# Patient Record
Sex: Female | Born: 1960
Health system: Southern US, Community
[De-identification: ages and names within clinical notes are randomized; demographics above are authoritative.]

## PROBLEM LIST (undated history)

## (undated) DIAGNOSIS — C539 Malignant neoplasm of cervix uteri, unspecified: Secondary | ICD-10-CM

## (undated) DIAGNOSIS — E039 Hypothyroidism, unspecified: Secondary | ICD-10-CM

## (undated) DIAGNOSIS — N200 Calculus of kidney: Secondary | ICD-10-CM

## (undated) DIAGNOSIS — K635 Polyp of colon: Secondary | ICD-10-CM

## (undated) DIAGNOSIS — F411 Generalized anxiety disorder: Secondary | ICD-10-CM

## (undated) DIAGNOSIS — K219 Gastro-esophageal reflux disease without esophagitis: Secondary | ICD-10-CM

## (undated) DIAGNOSIS — J301 Allergic rhinitis due to pollen: Secondary | ICD-10-CM

## (undated) DIAGNOSIS — J4540 Moderate persistent asthma, uncomplicated: Secondary | ICD-10-CM

## (undated) DIAGNOSIS — G4700 Insomnia, unspecified: Secondary | ICD-10-CM

## (undated) DIAGNOSIS — F1290 Cannabis use, unspecified, uncomplicated: Secondary | ICD-10-CM

## (undated) DIAGNOSIS — F17200 Nicotine dependence, unspecified, uncomplicated: Secondary | ICD-10-CM

## (undated) DIAGNOSIS — K589 Irritable bowel syndrome without diarrhea: Secondary | ICD-10-CM

## (undated) DIAGNOSIS — L409 Psoriasis, unspecified: Secondary | ICD-10-CM

## (undated) DIAGNOSIS — K2900 Acute gastritis without bleeding: Secondary | ICD-10-CM

## (undated) DIAGNOSIS — I10 Essential (primary) hypertension: Secondary | ICD-10-CM

## (undated) HISTORY — DX: Moderate persistent asthma, uncomplicated: J45.40

## (undated) HISTORY — DX: Allergic rhinitis due to pollen: J30.1

## (undated) HISTORY — DX: Psoriasis, unspecified: L40.9

## (undated) HISTORY — DX: Essential (primary) hypertension: I10

## (undated) HISTORY — PX: APPENDECTOMY: SHX54

## (undated) HISTORY — DX: Insomnia, unspecified: G47.00

## (undated) HISTORY — DX: Polyp of colon: K63.5

## (undated) HISTORY — DX: Gastro-esophageal reflux disease without esophagitis: K21.9

## (undated) HISTORY — DX: Nicotine dependence, unspecified, uncomplicated: F17.200

## (undated) HISTORY — PX: KNEE SURGERY: SHX244

## (undated) HISTORY — PX: TUBAL LIGATION: SHX77

## (undated) HISTORY — DX: Irritable bowel syndrome, unspecified: K58.9

## (undated) HISTORY — DX: Malignant neoplasm of cervix uteri, unspecified: C53.9

## (undated) HISTORY — PX: OTHER SURGICAL HISTORY: SHX169

## (undated) HISTORY — DX: Generalized anxiety disorder: F41.1

## (undated) HISTORY — DX: Cannabis use, unspecified, uncomplicated: F12.90

## (undated) HISTORY — DX: Acute gastritis without bleeding: K29.00

## (undated) HISTORY — PX: TONSILLECTOMY: SUR1361

## (undated) HISTORY — DX: Calculus of kidney: N20.0

---

## 1998-03-17 ENCOUNTER — Ambulatory Visit (HOSPITAL_COMMUNITY): Admission: RE | Admit: 1998-03-17 | Discharge: 1998-03-17 | Payer: Self-pay | Admitting: Obstetrics and Gynecology

## 2000-11-27 ENCOUNTER — Other Ambulatory Visit: Admission: RE | Admit: 2000-11-27 | Discharge: 2000-11-27 | Payer: Self-pay | Admitting: Obstetrics and Gynecology

## 2000-12-25 ENCOUNTER — Encounter (INDEPENDENT_AMBULATORY_CARE_PROVIDER_SITE_OTHER): Payer: Self-pay | Admitting: *Deleted

## 2000-12-25 ENCOUNTER — Inpatient Hospital Stay (HOSPITAL_COMMUNITY): Admission: RE | Admit: 2000-12-25 | Discharge: 2000-12-28 | Payer: Self-pay | Admitting: Obstetrics and Gynecology

## 2001-11-05 ENCOUNTER — Emergency Department (HOSPITAL_COMMUNITY): Admission: EM | Admit: 2001-11-05 | Discharge: 2001-11-05 | Payer: Self-pay | Admitting: Emergency Medicine

## 2002-04-08 ENCOUNTER — Encounter: Payer: Self-pay | Admitting: Emergency Medicine

## 2002-04-08 ENCOUNTER — Emergency Department (HOSPITAL_COMMUNITY): Admission: EM | Admit: 2002-04-08 | Discharge: 2002-04-08 | Payer: Self-pay

## 2002-08-12 ENCOUNTER — Emergency Department (HOSPITAL_COMMUNITY): Admission: EM | Admit: 2002-08-12 | Discharge: 2002-08-12 | Payer: Self-pay | Admitting: Emergency Medicine

## 2002-08-16 ENCOUNTER — Emergency Department (HOSPITAL_COMMUNITY): Admission: AD | Admit: 2002-08-16 | Discharge: 2002-08-16 | Payer: Self-pay | Admitting: Emergency Medicine

## 2002-08-17 ENCOUNTER — Emergency Department (HOSPITAL_COMMUNITY): Admission: EM | Admit: 2002-08-17 | Discharge: 2002-08-17 | Payer: Self-pay | Admitting: Emergency Medicine

## 2002-08-25 ENCOUNTER — Emergency Department (HOSPITAL_COMMUNITY): Admission: EM | Admit: 2002-08-25 | Discharge: 2002-08-25 | Payer: Self-pay | Admitting: Emergency Medicine

## 2002-10-06 ENCOUNTER — Emergency Department (HOSPITAL_COMMUNITY): Admission: EM | Admit: 2002-10-06 | Discharge: 2002-10-06 | Payer: Self-pay | Admitting: Emergency Medicine

## 2002-10-10 ENCOUNTER — Emergency Department (HOSPITAL_COMMUNITY): Admission: AD | Admit: 2002-10-10 | Discharge: 2002-10-10 | Payer: Self-pay | Admitting: Emergency Medicine

## 2002-10-12 ENCOUNTER — Emergency Department (HOSPITAL_COMMUNITY): Admission: EM | Admit: 2002-10-12 | Discharge: 2002-10-12 | Payer: Self-pay | Admitting: Emergency Medicine

## 2003-02-18 ENCOUNTER — Emergency Department (HOSPITAL_COMMUNITY): Admission: EM | Admit: 2003-02-18 | Discharge: 2003-02-18 | Payer: Self-pay | Admitting: Emergency Medicine

## 2003-09-19 ENCOUNTER — Emergency Department (HOSPITAL_COMMUNITY): Admission: EM | Admit: 2003-09-19 | Discharge: 2003-09-19 | Payer: Self-pay | Admitting: Emergency Medicine

## 2005-11-15 IMAGING — CR DG CHEST 2V
2 series · 2 of 2 positions shown · non-contrast
Comparison: none

HISTORY: Left chest and epigastric pain, dyspnea, smoker

CHEST 2 VIEWS:
No prior exam available for comparison.
Normal heart size, mediastinal contours, and pulmonary vascularity.
Lungs clear.
No pleural effusion or pneumothorax.
No acute bone abnormalities.
Question mild bony demineralization.

[w chest pa]
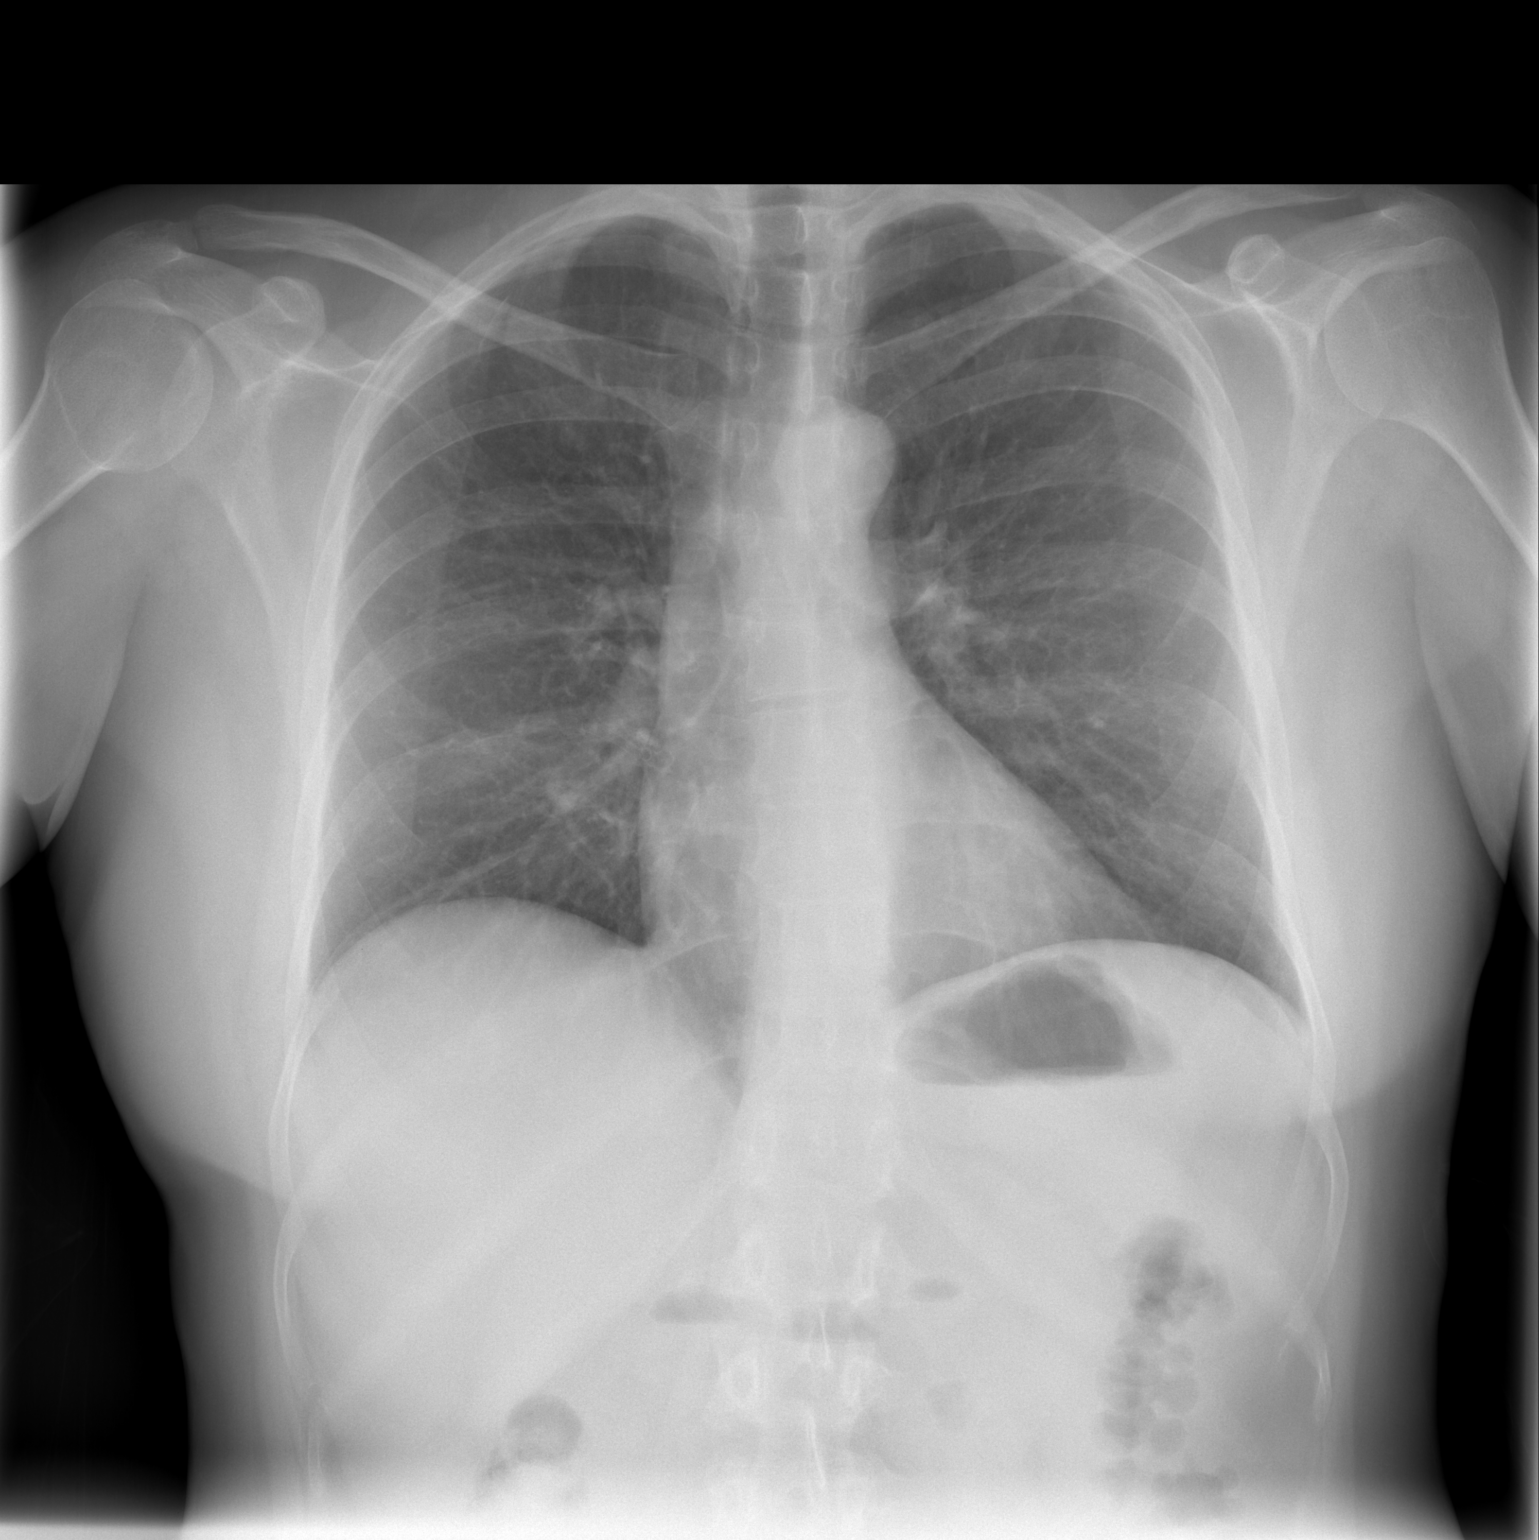

[w chest lat]
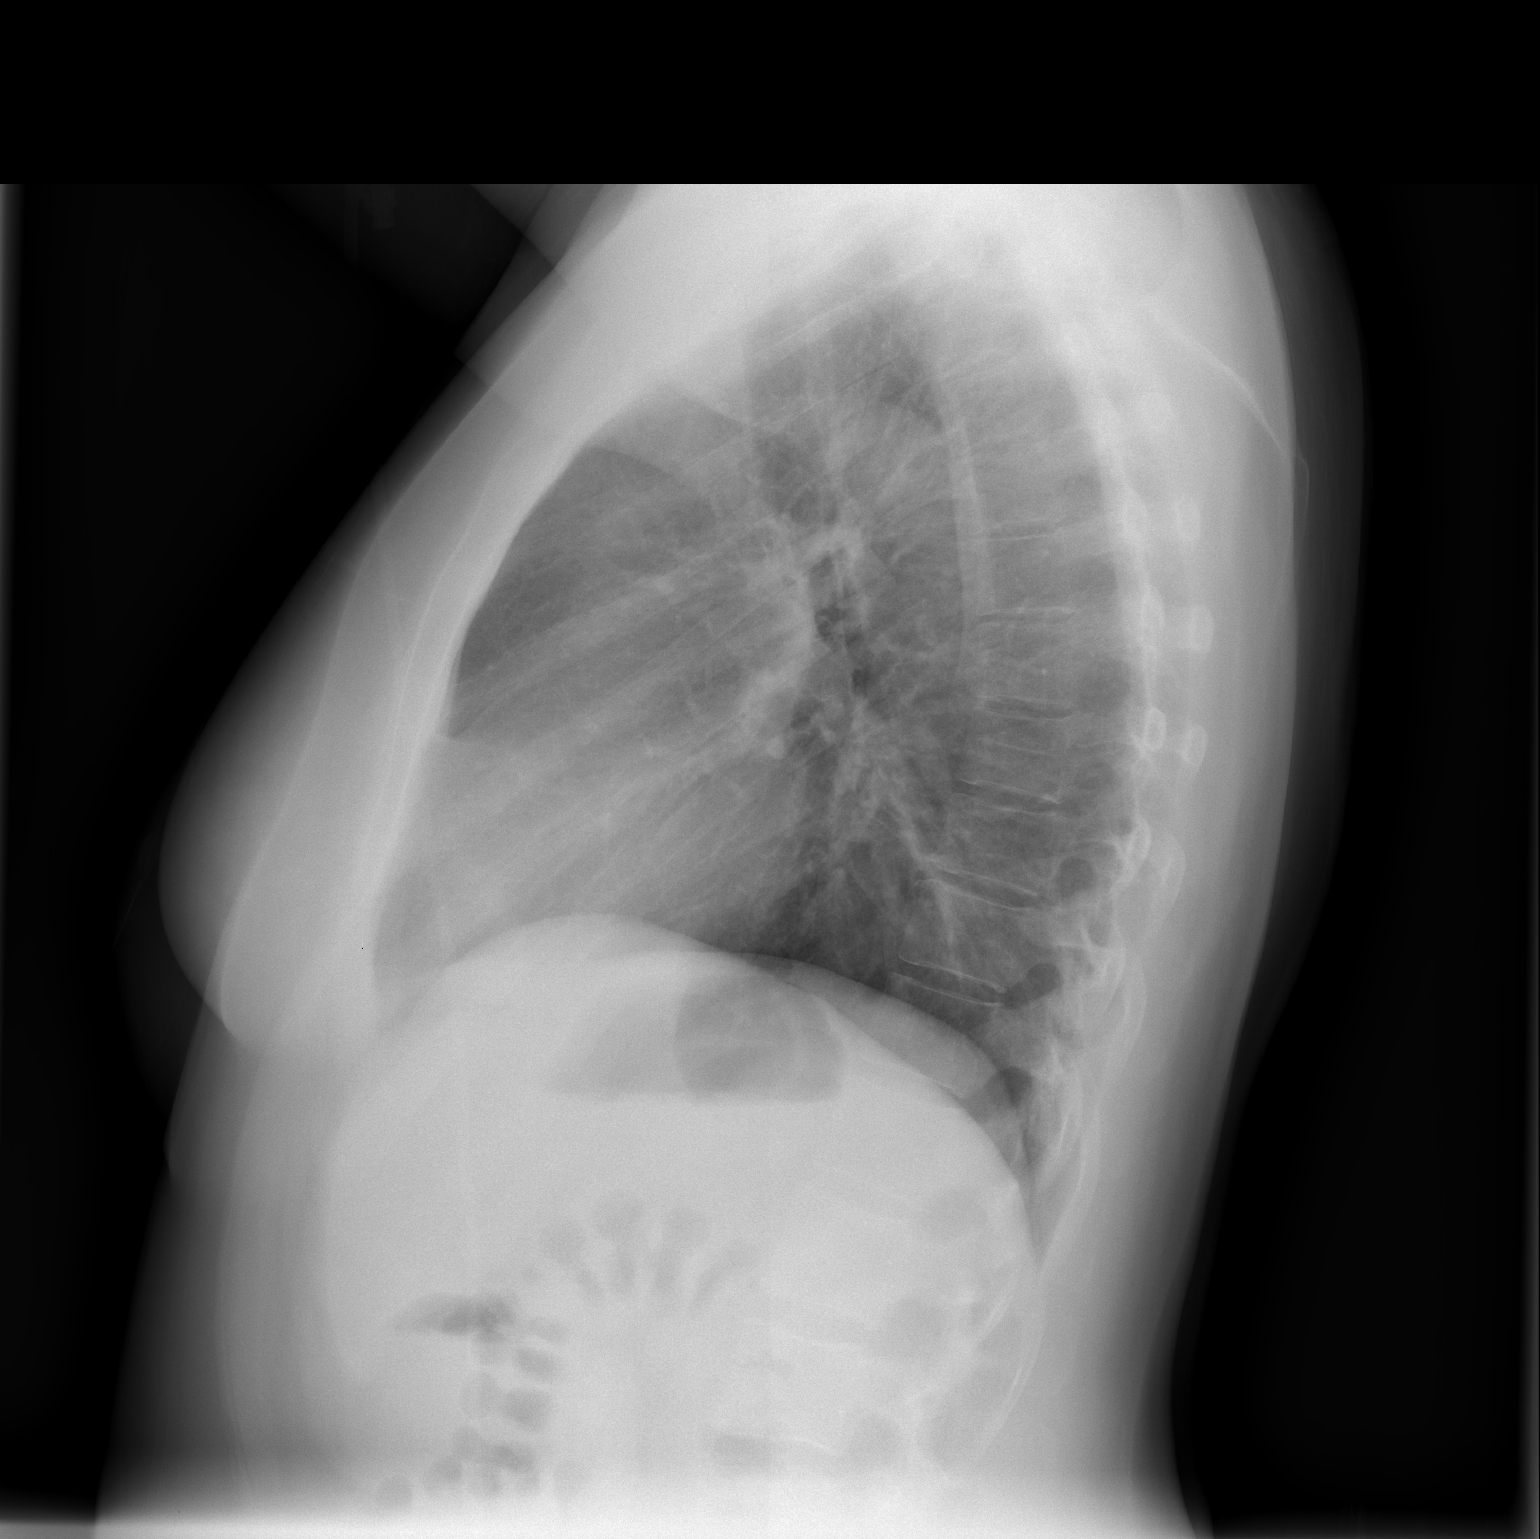

[2 of 2 positions shown; findings below may reference images not displayed]

IMPRESSION: No acute abnormalities.

## 2006-09-23 ENCOUNTER — Observation Stay (HOSPITAL_COMMUNITY): Admission: EM | Admit: 2006-09-23 | Discharge: 2006-09-23 | Payer: Self-pay | Admitting: Emergency Medicine

## 2006-11-24 ENCOUNTER — Emergency Department (HOSPITAL_COMMUNITY): Admission: EM | Admit: 2006-11-24 | Discharge: 2006-11-24 | Payer: Self-pay | Admitting: Emergency Medicine
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
## 2007-03-17 ENCOUNTER — Emergency Department (HOSPITAL_COMMUNITY): Admission: EM | Admit: 2007-03-17 | Discharge: 2007-03-17 | Payer: Self-pay | Admitting: Family Medicine

## 2008-05-09 ENCOUNTER — Emergency Department (HOSPITAL_COMMUNITY): Admission: EM | Admit: 2008-05-09 | Discharge: 2008-05-09 | Payer: Self-pay | Admitting: Emergency Medicine

## 2008-05-09 IMAGING — CT CT ABDOMEN W/ CM
2 of 5 series · 17 of 46 positions shown, 19 images · IV contrast (water/omni  & 100 ML OMNI 300)
Comparison: [DATE] by report only

CT ABDOMEN

CLINICAL DATA: Abdominal pain, diarrhea

CT ABDOMEN AND PELVIS WITH CONTRAST
TECHNIQUE: Multidetector CT imaging of the abdomen and pelvis was
performed using the standard protocol following bolus
administration of intravenous contrast.
Contrast: 100 ml [9H] IV

[Series 2: routine abdomen · axial · 0.84mm/px · z∈[-470,-96]mm · 14 of 85 slices shown, 16 images]
[im 5/85  soft-tissue]
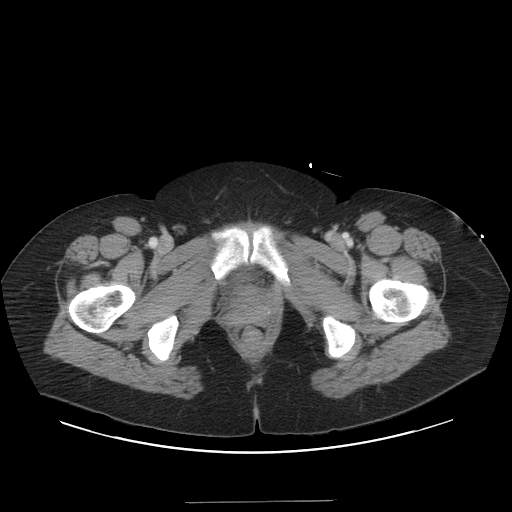
[im 5/85  bone]
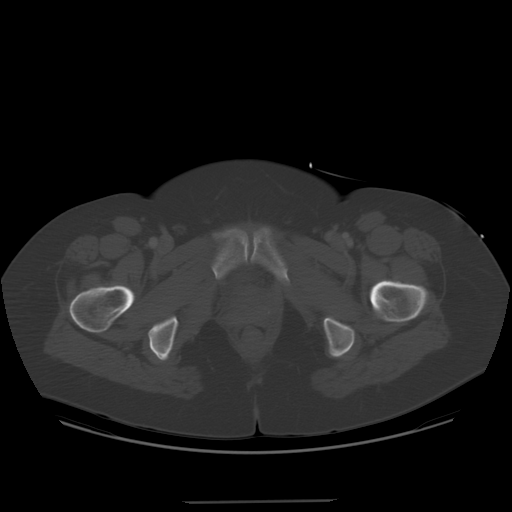
[im 10/85  soft-tissue]
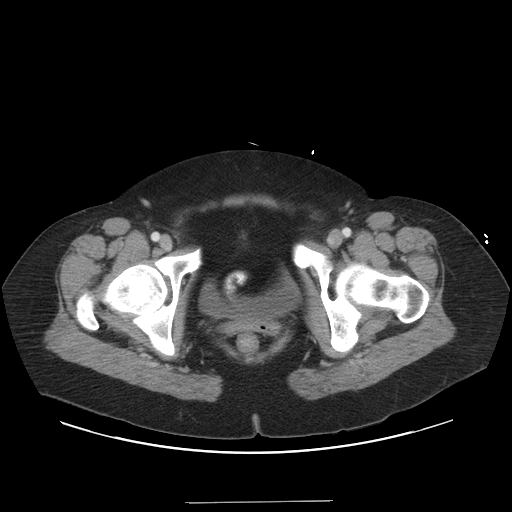
[im 19/85  soft-tissue]
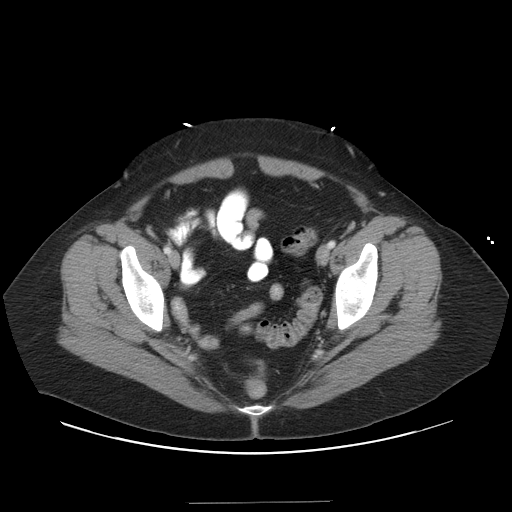
[im 24/85  soft-tissue]
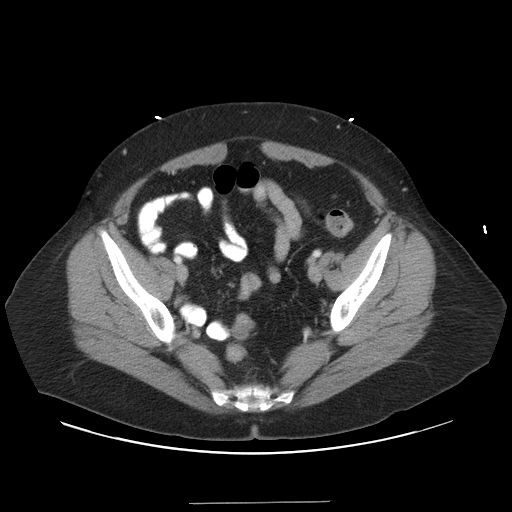
[im 29/85  soft-tissue]
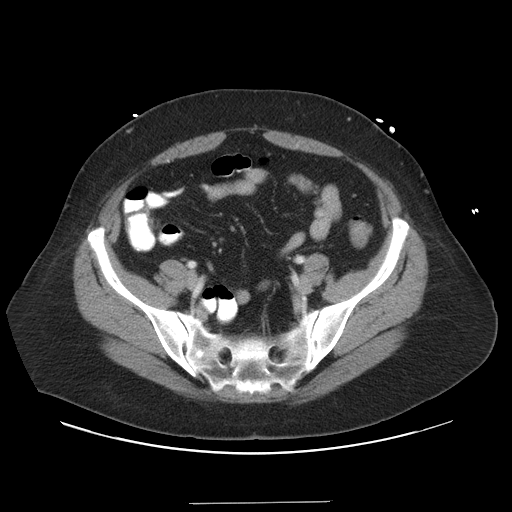
[im 33/85  soft-tissue]
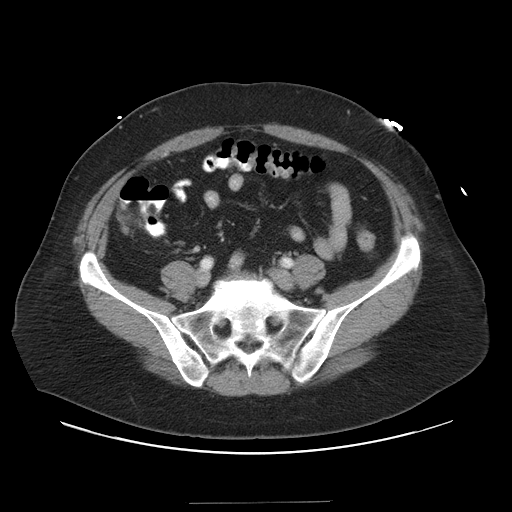
[im 38/85  soft-tissue]
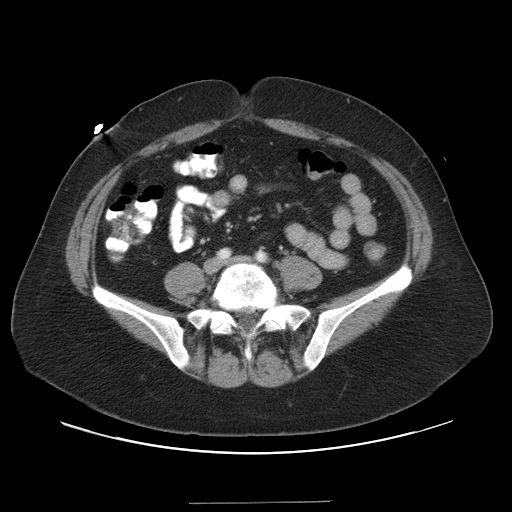
[im 47/85  soft-tissue]
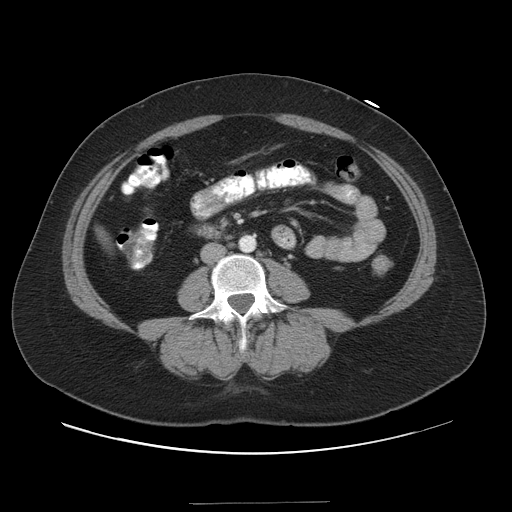
[im 52/85  soft-tissue]
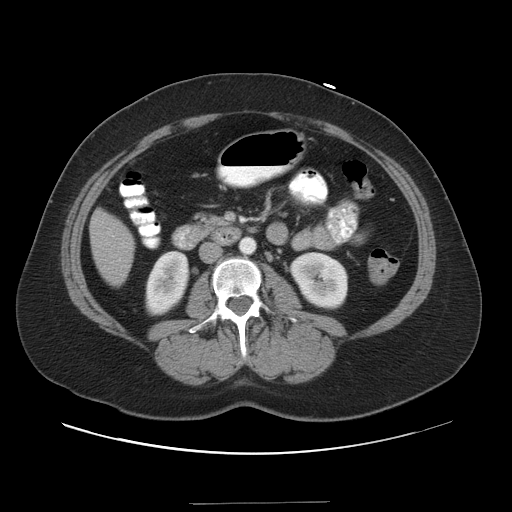
[im 52/85  bone]
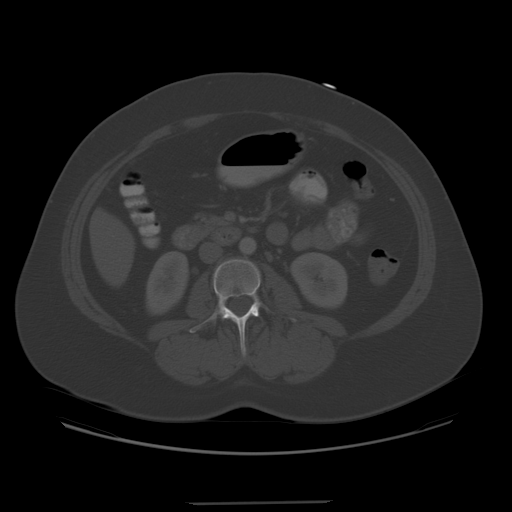
[im 57/85  soft-tissue]
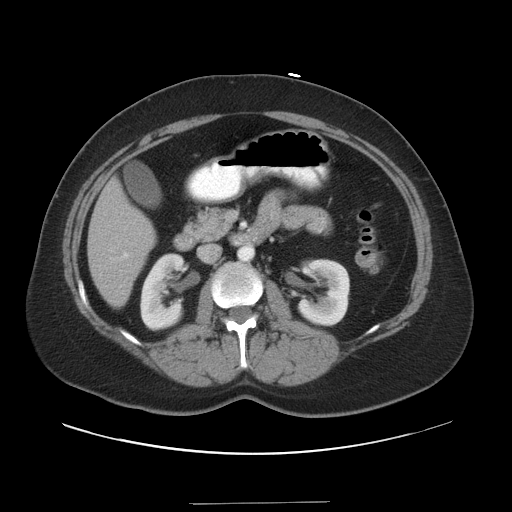
[im 61/85  soft-tissue]
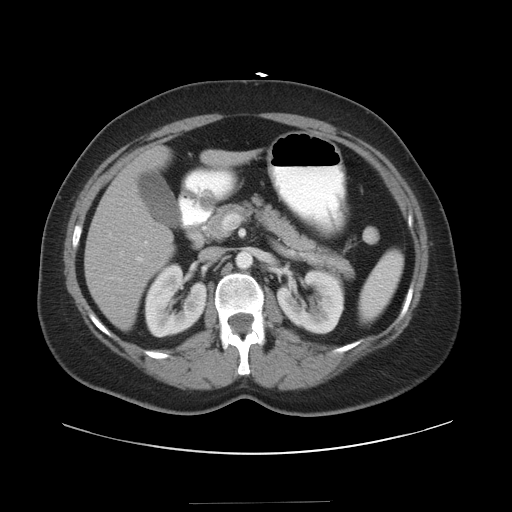
[im 66/85  soft-tissue]
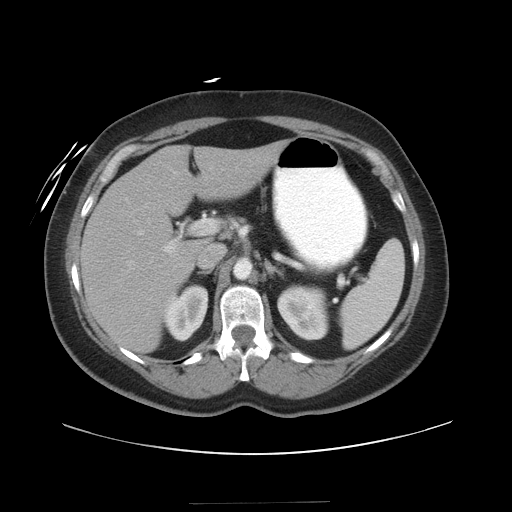
[im 75/85  soft-tissue]
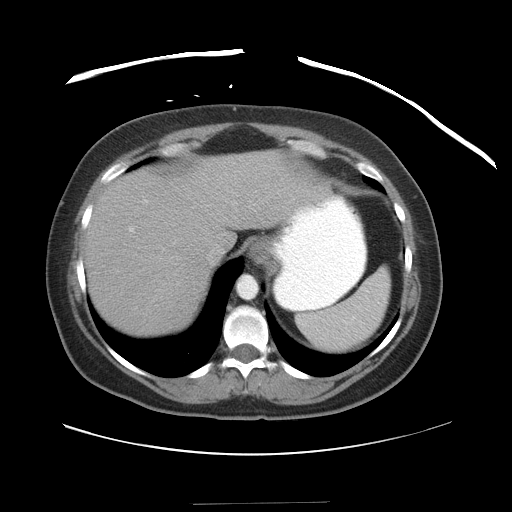
[im 80/85  soft-tissue]
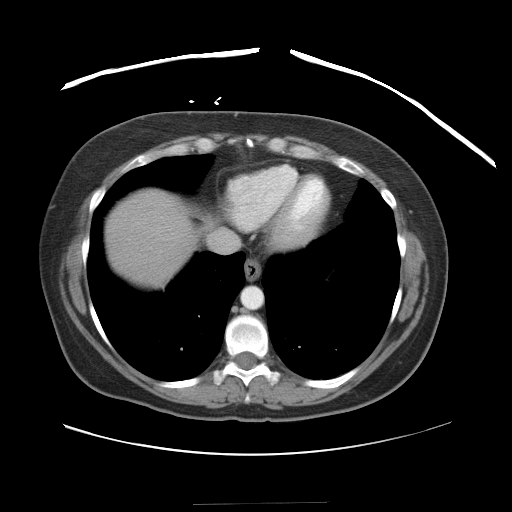

[Series 401: coronal · coronal · 0.84mm/px · 3 of 88 slices shown]
[im 30/88  soft-tissue]
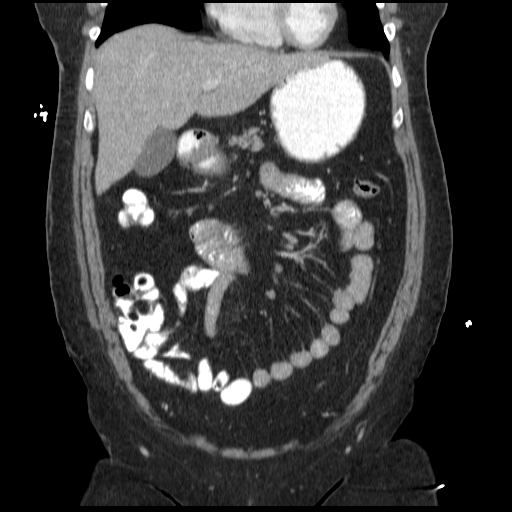
[im 39/88  soft-tissue]
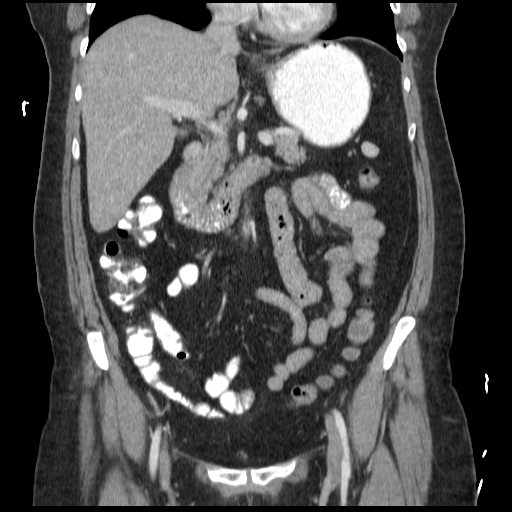
[im 49/88  soft-tissue]
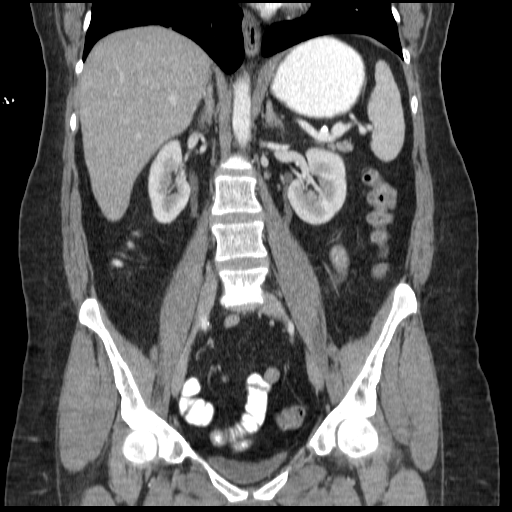

[17 of 46 positions shown; findings below may reference images not displayed]

FINDINGS: Visualized lung bases clear.  Unremarkable liver,
gallbladder, spleen and accessory splenule, adrenal glands,
kidneys, pancreas, abdominal aorta, small bowel.  A few sub
centimeter central mesenteric, left para-aortic, and aortocaval
lymph nodes.  Portal vein patent.  No free air.  No ascites.  No
hydronephrosis.  Degenerative disc disease L5-S1.
IMPRESSION: 1.  No acute abdominal process.
2.  Degenerative disc disease L5-S1.

CT PELVIS
FINDINGS: Appendix surgically absent.  The colon is nondilated,
unremarkable.  Urinary bladder incompletely distended.  Previous
hysterectomy.  No free fluid.  No adenopathy.
IMPRESSION: 1.  No acute pelvic process.

## 2008-05-12 ENCOUNTER — Emergency Department (HOSPITAL_COMMUNITY): Admission: EM | Admit: 2008-05-12 | Discharge: 2008-05-13 | Payer: Self-pay | Admitting: Emergency Medicine

## 2008-05-12 IMAGING — CR DG ABDOMEN ACUTE W/ 1V CHEST
3 series · 3 of 3 positions shown · non-contrast
Comparison: Chest radiograph [DATE]

CLINICAL DATA: Abdominal pain, nausea, vomiting

ACUTE ABDOMEN SERIES (ABDOMEN 2 VIEW & CHEST 1 VIEW)

[w chest pa]
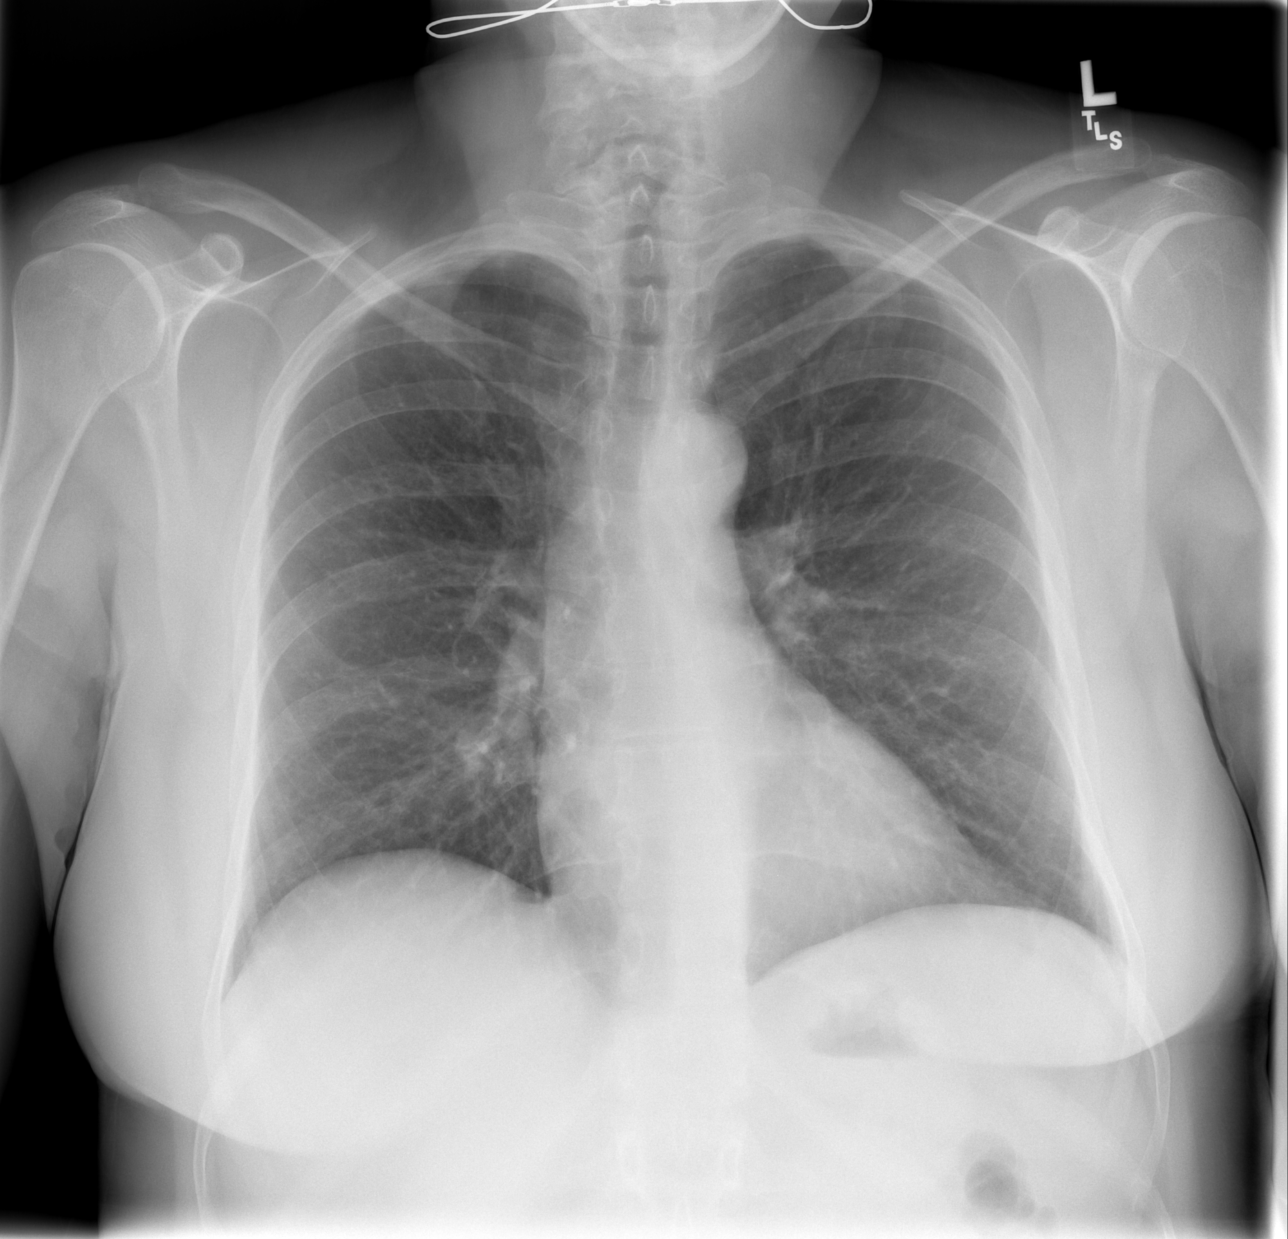

[w abdomen upright]
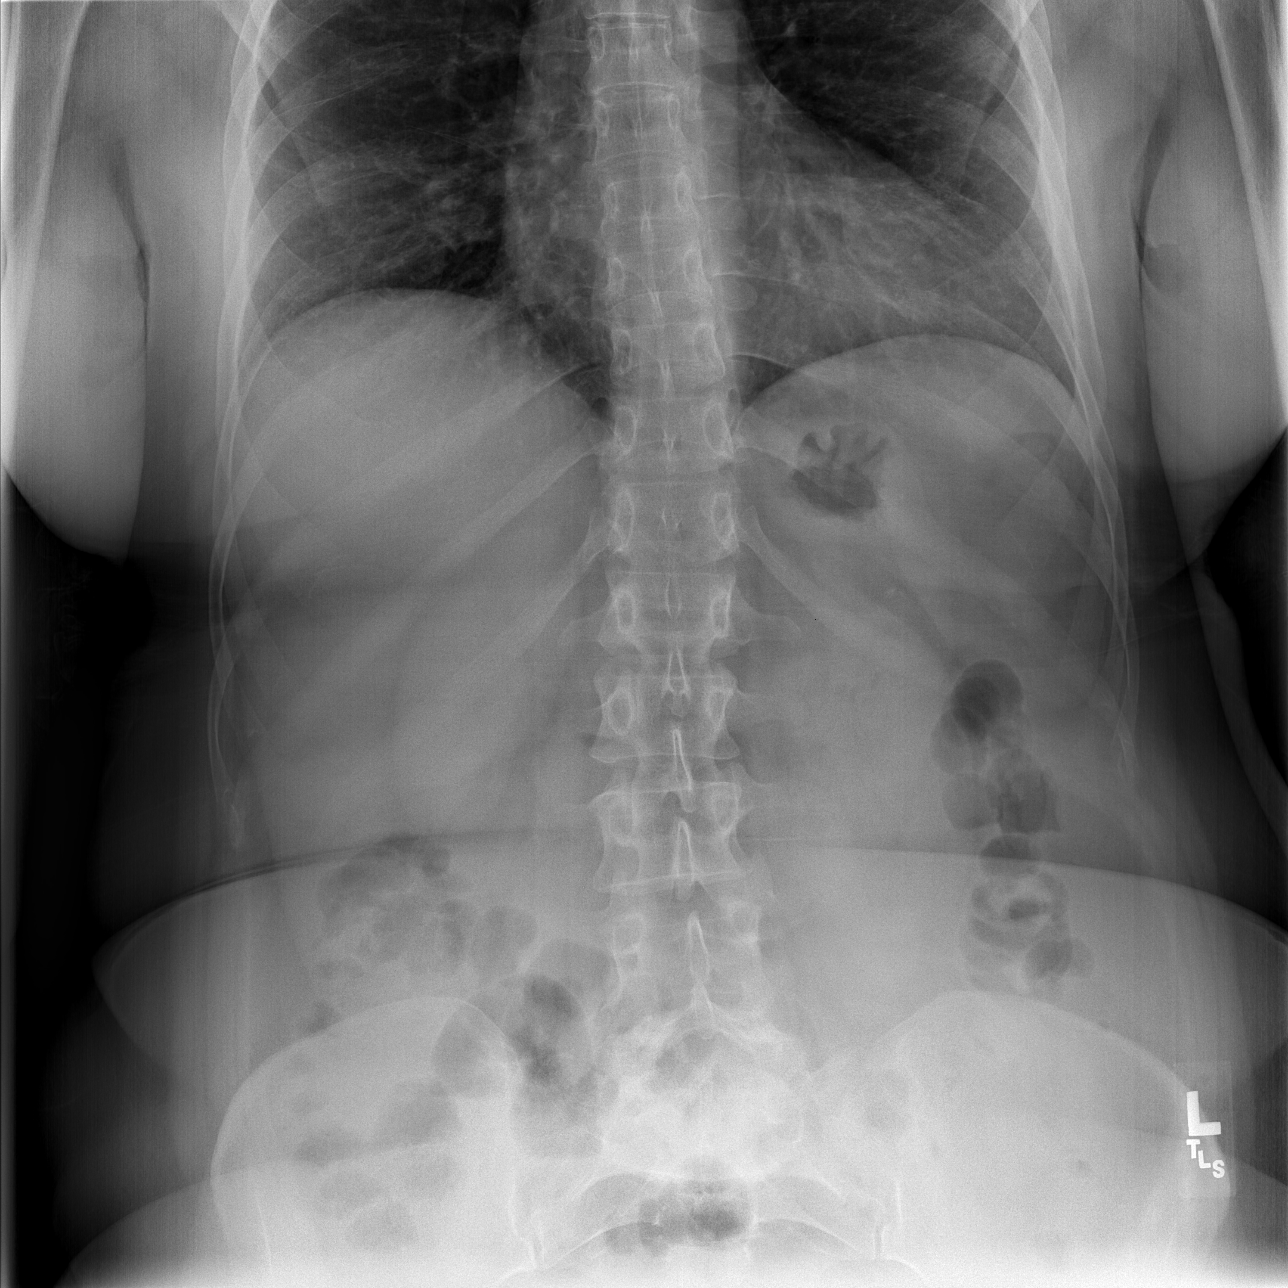

[t abdomen supine]
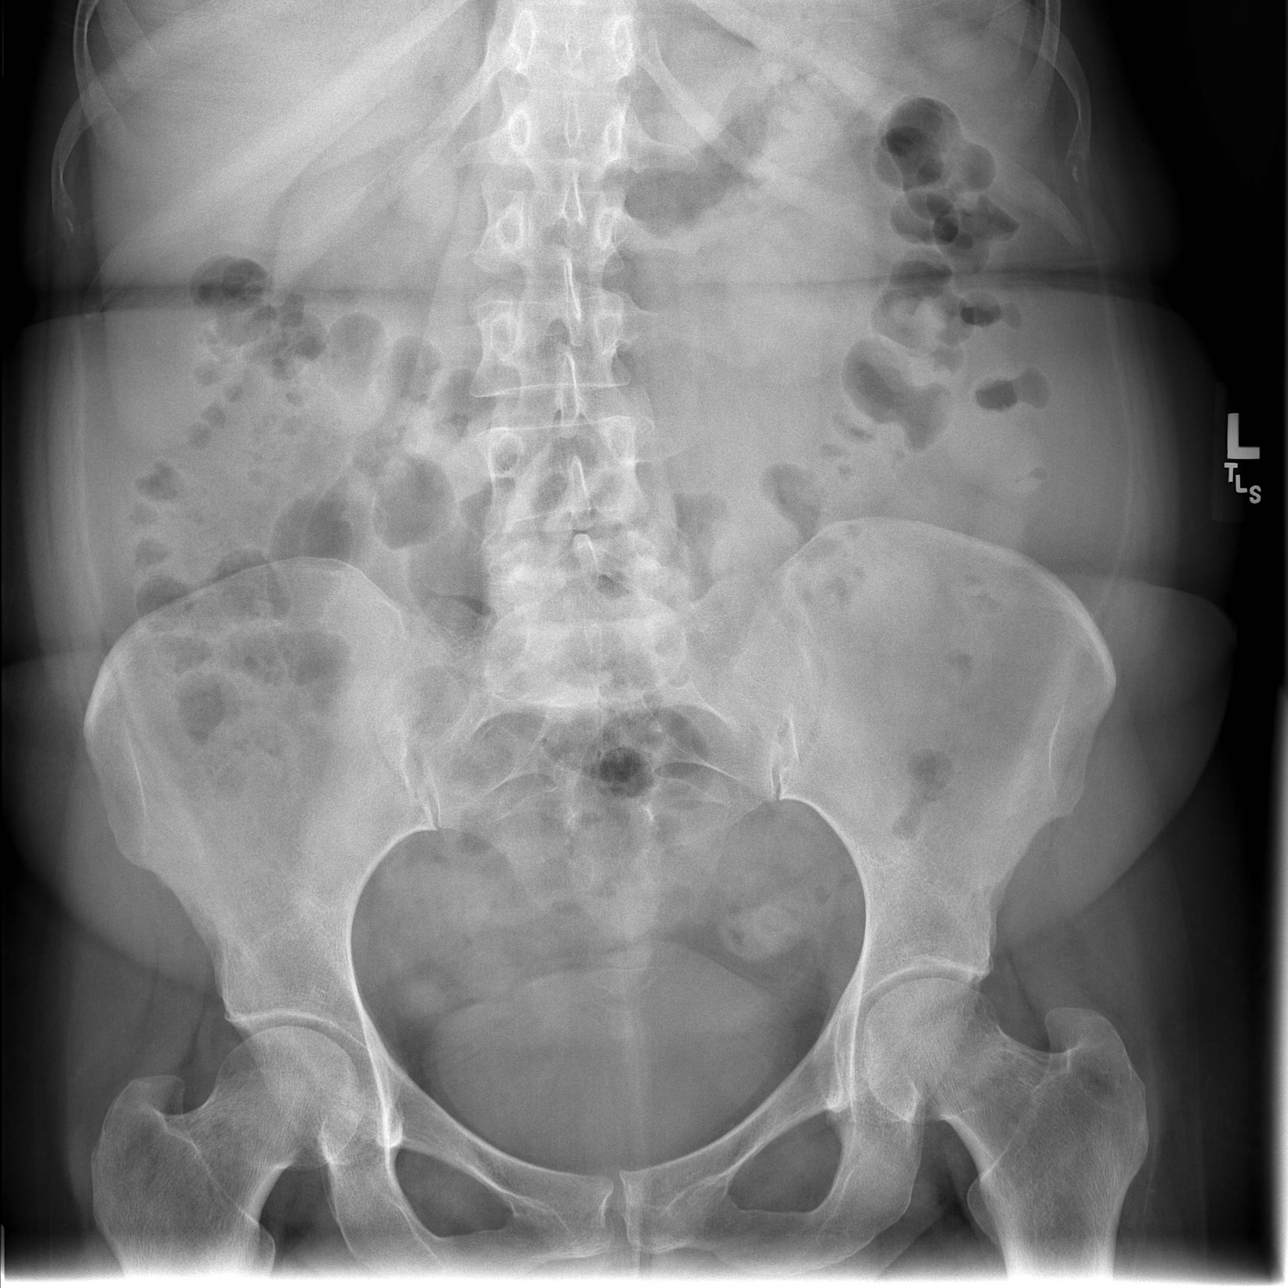

[3 of 3 positions shown; findings below may reference images not displayed]

FINDINGS: Normal heart size, mediastinal contours, and pulmonary vascularity.
Minimal chronic peribronchial thickening.
No pulmonary infiltrate or pleural effusion.
Normal bowel gas pattern.
No bowel dilatation, bowel wall thickening, or free air.
Mild bony demineralization with minimal scoliosis spine.
IMPRESSION: Minimal chronic bronchitic changes.
No acute abdominal findings.

## 2008-07-14 ENCOUNTER — Emergency Department (HOSPITAL_COMMUNITY): Admission: EM | Admit: 2008-07-14 | Discharge: 2008-07-14 | Payer: Self-pay | Admitting: Family Medicine

## 2009-02-18 HISTORY — PX: TOTAL ABDOMINAL HYSTERECTOMY W/ BILATERAL SALPINGOOPHORECTOMY: SHX83

## 2009-02-27 ENCOUNTER — Emergency Department (HOSPITAL_COMMUNITY): Admission: EM | Admit: 2009-02-27 | Discharge: 2009-02-27 | Payer: Self-pay | Admitting: Emergency Medicine

## 2009-02-27 IMAGING — CR DG KNEE COMPLETE 4+V*L*
4 series · 4 of 4 positions shown · non-contrast
Comparison: None

CLINICAL DATA: Pain without trauma

LEFT KNEE - COMPLETE 4+ VIEW

[t knee ap left]
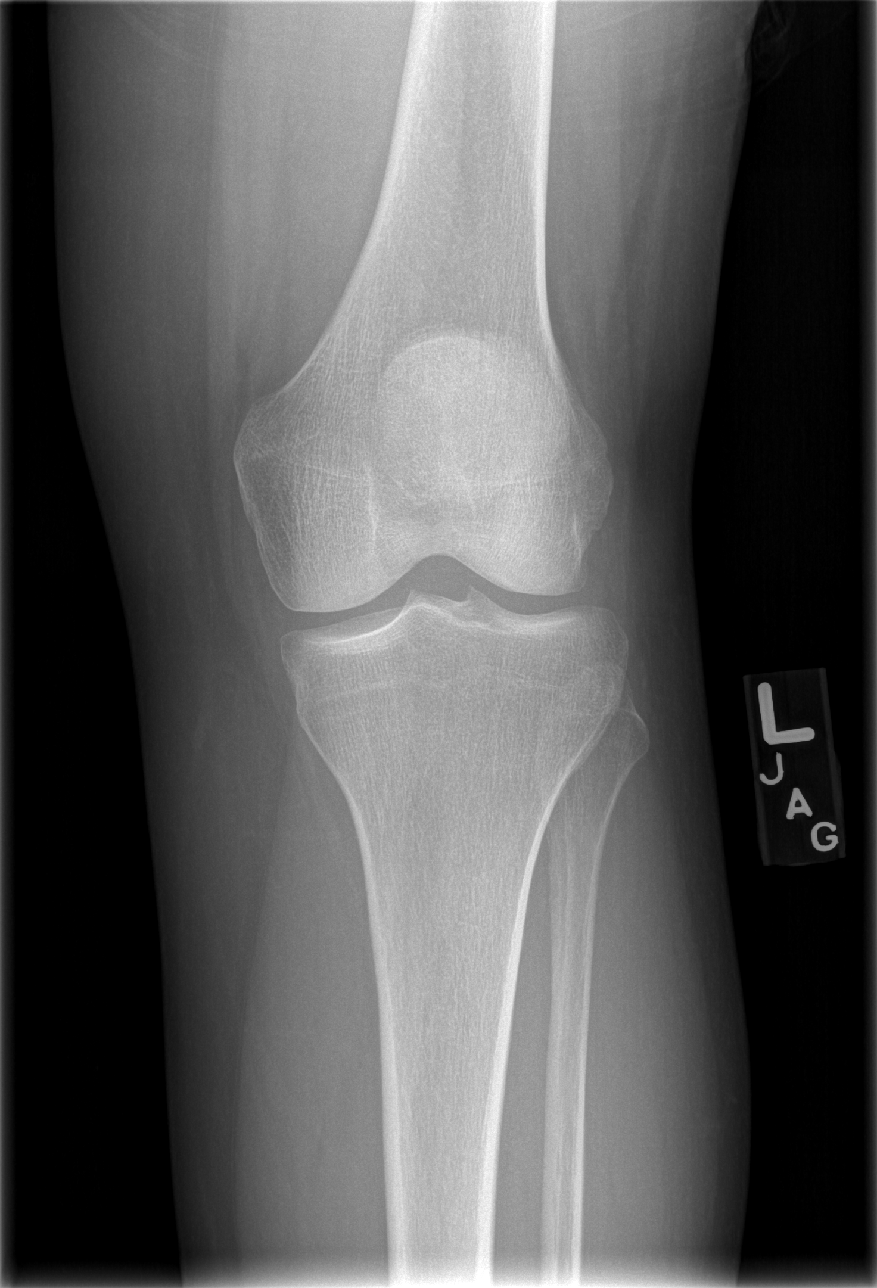

[t knee oblique left (1 of 2)]
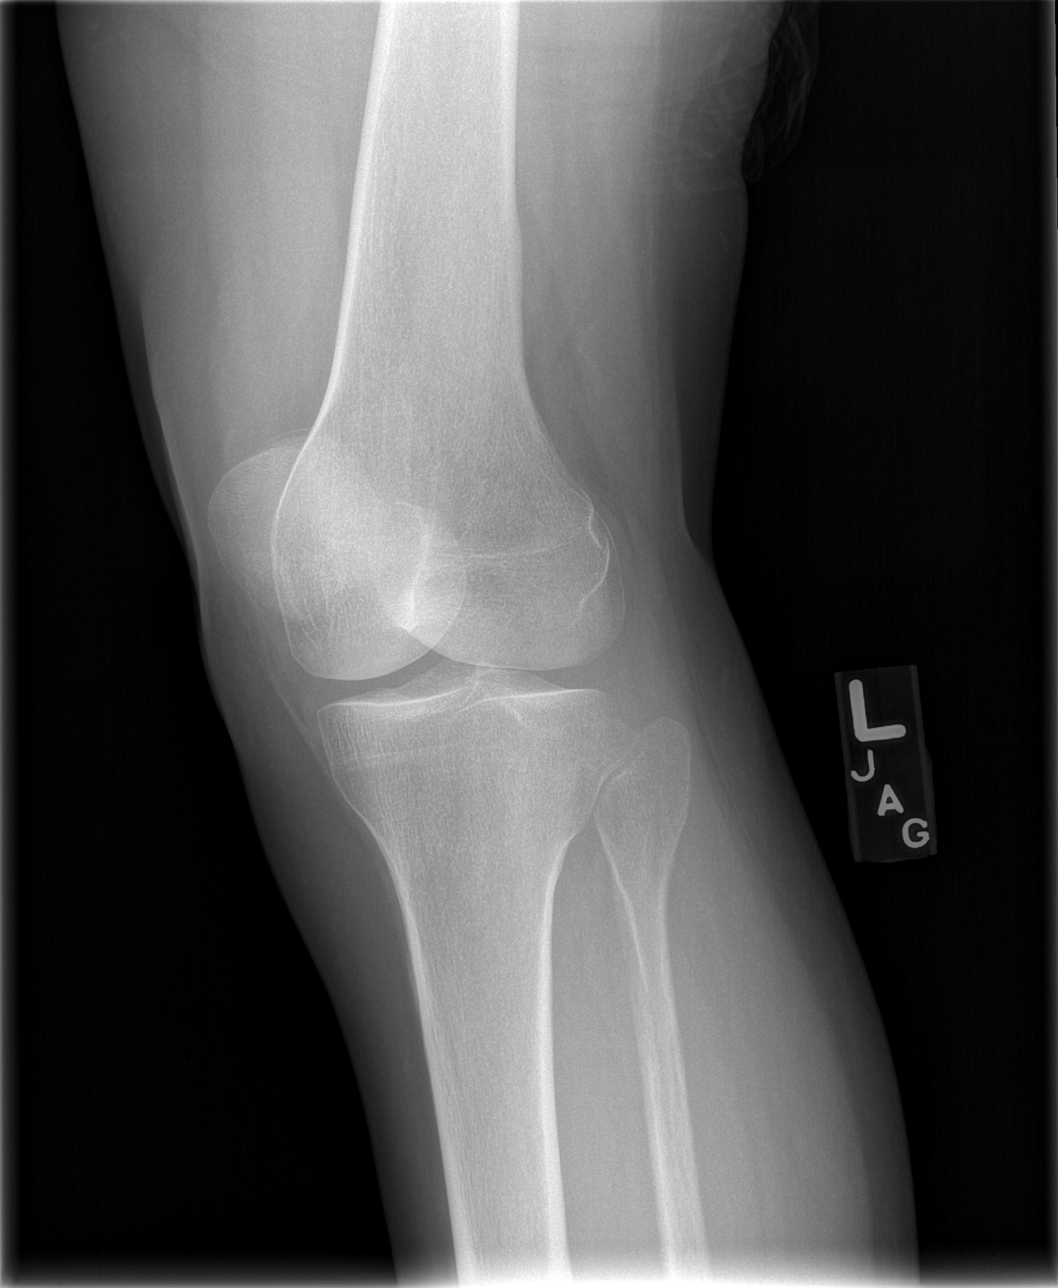

[t knee oblique left (2 of 2)]
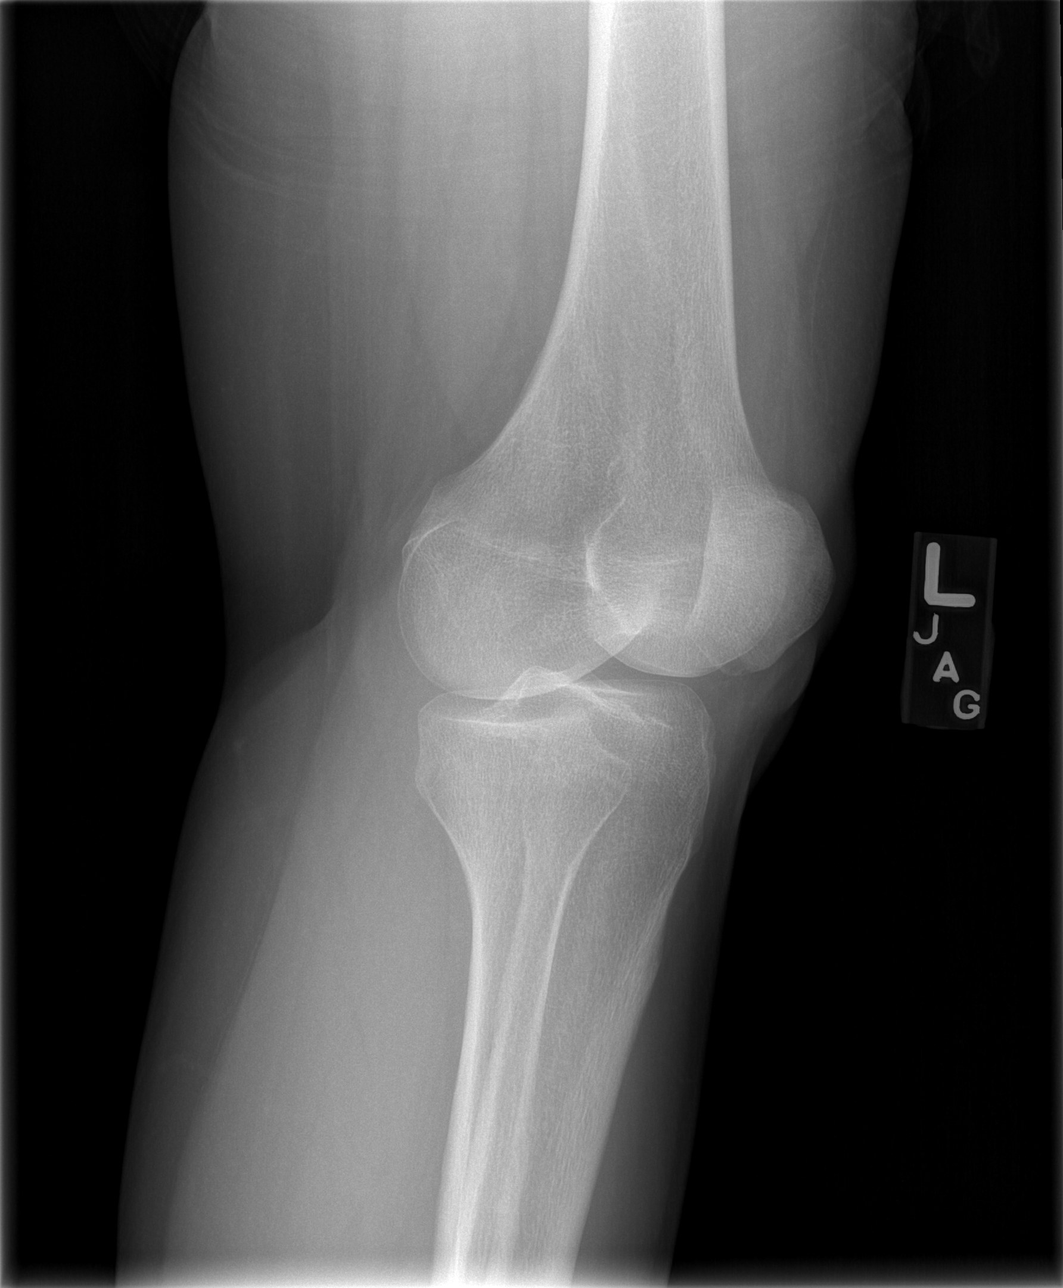

[t knee lat left]
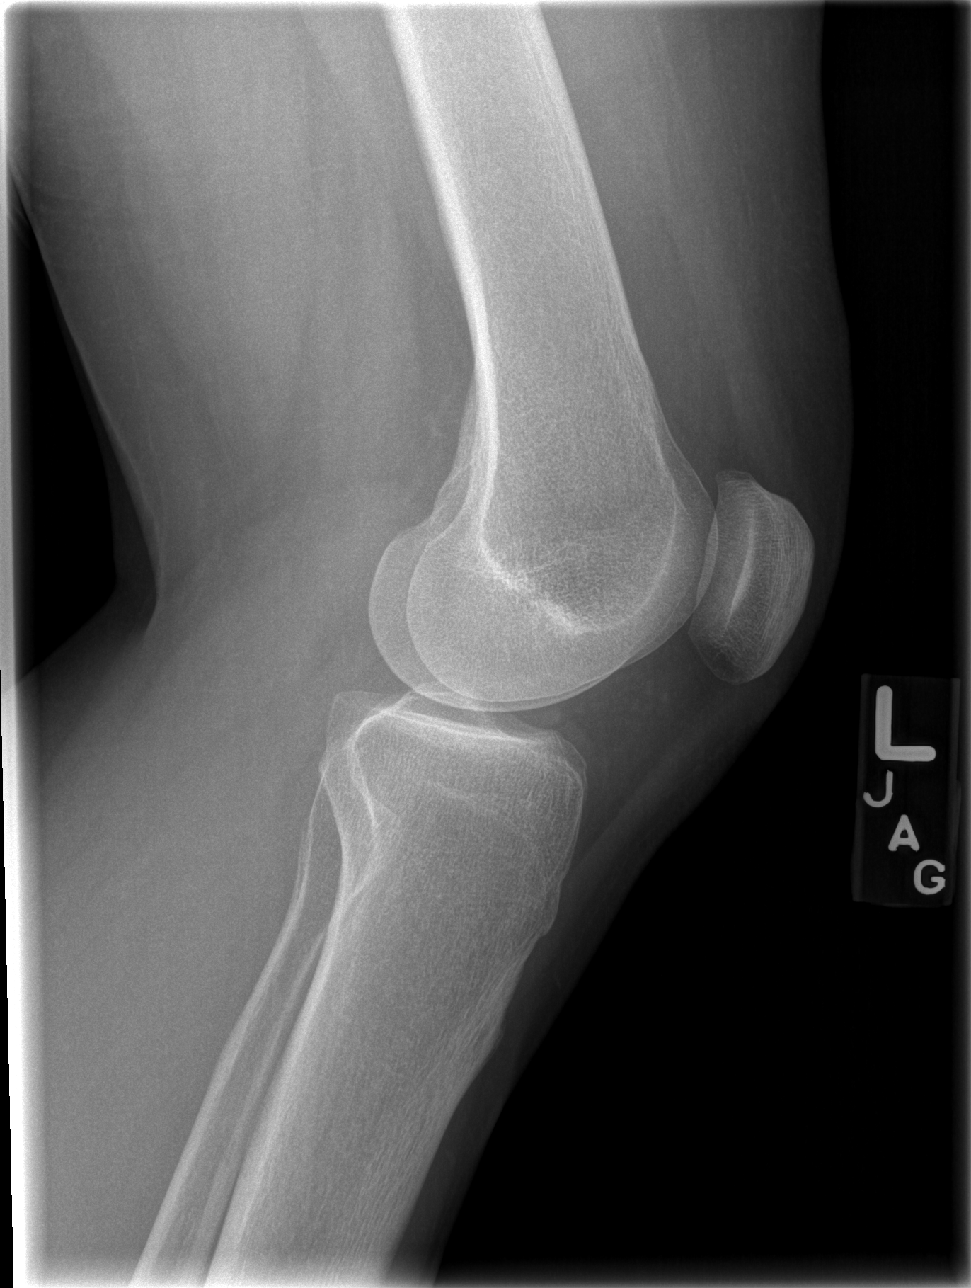

[4 of 4 positions shown; findings below may reference images not displayed]

FINDINGS: No effusion. Negative for fracture, dislocation, or other
acute abnormality.  Normal alignment and mineralization. No
significant degenerative change.  Regional soft tissues
unremarkable.
IMPRESSION: Negative

## 2010-03-26 ENCOUNTER — Emergency Department (HOSPITAL_COMMUNITY)
Admission: EM | Admit: 2010-03-26 | Discharge: 2010-03-26 | Disposition: A | Discharge status: Home / Self Care | Payer: Self-pay | Attending: Emergency Medicine | Admitting: Emergency Medicine

## 2010-03-26 ENCOUNTER — Emergency Department (HOSPITAL_COMMUNITY): Payer: Self-pay

## 2010-03-26 DIAGNOSIS — M51379 Other intervertebral disc degeneration, lumbosacral region without mention of lumbar back pain or lower extremity pain: Secondary | ICD-10-CM | POA: Insufficient documentation

## 2010-03-26 DIAGNOSIS — F329 Major depressive disorder, single episode, unspecified: Secondary | ICD-10-CM | POA: Insufficient documentation

## 2010-03-26 DIAGNOSIS — R109 Unspecified abdominal pain: Secondary | ICD-10-CM | POA: Insufficient documentation

## 2010-03-26 DIAGNOSIS — M545 Low back pain, unspecified: Secondary | ICD-10-CM | POA: Insufficient documentation

## 2010-03-26 DIAGNOSIS — M79609 Pain in unspecified limb: Secondary | ICD-10-CM | POA: Insufficient documentation

## 2010-03-26 DIAGNOSIS — M5137 Other intervertebral disc degeneration, lumbosacral region: Secondary | ICD-10-CM | POA: Insufficient documentation

## 2010-03-26 DIAGNOSIS — Z8541 Personal history of malignant neoplasm of cervix uteri: Secondary | ICD-10-CM | POA: Insufficient documentation

## 2010-03-26 DIAGNOSIS — K5900 Constipation, unspecified: Secondary | ICD-10-CM | POA: Insufficient documentation

## 2010-03-26 DIAGNOSIS — F3289 Other specified depressive episodes: Secondary | ICD-10-CM | POA: Insufficient documentation

## 2010-03-26 DIAGNOSIS — M25569 Pain in unspecified knee: Secondary | ICD-10-CM | POA: Insufficient documentation

## 2010-03-26 DIAGNOSIS — M5430 Sciatica, unspecified side: Secondary | ICD-10-CM | POA: Insufficient documentation

## 2010-03-26 DIAGNOSIS — R0602 Shortness of breath: Secondary | ICD-10-CM | POA: Insufficient documentation

## 2010-03-26 LAB — URINALYSIS, ROUTINE W REFLEX MICROSCOPIC
Bilirubin Urine: NEGATIVE
Hgb urine dipstick: NEGATIVE
Ketones, ur: NEGATIVE mg/dL
Nitrite: NEGATIVE
Protein, ur: NEGATIVE mg/dL
Specific Gravity, Urine: 1.01 (ref 1.005–1.030)
Urine Glucose, Fasting: NEGATIVE mg/dL
Urobilinogen, UA: 0.2 mg/dL (ref 0.0–1.0)
pH: 5.5 (ref 5.0–8.0)

## 2010-03-26 LAB — COMPREHENSIVE METABOLIC PANEL
ALT: 17 U/L (ref 0–35)
AST: 18 U/L (ref 0–37)
Albumin: 4.1 g/dL (ref 3.5–5.2)
Alkaline Phosphatase: 62 U/L (ref 39–117)
BUN: 11 mg/dL (ref 6–23)
CO2: 27 mEq/L (ref 19–32)
Calcium: 9.8 mg/dL (ref 8.4–10.5)
Chloride: 104 mEq/L (ref 96–112)
Creatinine, Ser: 0.71 mg/dL (ref 0.4–1.2)
GFR calc Af Amer: >60 mL/min (ref >60)
GFR calc non Af Amer: >60 mL/min (ref >60)
Glucose, Bld: 94 mg/dL (ref 70–99)
Potassium: 4 mEq/L (ref 3.5–5.1)
Sodium: 140 mEq/L (ref 135–145)
Total Bilirubin: 0.7 mg/dL (ref 0.3–1.2)
Total Protein: 7.1 g/dL (ref 6.0–8.3)

## 2010-03-26 LAB — CBC
HCT: 43.5 % (ref 36.0–46.0)
Hemoglobin: 14.9 g/dL (ref 12.0–15.0)
MCH: 28.7 pg (ref 26.0–34.0)
MCHC: 34.3 g/dL (ref 30.0–36.0)
MCV: 83.7 fL (ref 78.0–100.0)
Platelets: 255 10*3/uL (ref 150–400)
RBC: 5.2 MIL/uL — ABNORMAL HIGH (ref 3.87–5.11)
RDW: 13.5 % (ref 11.5–15.5)
WBC: 7.4 10*3/uL (ref 4.0–10.5)

## 2010-03-26 LAB — DIFFERENTIAL
Basophils Absolute: 0 10*3/uL (ref 0.0–0.1)
Basophils Relative: 1 % (ref 0–1)
Eosinophils Absolute: 0.3 10*3/uL (ref 0.0–0.7)
Eosinophils Relative: 4 % (ref 0–5)
Lymphocytes Relative: 32 % (ref 12–46)
Lymphs Abs: 2.4 10*3/uL (ref 0.7–4.0)
Monocytes Absolute: 0.4 10*3/uL (ref 0.1–1.0)
Monocytes Relative: 5 % (ref 3–12)
Neutro Abs: 4.4 10*3/uL (ref 1.7–7.7)
Neutrophils Relative %: 59 % (ref 43–77)

## 2010-03-26 IMAGING — CR DG ABDOMEN ACUTE W/ 1V CHEST
3 series · 3 of 3 positions shown · non-contrast
Comparison: [DATE]

CLINICAL DATA: Mid abdominal pain/short of breath

ACUTE ABDOMEN SERIES (ABDOMEN 2 VIEW & CHEST 1 VIEW)

[w chest pa]
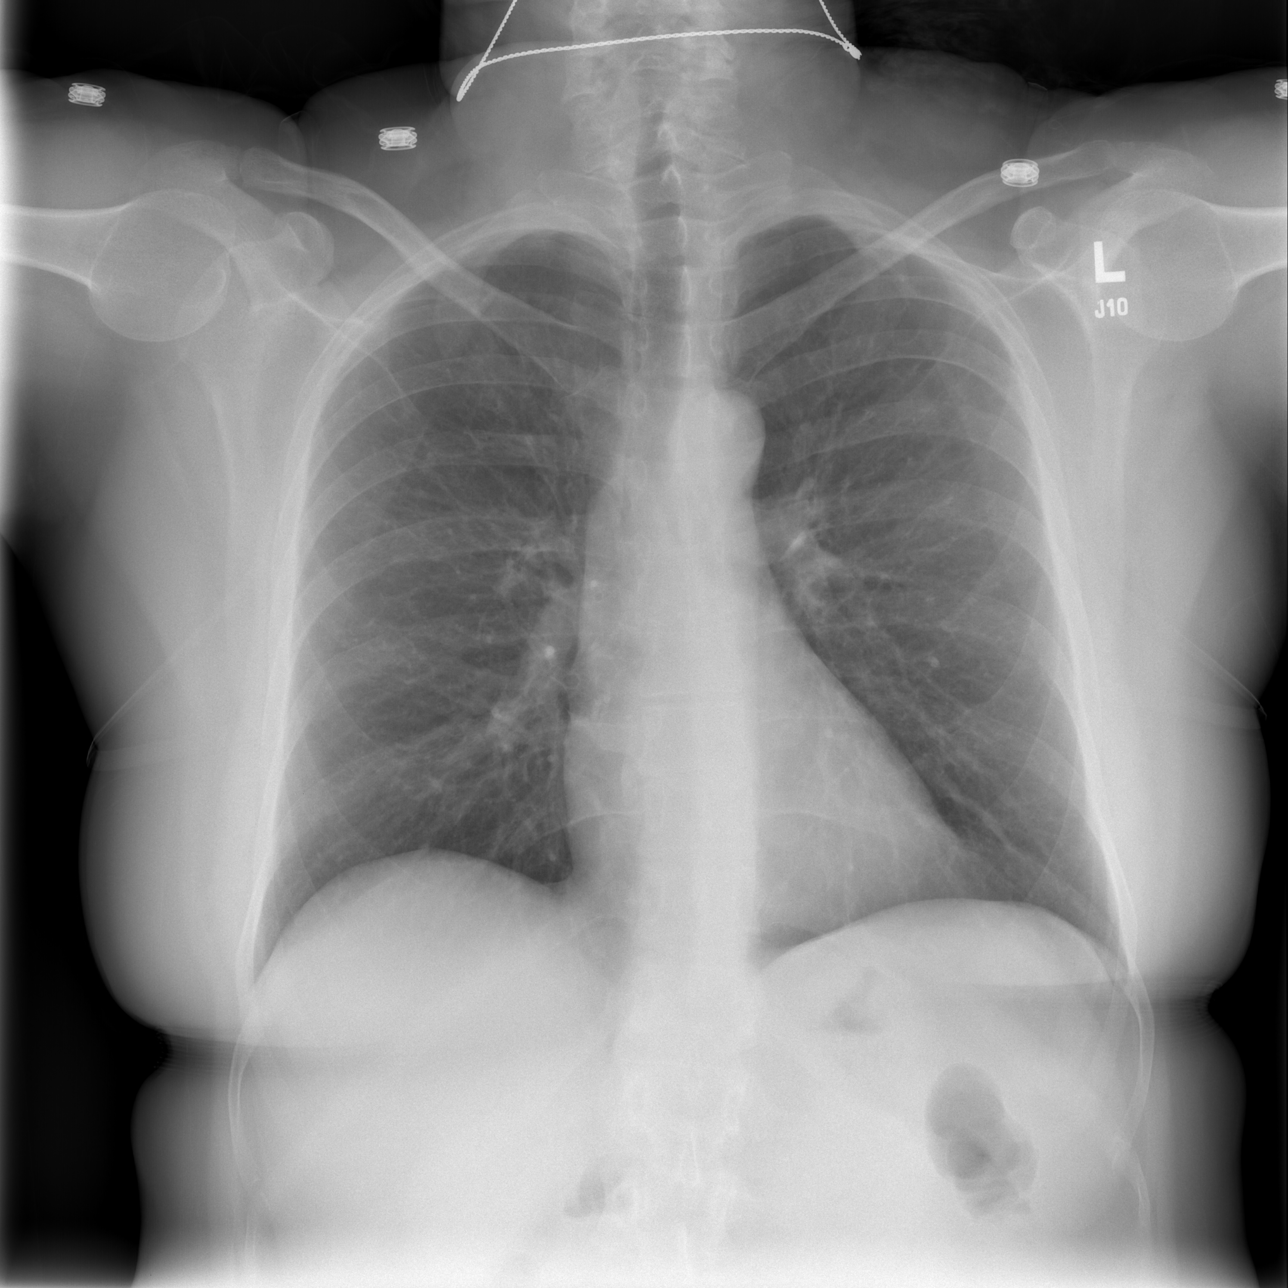

[w abdomen upright]
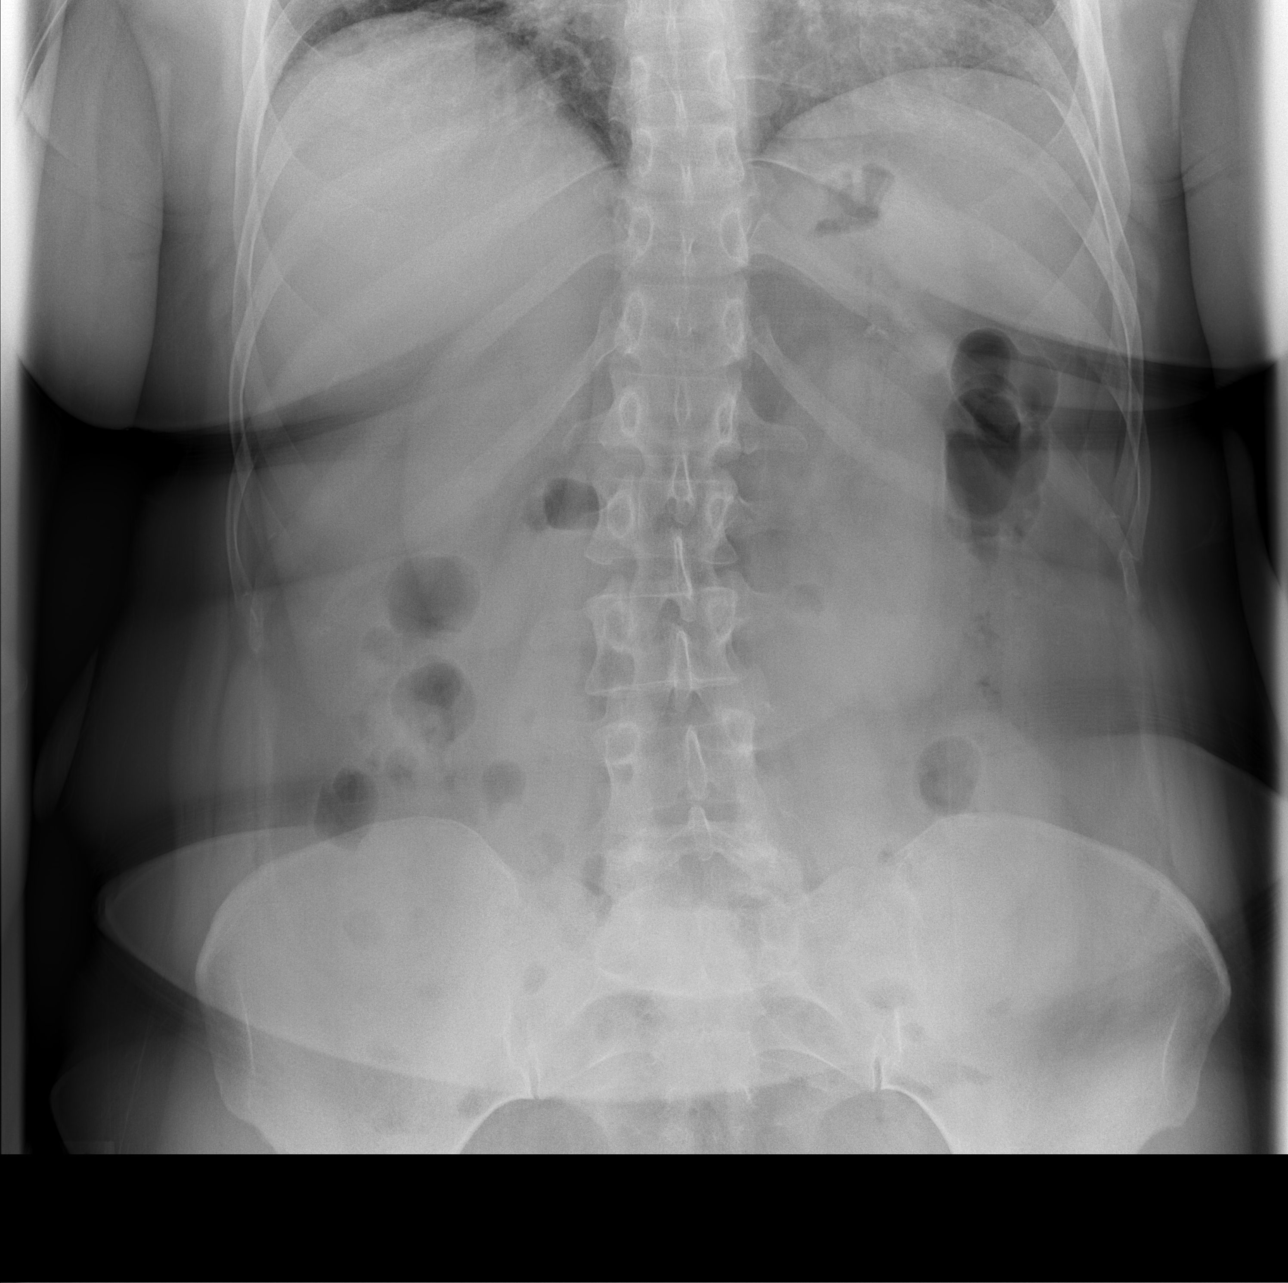

[t abdomen supine]
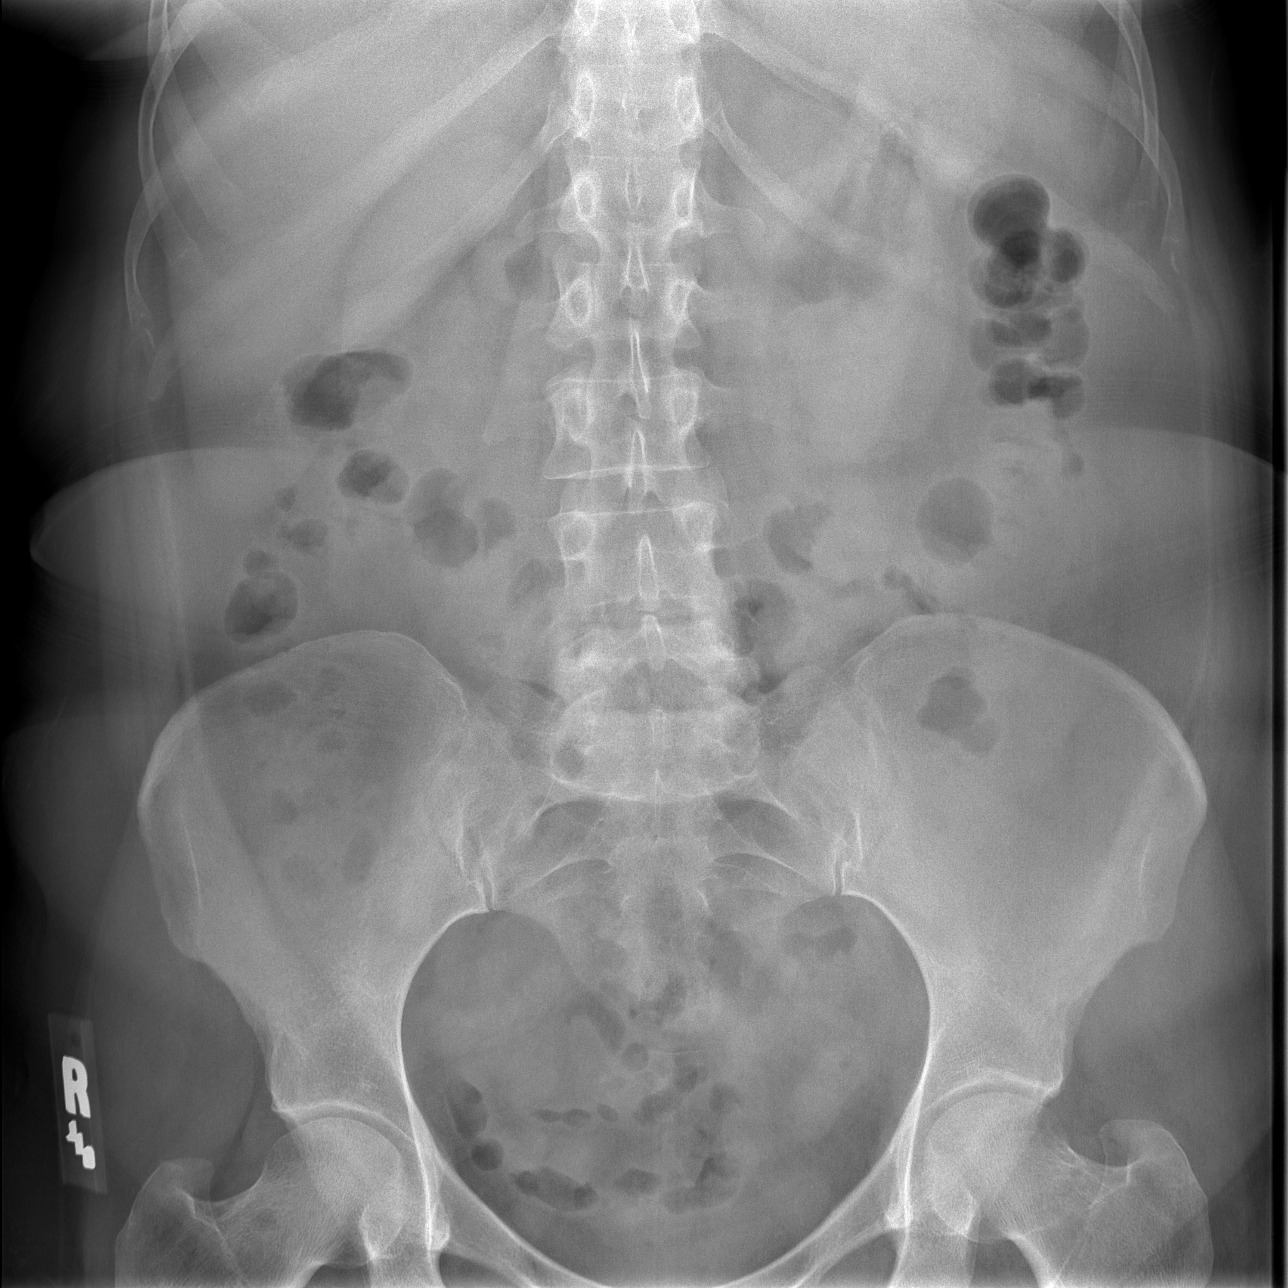

[3 of 3 positions shown; findings below may reference images not displayed]

FINDINGS: No active cardiopulmonary disease in one-view.

No free air or acute/specific abnormality of the bowel gas pattern.
Psoas margins intact.  No pathological calcifications or osseous
lesions.
IMPRESSION: No acute or significant findings.

## 2010-03-26 IMAGING — CR DG LUMBAR SPINE COMPLETE 4+V
5 series · 5 of 5 positions shown · non-contrast
Comparison: None.

CLINICAL DATA: Back pain radiating down left leg

LUMBAR SPINE - COMPLETE 4+ VIEW

[t l-spine a.p. *]
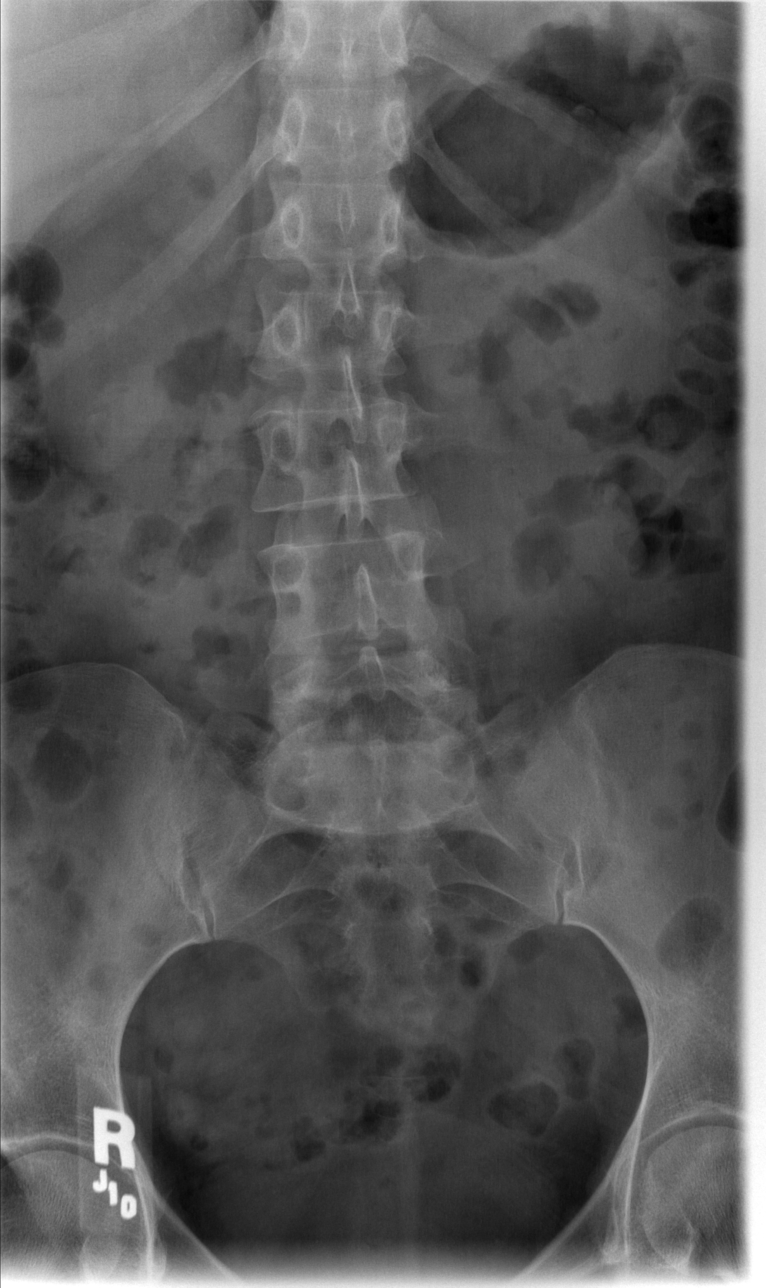

[t l-spine oblique exposure (1 of 2)]
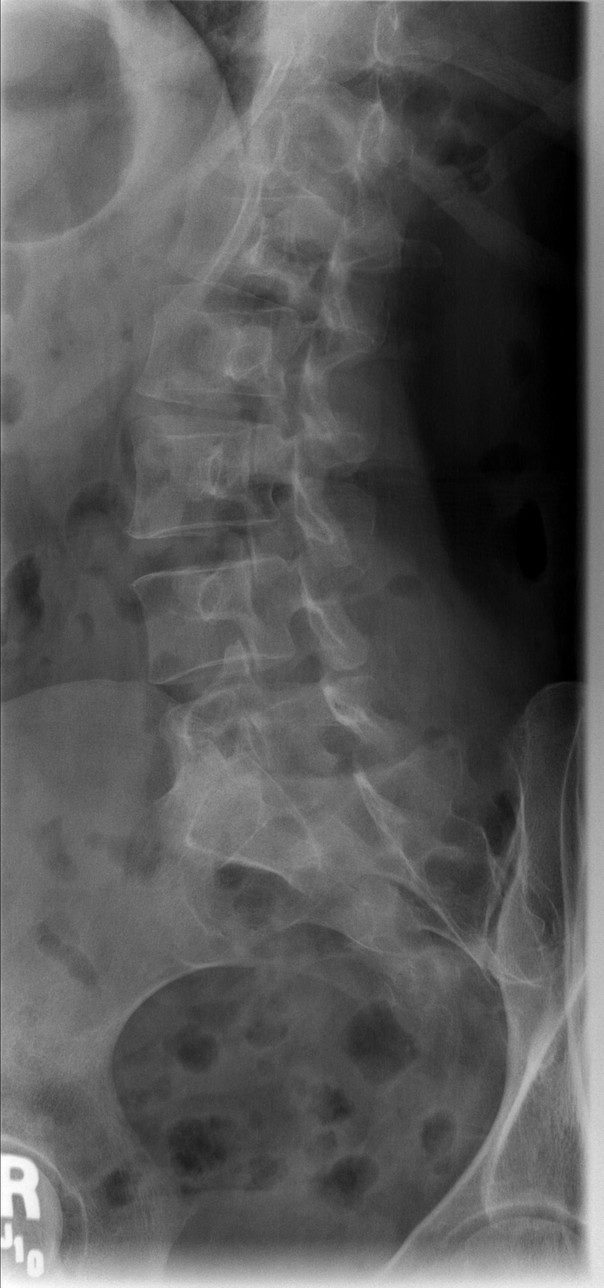

[t l-spine oblique exposure (2 of 2)]
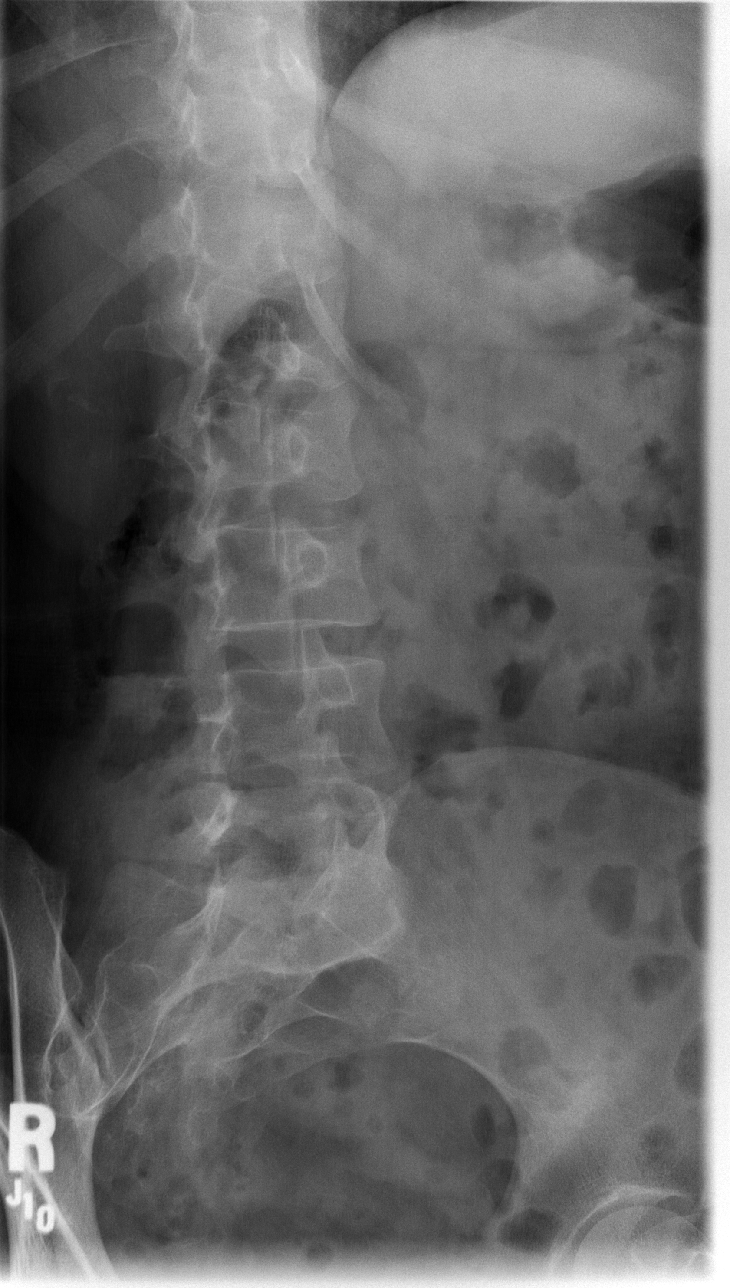

[t l-spine lat]
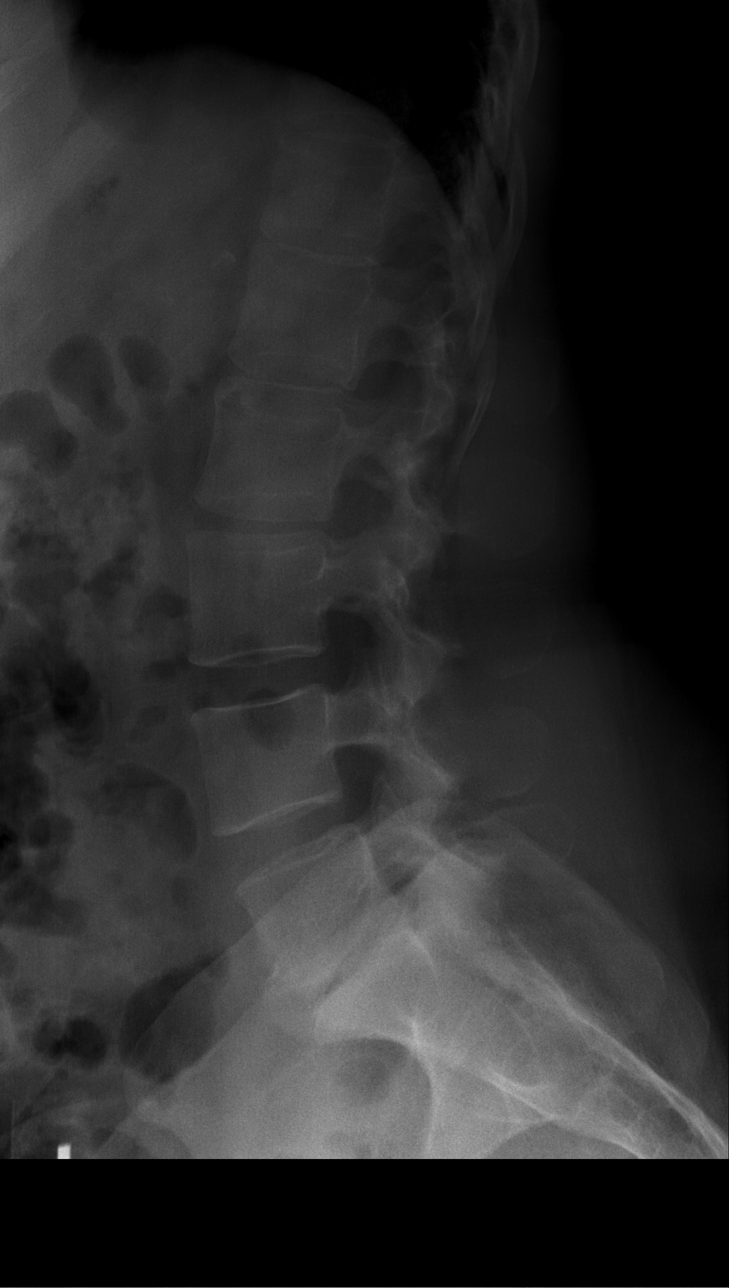

[t l-spine l5-s1 spot]
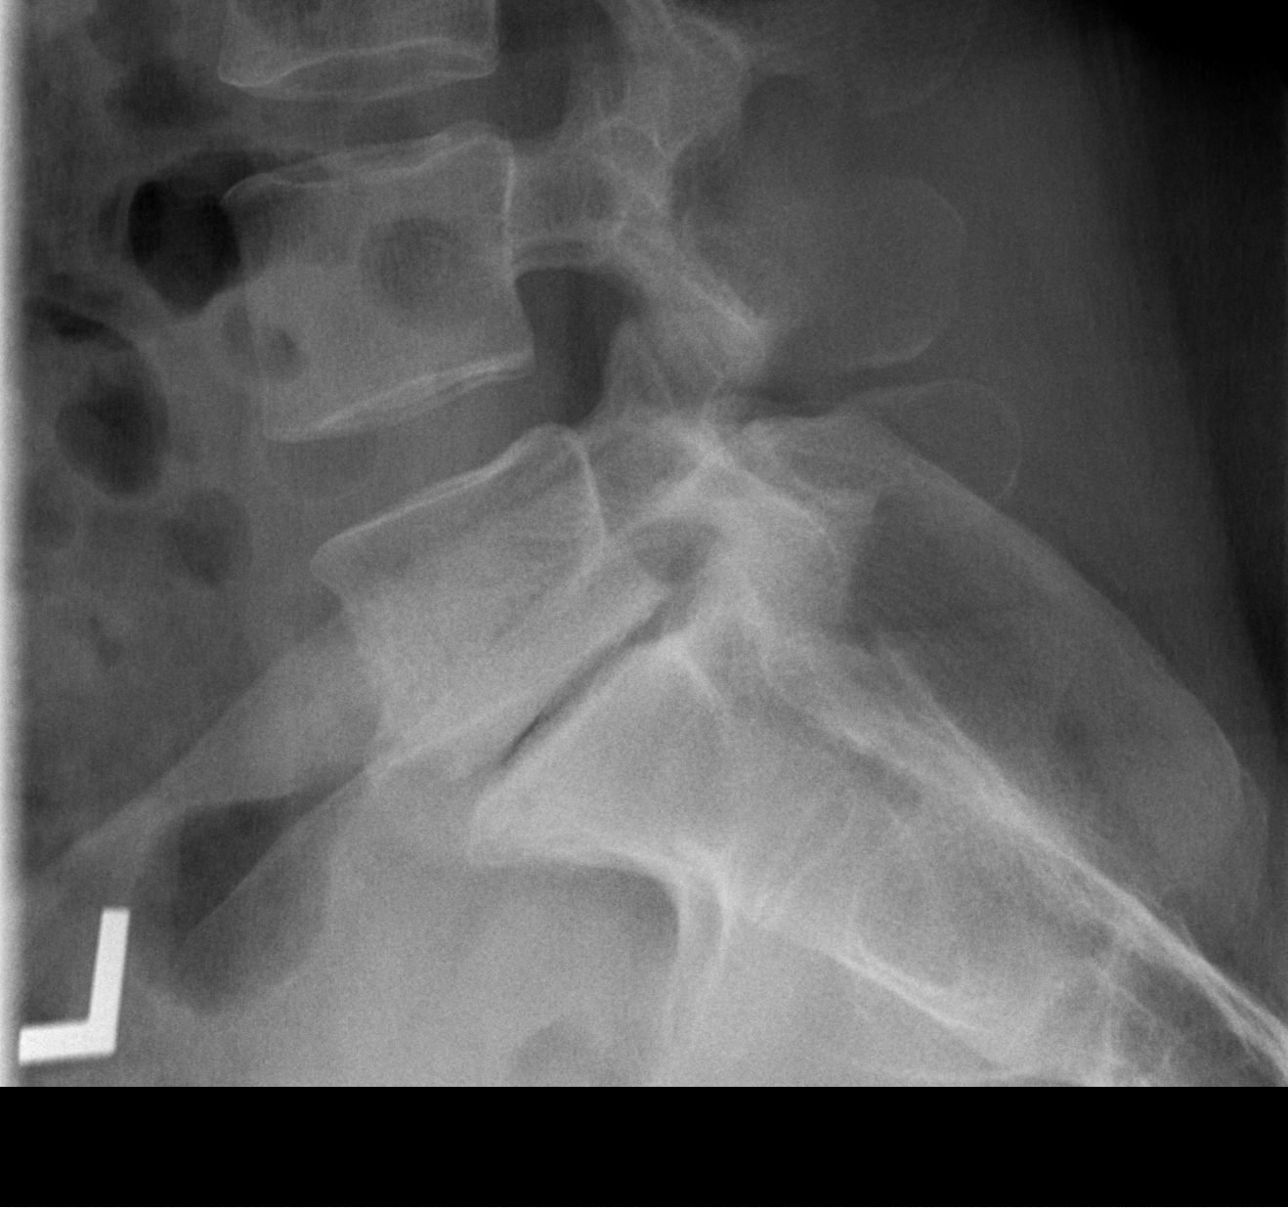

[5 of 5 positions shown; findings below may reference images not displayed]

FINDINGS: There are five non-rib bearing lumbar type vertebrae.
There is disc narrowing at L5-S1 with vacuum phenomenon.  Other
disc height preserved.  No  fracture or subluxation.  There is very
slight dextroscoliosis.  Soft tissues normal.
IMPRESSION: Degenerative disc disease at L5-S1.  No acute findings.

## 2010-05-07 ENCOUNTER — Inpatient Hospital Stay (INDEPENDENT_AMBULATORY_CARE_PROVIDER_SITE_OTHER)
Admission: RE | Admit: 2010-05-07 | Discharge: 2010-05-07 | Disposition: A | Discharge status: Home / Self Care | Payer: Self-pay | Source: Ambulatory Visit | Attending: Family Medicine | Admitting: Family Medicine

## 2010-05-07 DIAGNOSIS — J019 Acute sinusitis, unspecified: Secondary | ICD-10-CM

## 2010-05-31 LAB — POCT URINALYSIS DIP (DEVICE)
Bilirubin Urine: NEGATIVE
Glucose, UA: NEGATIVE mg/dL
Hgb urine dipstick: NEGATIVE
Ketones, ur: NEGATIVE mg/dL
Nitrite: NEGATIVE
Protein, ur: NEGATIVE mg/dL
Specific Gravity, Urine: <=1.005 (ref 1.005–1.030)
Urobilinogen, UA: 0.2 mg/dL (ref 0.0–1.0)
pH: 5.5 (ref 5.0–8.0)

## 2010-05-31 LAB — URINALYSIS, ROUTINE W REFLEX MICROSCOPIC
Bilirubin Urine: NEGATIVE
Bilirubin Urine: NEGATIVE
Glucose, UA: NEGATIVE mg/dL
Glucose, UA: NEGATIVE mg/dL
Hgb urine dipstick: NEGATIVE
Hgb urine dipstick: NEGATIVE
Ketones, ur: NEGATIVE mg/dL
Ketones, ur: NEGATIVE mg/dL
Nitrite: NEGATIVE
Protein, ur: NEGATIVE mg/dL
Protein, ur: NEGATIVE mg/dL
Specific Gravity, Urine: 1.012 (ref 1.005–1.030)
Urobilinogen, UA: 0.2 mg/dL (ref 0.0–1.0)
pH: 5 (ref 5.0–8.0)

## 2010-05-31 LAB — DIFFERENTIAL
Basophils Absolute: 0 10*3/uL (ref 0.0–0.1)
Basophils Relative: 1 % (ref 0–1)
Eosinophils Absolute: 1 10*3/uL — ABNORMAL HIGH (ref 0.0–0.7)
Eosinophils Relative: 12 % — ABNORMAL HIGH (ref 0–5)
Eosinophils Relative: 13 % — ABNORMAL HIGH (ref 0–5)
Lymphocytes Relative: 19 % (ref 12–46)
Lymphocytes Relative: 28 % (ref 12–46)
Lymphs Abs: 2.1 10*3/uL (ref 0.7–4.0)
Lymphs Abs: 2.3 10*3/uL (ref 0.7–4.0)
Monocytes Absolute: 0.3 10*3/uL (ref 0.1–1.0)
Monocytes Absolute: 0.4 10*3/uL (ref 0.1–1.0)
Monocytes Relative: 3 % (ref 3–12)
Monocytes Relative: 4 % (ref 3–12)
Neutro Abs: 4.6 10*3/uL (ref 1.7–7.7)
Neutrophils Relative %: 56 % (ref 43–77)

## 2010-05-31 LAB — CBC
HCT: 43.2 % (ref 36.0–46.0)
HCT: 43.6 % (ref 36.0–46.0)
Hemoglobin: 14.9 g/dL (ref 12.0–15.0)
Hemoglobin: 15 g/dL (ref 12.0–15.0)
MCHC: 34.6 g/dL (ref 30.0–36.0)
MCV: 86.7 fL (ref 78.0–100.0)
MCV: 86.8 fL (ref 78.0–100.0)
Platelets: 208 10*3/uL (ref 150–400)
RBC: 4.98 MIL/uL (ref 3.87–5.11)
RDW: 13.2 % (ref 11.5–15.5)
WBC: 11.2 10*3/uL — ABNORMAL HIGH (ref 4.0–10.5)
WBC: 8.2 10*3/uL (ref 4.0–10.5)

## 2010-05-31 LAB — COMPREHENSIVE METABOLIC PANEL
ALT: 18 U/L (ref 0–35)
ALT: 30 U/L (ref 0–35)
AST: 24 U/L (ref 0–37)
Albumin: 4 g/dL (ref 3.5–5.2)
Alkaline Phosphatase: 79 U/L (ref 39–117)
BUN: 9 mg/dL (ref 6–23)
CO2: 28 mEq/L (ref 19–32)
Calcium: 9.7 mg/dL (ref 8.4–10.5)
Calcium: 9.8 mg/dL (ref 8.4–10.5)
Chloride: 105 mEq/L (ref 96–112)
Creatinine, Ser: 0.78 mg/dL (ref 0.4–1.2)
GFR calc Af Amer: >60 mL/min (ref >60)
GFR calc non Af Amer: >60 mL/min (ref >60)
Glucose, Bld: 104 mg/dL — ABNORMAL HIGH (ref 70–99)
Glucose, Bld: 77 mg/dL (ref 70–99)
Potassium: 4.3 mEq/L (ref 3.5–5.1)
Sodium: 141 mEq/L (ref 135–145)
Sodium: 142 mEq/L (ref 135–145)
Total Bilirubin: 0.6 mg/dL (ref 0.3–1.2)
Total Protein: 6.6 g/dL (ref 6.0–8.3)
Total Protein: 6.6 g/dL (ref 6.0–8.3)

## 2010-05-31 LAB — URINE CULTURE: Culture: NO GROWTH

## 2010-05-31 LAB — LIPASE, BLOOD
Lipase: 20 U/L (ref 11–59)
Lipase: 46 U/L (ref 11–59)

## 2010-05-31 LAB — POCT I-STAT, CHEM 8
BUN: 9 mg/dL (ref 6–23)
Calcium, Ion: 1.23 mmol/L (ref 1.12–1.32)
Creatinine, Ser: 1 mg/dL (ref 0.4–1.2)
Glucose, Bld: 79 mg/dL (ref 70–99)
TCO2: 28 mmol/L (ref 0–100)

## 2010-05-31 LAB — POCT PREGNANCY, URINE: Preg Test, Ur: NEGATIVE

## 2010-05-31 LAB — AMYLASE: Amylase: 81 U/L (ref 27–131)

## 2010-05-31 LAB — POCT H PYLORI SCREEN: H. PYLORI SCREEN, POC: NEGATIVE

## 2010-07-03 NOTE — H&P (Signed)
NAMEITATI, BROCKSMITH NO.:  [id number]   MEDICAL RECORD NO.:  [id number]          PATIENT TYPE:  EMS   LOCATION:  ED                           FACILITY:  [facility]   PHYSICIAN:  Isidor Holts, M.D.  DATE OF BIRTH:  13-Oct-1960   DATE OF ADMISSION:  09/22/2006  DATE OF DISCHARGE:                              HISTORY & PHYSICAL   PRIMARY MEDICAL DOCTOR:  Unassigned.   CHIEF COMPLAINTS:  Recurrent chest pain for 2 months, worse over the  past 2-3 days.  Also epigastric pain for 3 weeks and diarrhea for the  last 2 days.   HISTORY OF PRESENT ILLNESS:  This is a 50 year old female.  For past  medical history, see below.  According to the patient, she has been  troubled by left-sided chest pain described as a dull ache, for the past  three months, usually exertional in character, sometimes associated with  left arm numbness.  Usual duration is about five minutes, sometimes  associated with shortness of breath and sweatiness but no nausea.  Over  the last two-three days, this has changed in character, has become a  tightness as well as pressure and has been constant.  As a matter of  fact, it was still present when she woke up in the a.m. of September 21, 2006.  Later on that afternoon, she came to the emergency department.  In addition, the patient complains of epigastric pain sharply localized  at the epigastrium, approximately three weeks in duration usually  precipitated by eating and subsides spontaneously, no radiation  whatsoever.  No associated belching or heartburn.  Over the past two  days she has had diarrhea with watery stool about three to four times a  day.  Denies vomiting.  Denies fever.  Has not eaten any unusual foods  and there is no clustering of cases.  Denies recent travel.   PAST MEDICAL HISTORY:  1. Depression.  2. Status post appendectomy at age 19 years.  3. Status post tonsillectomy at age 48 years.  4. Status post dilatation and  curettage/cone biopsy 1989 for severe      dysplasia.  5. Endometriosis status post laparoscopy/hysteroscopy January 2000.  6. Cystocele/rectocele.  7. Status post laparoscopic assisted vaginal hysterectomy with      bilateral salpingo-oophorectomy November 2002, and      anterior/posterior colporrhaphy.  8. Smoker.   MEDICATIONS:  Not on any regular medication.   ALLERGIES:  CODEINE.   REVIEW OF SYSTEMS:  As per HPI and chief complaint, otherwise negative.   SOCIAL HISTORY:  The patient has a fiance.  She also has two grown  offspring.  Smoker, has been smoking for approximately 10-12 years.  Drinks only socially.  Has no history of drug abuse.  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   FAMILY HISTORY:  The patient has one sister who is alive and well.  The  patient's mother is age 22 years.  She is hypertensive, but is otherwise  well.  Her father died at age 65 years.  He had CHF, diabetes mellitus,  had coronary artery disease status post CABG approximately 12 years ago.   PHYSICAL EXAMINATION:  VITALS:  Temperature 98.1, pulse 87 per minute,  respiratory 16, BP 131/88 mmHg, pulse oximeter 98% on room air.  GENERAL APPEARANCE:  The patient does not appear to be in obvious acute  distress, alert, communicative, not short of breath at rest.  HEENT:  No clinical pallor or jaundice.  No conjunctival injection.  Throat is clear.  NECK:  Supple.  JVD not seen.  No palpable lymphadenopathy.  No palpable  goiter.  No carotid bruits.  CHEST:  Clinically clear to auscultation.  No wheezes or crackles.  HEART:  Heart sounds 1 and 2 heard, normal, regular, no murmurs.  Patient has localized chest wall tenderness to palpation in left  anterior chest wall.  ABDOMEN:  Flat, soft and nontender.  No palpable organomegaly.  No  palpable masses.  Mild epigastric discomfort.  No guarding, no rebound.  Normal bowel sounds.   LOWER EXTREMITIES:  No pitting edema.  Palpable peripheral pulses.  MUSCULOSKELETAL:  Examination was quite unremarkable.  CENTRAL NERVOUS SYSTEM:  No focal neurologic deficit on gross  examination.   INVESTIGATIONS:  CBC with wbc 7.9, hemoglobin 15.2, hematocrit 44.3,  platelets 242.  Electrolytes with sodium 136, potassium 3.9, chloride  104, CO2 28, BUN 12, creatinine 0.98, glucose 102.  AST 23, ALT 20,  alkaline phosphatase 70, lipase 18.  Troponin I point of care less than  0.05.  Urinalysis is negative.   Chest x-ray dated September 22, 2006, showed no acute abnormalities.   EKG dated September 22, 2006, shows sinus rhythm, regular, 63 per minute,  normal axis, no acute ischemic changes.   ASSESSMENT AND PLAN:  1. Chest pain.  This sounds atypical in its description and even      appears to have a musculoskeletal component, as evidenced by the      fact that it is reproducible during chest wall pressure anteriorly.      We shall therefore commence the patient on analgesics.  However, as      she does have risk factors for coronary artery disease including      smoking history and family history, we shall cycle cardiac enzymes      and place on telemetric monitoring.  She will merit risk      stratification. Cardiology will be consulted in the a.m. of September 23, 2006.   1. Smoking history.  We shall counsel appropriately.  Utilize Nicoderm      CQ patch.   1. Epigastric pain.  Clinically this appears consistent with peptic      ulcer disease versus gastroesophageal reflux disease.  The patient      has no alarm symptoms, therefore a trial of proton pump inhibitor      treatment should be appropriate.   1. Diarrhea.  This is mild and duration is approximately two days.      The patient has not been on any recent antibiotics, has not      ingested any unusual foods.  There is no clustering of cases or      history of recent travel.  We shall send off stool studies,       otherwise manage conservatively for now.   Further management will depend on clinical course.      Isidor Holts, M.D.  Electronically Signed     CO/MEDQ  D:  09/23/2006  T:  09/23/2006  Job:  [id number]

## 2010-07-03 NOTE — Consult Note (Signed)
NAMETONANTZIN, MIMNAUGH NO.:  [id number]   MEDICAL RECORD NO.:  [id number]          PATIENT TYPE:  INP   LOCATION:  1441                         FACILITY:  [facility]   PHYSICIAN:  Peter M. [place]aziland, M.D.  DATE OF BIRTH:  1960/11/19   DATE OF CONSULTATION:  09/23/2006  DATE OF DISCHARGE:                                 CONSULTATION   CARDIOLOGY CONSULTATION   HISTORY OF PRESENT ILLNESS:  Ms. Jamie Mercado is a 50 year old white female  who is seen for evaluation of chest pain.  She describes a 26-month  history of left chest pain predominately a tightness with some left arm  numbness.  This discomfort is fairly constant with some periods of  accentuation.  This past week she also developed a different epigastric  pain that was worse after eating. She denies any nausea, vomiting,  diaphoresis.  She has had no shortness of breath.  Her chest pain is  definitely worse when she is under stress and is not associated with  activity.  She has had some recent diarrhea as well.   PAST MEDICAL HISTORY:  1. Status post appendectomy.  2. Status post tonsillectomy.  3. Status post D&C for dysplasia  4. Endometriosis  5. Status post vaginal hysterectomy and BSO  6. Status post cystocele and rectocele repair.   ALLERGIES:  She is allergic to CODEINE.   MEDICATIONS:  She has no prior medications.   FAMILY HISTORY:  Father died at age 6 with heart disease.  He had had  coronary bypass surgery in his 64s.  Mother has a history of  hypertension, otherwise healthy.  She has one sister who is alive and  well.   SOCIAL HISTORY:  The patient is a smoker, one pack per day since age 33.  She admits to occasional alcohol use.  She also admits to prior cocaine  and marijuana use.  She has been under a lot of stress recently since  her 85 year old son has moved back home with her.  She also has a 58-  year-old daughter.   PHYSICAL EXAMINATION:  On physical exam the patient is a white female  in  no distress.  Blood pressure is 91/57, pulse is 50,  she is afebrile.  HEENT exam is unremarkable.  She has no JVD, adenopathy, thyromegaly or  bruits.  LUNGS were clear.  CARDIAC:  Exam reveals irregular rate and rhythm without murmur, rub or  gallop.  She does have pain reproduced on palpation of anterior chest.  She also has epigastric pain to palpation.  ABDOMINAL EXAM is otherwise benign.  She has no edema, phlebitis.  Pulses were good.  NEUROLOGIC:  Exam is intact.   LABORATORY DATA:  White count 7900, hemoglobin 15.2, hematocrit 44.3,  platelets 242,000.  Sodium is 142, potassium 3.7, chloride 109, CO2 25,  BUN 11, creatinine 0.87, glucose is 105.  LFTs were normal.  Lipase is  normal.  Cardiac enzymes are negative x3.  TSH is 13.28.  Cholesterol  198, triglycerides 157, HDL 57, LDL of 107.  Urinalysis is normal.  A  urine drug screen is  positive for cocaine and THC.  ECG is normal x2.  Chest x-ray is no active disease.   IMPRESSION:  1. Atypical chest pain probably musculoskeletal versus psychosomatic.      I doubt specific cardiac cause  2. Tobacco abuse.  3. History of THC/cocaine use.  4. Epigastric pain.  5. Elevated TSH.   PLAN:  I agree with the use of proton pump inhibitor, would also give  her trial of analgesics.  Given her cardiac risk factors of tobacco use  and family history it would be reasonable to do a stress test but I  think this can be done as an outpatient and I will be happy to arrange  this.           ______________________________  Peter M. [place], M.D.     PMJ/MEDQ  D:  09/23/2006  T:  09/23/2006  Job:  [id number]

## 2010-07-06 NOTE — Op Note (Signed)
[facility]  Patient:    Jamie Mercado, PIXLER Visit Number: [id number] MRN: [id number]          Service Type: GYN Location: 4W [id number] 01 Attending Physician:  Michaele Offer Dictated by:   Zenaida Niece, M.D. Proc. Date: 12/25/00 Admit Date:  12/25/2000                             Operative Report  PREOPERATIVE DIAGNOSES: 1. Pelvic pain. 2. Irregular menstruation. 3. Cystocele and rectocele.  POSTOPERATIVE DIAGNOSES: 1. Pelvic pain. 2. Irregular menstruation. 3. Cystocele and rectocele.  PROCEDURES: 1. Laparoscopically assisted vaginal hysterectomy. 2. Bilateral salpingo-oophorectomy. 3. Anterior and posterior colporrhaphy. 4. Cystoscopy.  SURGEON:  Zenaida Niece, M.D.  ASSISTANT:  Malachi Pro. Ambrose Mantle, M.D.  ANESTHESIA:  General endotracheal anesthesia.  ESTIMATED BLOOD LOSS:  250 cc.  FINDINGS:  The left fallopian tube was adherent to the left pelvic sidewall, but, she had otherwise, normal pelvic anatomy except for Grade I cystocele and rectocele with more redundant vaginal tissue than actual prolapse.  PROCEDURE IN DETAIL:  The patient was taken to the operating room and placed in the dorsal supine position.  General anesthesia was induced and she was placed in mobile stirrups.  Abdomen, perineum and vagina were prepped and draped in the usual sterile fashion for laparoscopic assisted vaginal procedure.  Her bladder was drained with a Foley catheter.  A Hulka tenaculum was applied to her cervix for uterine manipulation.  Her infraumbilical skin was then infiltrated with 0.25% Marcaine and a 1.5 cm horizontal incision was made in her previous scar.  The 10-11 disposable trocar was introduced and the abdomen was insufflated with CO2 gas.  Placement was confirmed by the laparoscope.  5 mm ports were placed on each side under direct visualization. Inspection revealed the above mentioned findings.  The left fallopian tube  was dissected from the left pelvic sidewall bluntly and with use of the tripolar device.  Both infundibulopelvic ligaments, mesosalpinx, round ligaments and upper broad ligaments were divided with the tripolar device to mobilize the uterus.  The anterior peritoneum was incised and the bladder was pushed inferiorly.  This was done with good visualization of the uterine arteries and with adequate hemostasis.  Ureters were seen well below the incision lines.  Attention was then turned vaginally.  The legs were elevated in stirrups and the weighted speculum was inserted in the vagina.  The cervix was grasped with Christella Hartigan tenaculums and cervical vaginal mucosa was infiltrated with a dilute solution of Pitressin.  The cervical vaginal mucosa was then incised with electrocautery.  The bladder was pushed anteriorly and the posterior cul-de-sac was grasped and entered sharply.  The uterosacral ligaments were clamped, transected and ligated on each side with #1 chromic.  The bladder was pushed further anterior but I was not able to enter the peritoneal cavity at this time.  The uterine arteries were clamped, transected and ligated on each side with #1 chromic.  I was then able to identify the proper plane anteriorly and enter the peritoneum and place the Deaver retractor to retract the bladder anteriorly.  Broad ligament was then clamped, transected and ligated on each side and the uterus was removed sharply.  Bleeding throughout the posterior cuff was controlled by running the posterior cuff to the posterior peritoneum in a locking fashion with #1 chromic.  The anterior peritoneum was reapproximated to the vaginal cuff at 1 oclock and 11  oclock with figure-of-eight sutures of #1 chromic.  The remainder of the pedicles were hemostatic.  Attention was turned to the anterior repair.  The vaginal mucosa was grasped at the vaginal apex and dissected in the midline from the apex to approximately 2 cm  from the urethral meatus.  The cystocele was then mobilized sharply from the vaginal mucosa laterally with sharp and blunt dissection. Bleeding from both sides of the urethra at the most distal portion of this dissection was controlled with 3-0 Vicryl.  Cystocele was reduced with interrupted sutures of 2-0 Vicryl.  The vaginal mucosa was trimmed and closed in a running locking fashion to the vaginal cuff.  Vaginal cuff was then also closed with running locking 2-0 Vicryl after the uterosacral ligaments were plicated in the midline with one interrupted suture of 2-0 silk.  Attention was then turned to the rectocele.  A sliver of tissue at the hymenal ring was removed after grasping the hymenal ring with two Allis and this would close the introitus but allow easy passage of two fingers.  The vaginal mucosa was then dissected from the rectum in the midline from the hymenal ring to the vaginal apex.  Rectocele was reduced with three interrupted sutures of 2-0 Vicryl.  Excess vaginal mucosa was removed and the vagina was closed with running locking 2-0 Vicryl.  One bleeder was controlled with electrocautery as it was resistant to the sutures.  Indigo carmine was given IV and a Foley catheter was inserted at the bladder drain.  Water, 200 cc was instilled into the bladder and the Foley catheter was removed.  The 70 degree cystoscope was inserted and both ureters were identified and found to efflux freely.  There were no apparent sutures in the bladder or other injury to the bladder.  The cystoscope was then removed and the Foley catheter was reinserted.  Attention was turned back to the laparoscopy.  The abdomen was again insufflated with CO2 gas and inspection revealed some blood at the vaginal cuff.  Irrigation revealed no active bleeding.  All sites were hemostatic. The 5 mm ports were removed under direct visualization.  All gas was allowed to deflate from the abdomen and the 10/11  disposable trocar was then removed. Infraumbilical fascia was closed with figure-of-eight sutures of 0 Vicryl.  The skin incisions were closed with interrupted subcuticular sutures of 4-0 Vicryl followed by Steri-Strips and band-aids.  The vagina was then packed with two inch Iodoform gauze.  The patient was awakened in the operating room, tolerated the procedure well and was taken to the recovery room in stable condition. Dictated by:   Zenaida Niece, M.D. Attending Physician:  Michaele Offer DD:  12/25/00 TD:  12/27/00 Job: [id number]

## 2010-07-06 NOTE — Discharge Summary (Signed)
[facility]  Patient:    Jamie Mercado, Jamie Mercado Visit Number: [id number] MRN: [id number]          Service Type: GYN Location: 4W [id number] [id number] Attending Physician:  Michaele Offer Dictated by:   Zenaida Niece, M.D. Admit Date:  12/25/2000 Discharge Date: 12/28/2000                             Discharge Summary  ADMISSION DIAGNOSES: 1. Irregular menses. 2. Pelvic pain. 3. History of endometriosis. 4. Cystocele and rectocele.  DISCHARGE DIAGNOSES: 1. Irregular menses. 2. Pelvic pain. 3. History of endometriosis. 4. Cystocele and rectocele.  PROCEDURES:  Laparoscopic assisted vaginal hysterectomy with bilateral salpingo-oophorectomy, anterior and posterior colporrhaphy and cystoscopy.  COMPLICATIONS:  None.  CONSULTATIONS:  None.  HISTORY OF PRESENT ILLNESS:  Please see the chart for the full history and physical, but briefly, this is a 50 year old white female, para 2-0-1-2, with unpredictable menses every 2 to 5 weeks associated with fatigue, bloating, back pain, and dyspareunia.  She has had these symptoms for approximately six months, and they are getting worse.  The patient had a laparoscopic adhesiolysis and fulguration of endometriosis in January 2000, as well as a hysteroscopy with D&C.  She is admitted for definitive surgical therapy.  PAST MEDICAL HISTORY:  Depression.  PAST SURGICAL HISTORY: 1. Appendectomy. 2. Tonsillectomy. 3. Dilatation and curettage. 4. [facility] biopsy. 5. Tubal ligation. 6. Above mentioned laparoscopy and hysteroscopy.  OBSTETRICAL HISTORY: 1. Two vaginal deliveries at term without complications. 2. One spontaneous abortion.  MEDICATIONS: 1. Bextra p.r.n. 2. Paxil 30 mg q.d.  PHYSICAL EXAMINATION:  ABDOMEN:  Bilateral lower quadrant tenderness without masses, and no rebound or guarding.  She has well healed laparoscopic scars.  PELVIC:  No external lesions.  Cervix is normal.  Grade one  rectocele and grade two cystocele.  She has a small anteverted uterus with bilateral adnexal tenderness without masses.  HOSPITAL COURSE:  The patient was admitted on the day of surgery and taken to the operating room.  She had a LABH, BSO, A&P repair performed under general anesthesia with an estimated blood loss of 250 cc.  The left fallopian tube was found to be adherent to the pelvic side wall, and she had otherwise normal anatomy except for the cystocele, rectocele, and redundant vaginal tissue. Postoperatively, she did well, remained afebrile, and was rapidly able to ambulate and tolerate a regular diet.  Preoperative hemoglobin was 13.8, postoperatively was 11.9.  She had her Foley catheter removed on the morning of postoperative day #1, but was unable to void, so this was replaced.  She also declined discharge on postoperative day #2, due to the fact that she had not had a bowel movement or flatus.  On the morning of postoperative day #3, the patient was much more comfortable and did still have her Foley catheter in.  At this time, she was felt to be stable enough for discharge home.  CONDITION ON DISCHARGE:  Stable.  DISPOSITION:  Discharged to home.  DIET:  Regular.  ACTIVITY:  No strenuous activity, no driving, and pelvic rest.  DISCHARGE MEDICATIONS: 1. Percocet p.r.n. pain. 2. Macrobid one p.o. q.d. for prophylaxis with the Foley catheter in.  FOLLOWUP:  She is to follow up in three days for Foley catheter removal.  Final pathology reveals a cervix with focal changes suggestive of, but not diagnostic for human papilloma virus without dysplasia.  She had a benign  endometrium, normal myometrium, normal ovaries, with a hemorrhagic corpus luteum. Dictated by:   Zenaida Niece, M.D. Attending Physician:  Michaele Offer DD:  01/20/01 TD:  01/20/01 Job: [id number]

## 2010-07-06 NOTE — H&P (Signed)
[facility]  Patient:    Jamie Mercado, LUCKING Visit Number: [id number] MRN: [id number]          Service Type: GYN Location: 4W 0457 01 Attending Physician:  Michaele Offer Dictated by:   Zenaida Niece, M.D. Admit Date:  12/25/2000                           History and Physical  CHIEF COMPLAINT:  Pelvic pain.  HISTORY OF PRESENT ILLNESS:  This is a 50 year old white female, para 2-0-1-2, who I saw for an annual exam on November 27, 2000.  She is having menses every two to five weeks, which are unpredictable.  She feels very fatigued and bloated and complains of back pain and dyspareunia.  She has had these symptoms for approximately six months and they are getting worse.  The patient was seen in January of 2000, at which point she had a laparoscopic adhesiolysis and fulguration of endometriosis, as well as hysteroscopy with D&C, at which point endometriosis was found in the right ovarian fossa and adhesions in the left pelvis of the omentum and colon to the left pelvic sidewall.  She had a normal hysteroscopy.  Due to her symptoms of pelvic pain and a history of endometriosis and a prior laparoscopy for endometriosis, as well as her irregular bleeding, options were discussed with the patient, including repeat laparoscopy or hormonal cycle control.  Surgical options have also been discussed and the patient desires definitive surgical therapy with hysterectomy.  PAST MEDICAL HISTORY:  Significant for depression.  PAST SURGICAL HISTORY:  Appendectomy at age 24.  Tonsillectomy at age 21.  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  Tubal ligation in 1990.  The above-mentioned laparoscopy and hysteroscopy in January of 2000.  PAST OBSTETRICAL HISTORY:  Two vaginal deliveries at term without complications and one spontaneous abortion.  ALLERGIES:  CODEINE.  CURRENT MEDICATIONS:  Bextra p.r.n. and Paxil 30 mg a day.  PAST  GYNECOLOGICAL HISTORY:  Significant for the history of the dysplasia and cone biopsy.  The most recent Pap smear revealed a low-grade lesion and colposcopy was normal.  REVIEW OF SYSTEMS:  Some constipation and possibly occasional urine leak, but is otherwise negative.  SOCIAL HISTORY:  The patient is married and smokes a half of a pack of cigarettes a day.  FAMILY HISTORY:  No GYN or colon cancer.  PHYSICAL EXAMINATION:  Weight is 148 pounds.  VITAL SIGNS:  The blood pressure is 120/90 and the pulse is 70.  GENERAL APPEARANCE:  She is a well-developed, well-nourished, white female who is in no acute distress.  HEENT:  The pupils are equally round and reactive to light and accommodation. Extraocular muscles are intact.  The oropharynx is clear without erythema or exudates.  NECK:  Supple without lymphadenopathy or thyromegaly.  LUNGS:  Clear to auscultation.  HEART:  Regular rate and rhythm without murmur.  ABDOMEN:  Bilateral lower quadrant tenderness without masses and no rebound or guarding.  She has well-healed laparoscopic scars.  BREASTS:  Exam in the sitting and supine positions reveal no dominant masses, adenopathy, skin change, or nipple discharge.  EXTREMITIES:  No edema.  Nontender.  DTRs are 2/4 and symmetric.  PELVIC:  External genitalia has no lesions.  The cervix is normal.  The vagina has a grade 1 rectocele and a grade 2 cystocele.  Bimanual exam reveals a small anteverted uterus with bilateral adnexal tenderness without  masses.  ASSESSMENT:  Irregular menses with pelvic pain and a history of endometriosis. Again, options have been discussed with the patient and she desires definitive surgery with hysterectomy.  The patient also has mildly symptomatic cystocele and rectocele.  PLAN:  Admit the patient for laparoscopically-assisted vaginal hysterectomy with bilateral salpingo-oophorectomy and anterior and posterior repair. Dictated by:   Zenaida Niece, M.D. Attending Physician:  Michaele Offer DD:  12/25/00 TD:  12/26/00 Job: [id number]

## 2010-11-22 ENCOUNTER — Telehealth: Payer: Self-pay | Admitting: *Deleted

## 2010-11-22 NOTE — Telephone Encounter (Signed)
Pt called nurse voice mail yesterday and left a message that she would like to be seen for a breast lump. She stated that the area in question extends around to her rib cage and she is worried. She is requesting a mammogram. I was not able to reach her or leave a message yesterday but did speak to her this morning. I asked if she has any doctor that she sees in [place]. She said she has no doctor and ahe has no insurance. I discussed the Mammogram Scholarship program and told her that we can see her on 12/14/10 @ 1045. She can apply for the scholarship @ that time. I also told pt about the [facility] charity care program. Pt stated that she would like to have the appt. I told Ms Clydene Pugh that we ask for her to make a "good faith" payment of $20 on the Yamira Papa of her visit if possible. Pt voiced understanding.

## 2010-12-03 LAB — PREGNANCY, URINE: Preg Test, Ur: NEGATIVE

## 2010-12-03 LAB — POCT CARDIAC MARKERS
Myoglobin, poc: 59.5
Operator id: 1192

## 2010-12-03 LAB — URINALYSIS, ROUTINE W REFLEX MICROSCOPIC
Hgb urine dipstick: NEGATIVE
Nitrite: NEGATIVE
Specific Gravity, Urine: 1.022
Urobilinogen, UA: 0.2

## 2010-12-03 LAB — BASIC METABOLIC PANEL
GFR calc Af Amer: >60
GFR calc non Af Amer: >60
Glucose, Bld: 105 — ABNORMAL HIGH
Potassium: 3.7
Sodium: 142

## 2010-12-03 LAB — RAPID URINE DRUG SCREEN, HOSP PERFORMED
Barbiturates: NOT DETECTED
Opiates: NOT DETECTED

## 2010-12-03 LAB — CK TOTAL AND CKMB (NOT AT ARMC)
Relative Index: INVALID
Relative Index: INVALID

## 2010-12-03 LAB — DIFFERENTIAL
Basophils Relative: 1
Lymphs Abs: 2.7
Monocytes Relative: 5
Neutro Abs: 4.4
Neutrophils Relative %: 56

## 2010-12-03 LAB — LIPID PANEL
Cholesterol: 195
HDL: 57
LDL Cholesterol: 107 — ABNORMAL HIGH
Triglycerides: 157 — ABNORMAL HIGH

## 2010-12-03 LAB — COMPREHENSIVE METABOLIC PANEL
Alkaline Phosphatase: 70
BUN: 12
Calcium: 9.3
GFR calc non Af Amer: >60
Glucose, Bld: 102 — ABNORMAL HIGH
Total Protein: 6.9

## 2010-12-03 LAB — LIPASE, BLOOD: Lipase: 18

## 2010-12-03 LAB — CBC
HCT: 44.3
Hemoglobin: 15.2 — ABNORMAL HIGH
MCHC: 34.4
RDW: 13.7

## 2010-12-03 LAB — TROPONIN I
Troponin I: 0.04
Troponin I: 0.04

## 2010-12-08 ENCOUNTER — Ambulatory Visit (INDEPENDENT_AMBULATORY_CARE_PROVIDER_SITE_OTHER): Payer: Self-pay

## 2010-12-08 ENCOUNTER — Inpatient Hospital Stay (INDEPENDENT_AMBULATORY_CARE_PROVIDER_SITE_OTHER)
Admission: RE | Admit: 2010-12-08 | Discharge: 2010-12-08 | Disposition: A | Discharge status: Home / Self Care | Payer: Self-pay | Source: Ambulatory Visit | Attending: Emergency Medicine | Admitting: Emergency Medicine

## 2010-12-08 DIAGNOSIS — J189 Pneumonia, unspecified organism: Secondary | ICD-10-CM

## 2010-12-08 IMAGING — CR DG CHEST 2V
2 series · 2 of 2 positions shown · non-contrast
Comparison: PA and lateral chest [DATE].

CLINICAL DATA: Cough.  Wheezing.

CHEST - 2 VIEW

[view not recorded (1 of 2)]
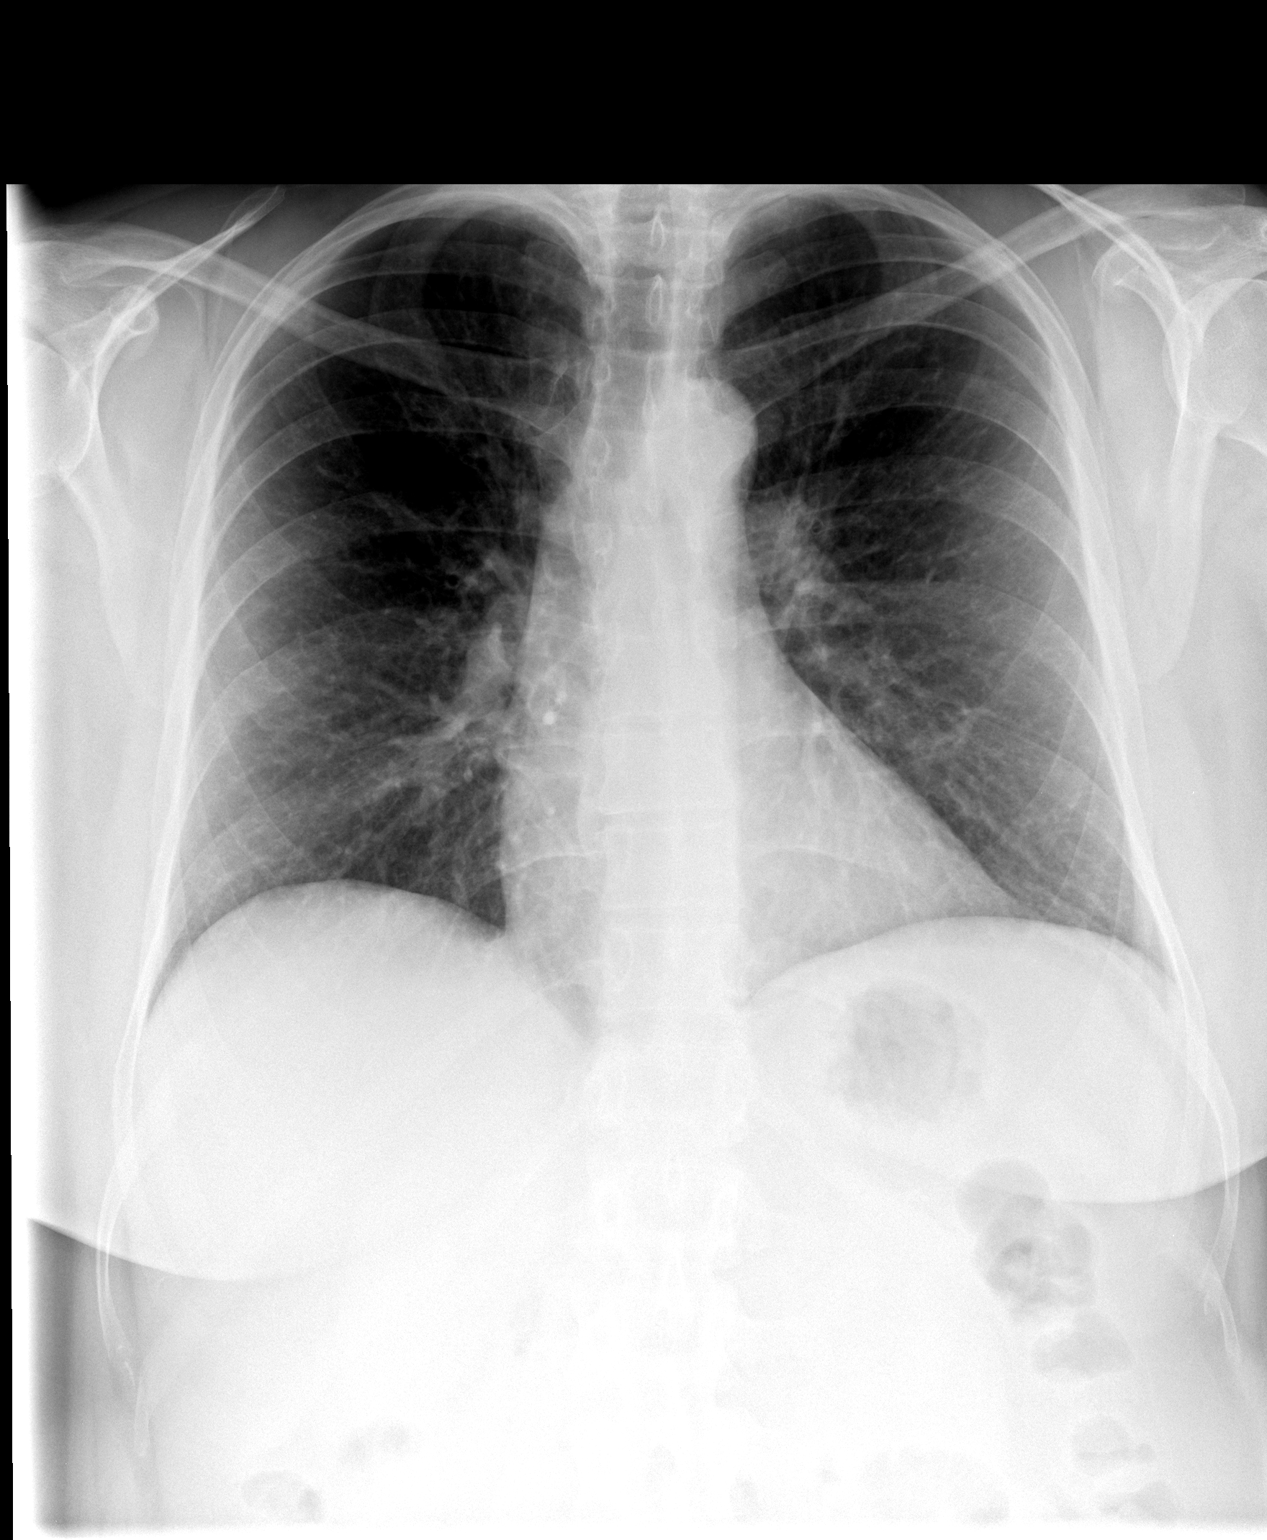

[view not recorded (2 of 2)]
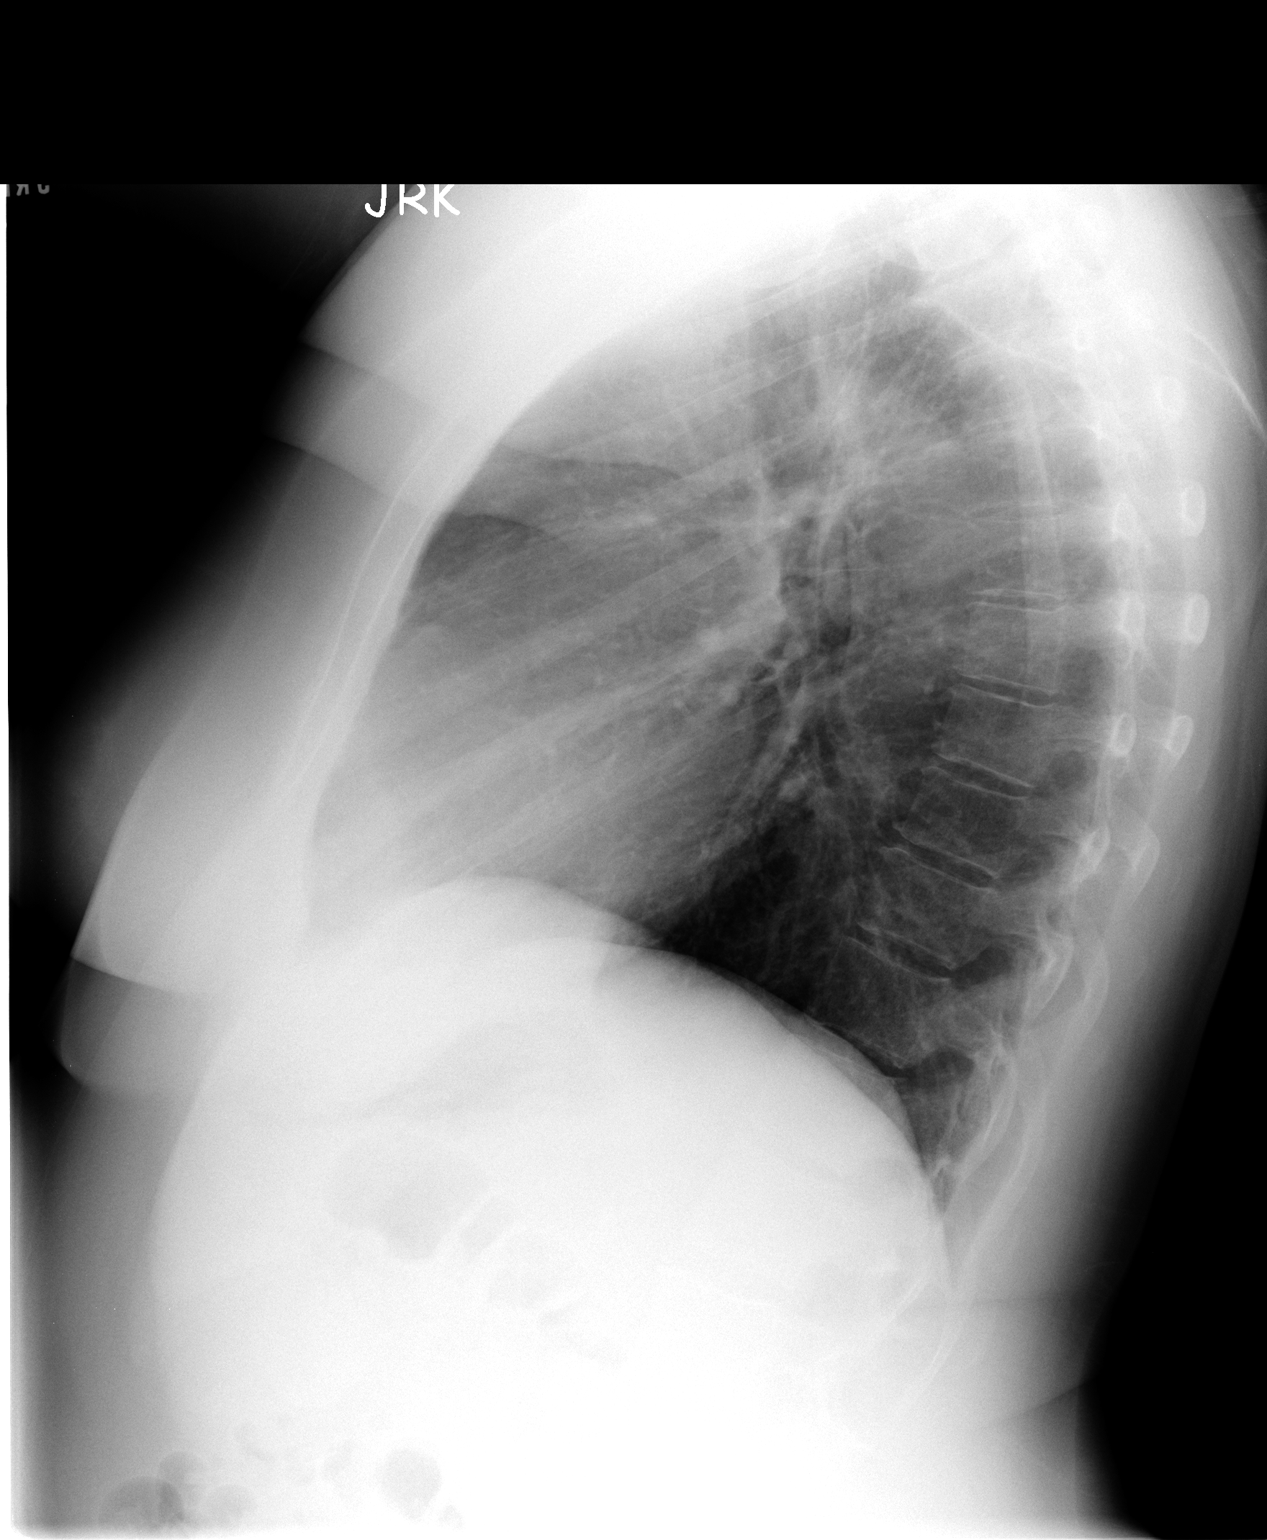

[2 of 2 positions shown; findings below may reference images not displayed]

FINDINGS: Lungs are clear.  Heart size is normal.  No pneumothorax
or pleural effusion.
IMPRESSION: Negative chest.

## 2010-12-10 ENCOUNTER — Inpatient Hospital Stay (INDEPENDENT_AMBULATORY_CARE_PROVIDER_SITE_OTHER)
Admission: RE | Admit: 2010-12-10 | Discharge: 2010-12-10 | Disposition: A | Discharge status: Home / Self Care | Payer: Self-pay | Source: Ambulatory Visit | Attending: Emergency Medicine | Admitting: Emergency Medicine

## 2010-12-10 DIAGNOSIS — J189 Pneumonia, unspecified organism: Secondary | ICD-10-CM

## 2010-12-14 ENCOUNTER — Encounter: Payer: Self-pay | Admitting: Physician Assistant

## 2010-12-14 ENCOUNTER — Ambulatory Visit (INDEPENDENT_AMBULATORY_CARE_PROVIDER_SITE_OTHER): Payer: Self-pay | Admitting: Physician Assistant

## 2010-12-14 VITALS — BP 128/89 | HR 78 | Temp 97.0°F | Ht 66.0 in | Wt 183.1 lb

## 2010-12-14 DIAGNOSIS — N63 Unspecified lump in unspecified breast: Secondary | ICD-10-CM

## 2010-12-14 NOTE — Progress Notes (Signed)
Chief Complaint:  lump in breast   Jamie Mercado is  50 y.o. [id number].  No LMP recorded. Patient has had a hysterectomy.  She presents complaining of lump in breast  Onset is described as gradual and has been present for  4 years. She had a diagnostic mammogram in 2008 and Dx with L breast lipoma.  Pt has had pain and tenderness in the L breast x6 mos and intermittent lymph node swelling in the L axilla.  Increased general tenderness on the L chest wall and breast x 3 weeks.  She also states palpation of a lump in the LOQ of the L breast.  No lesions, discharge, bleeding, or swelling.  Ssx not present in the R breast.   Dx with CAP by [facility] on 12/08/10 after 3 weeks of URI ssx- Tx with Doxycycline, prednisone PO, Tessalon Perles, albuterol inhaler  Obstetrical/Gynecological History: Cervical cancer 2004 Total hysterectomy 2004  Pertinent Gynecological History: Menses: s/p hysterectomy DES exposure: denies Previous GYN Procedures: s/p hysterectomy  Last mammogram: abnormal: L breast lipoma Date: 2008 Last pap: none since Date: 2004   Past Medical History: Past Medical History  Diagnosis Date  . Asthma     Past Surgical History: Past Surgical History  Procedure Date  . Abdominal hysterectomy   . Appendectomy   . Tonsillectomy   . Endometrosis     Family History: Family History  Problem Relation Age of Onset  . Diabetes Father   . Heart disease Father   . Hypertension Father   . Cancer Paternal Grandmother     breast cancer    Social History: History  Substance Use Topics  . Smoking status: Former Smoker    Quit date: 12/09/2010  . Smokeless tobacco: Never Used  . Alcohol Use: Yes    Allergies:  Allergies  Allergen Reactions  . Codeine      (Not in a hospital admission)  Review of Systems - General ROS: positive for  - fatigue, hot flashes, sleep disturbance and weight gain Psychological ROS: positive for - anxiety, concentration difficulties  and depression Hematological and Lymphatic ROS: positive for - swollen lymph nodes Breast ROS: positive for - new or changing breast lumps Respiratory ROS: positive for - cough, pleuritic pain, shortness of breath and wheezing Cardiovascular ROS: no chest pain or dyspnea on exertion Gastrointestinal ROS: no abdominal pain, change in bowel habits, or black or bloody stools  Physical Exam   Blood pressure 128/89, pulse 78, temperature 97 F (36.1 C), temperature source Oral, height 5\' 6"  (1.676 m), weight 183 lb 1.6 oz (83.054 kg).  General: General appearance - alert, well appearing, and in no distress Mental status - alert, oriented to person, place, and time Lymphatics - no palpable lymphadenopathy, no cervical or axillary lymphadenopathy Chest - wheezing noted bilateral upper fields; rhonchi in bases bilaterally Heart - normal rate, regular rhythm, normal S1, S2, no murmurs, rubs, clicks or gallops Breasts - right breast normal without mass, skin or nipple changes or axillary nodes, L breast with global tenderness to palpation with possible lipoma palpated in the LOQ Neurological - alert, oriented, normal speech, no focal findings or movement disorder noted Extremities - peripheral pulses normal, no pedal edema, no clubbing or cyanosis Focused Gynecological Exam: examination not indicated  Labs: No results found for this or any previous visit (from the past 24 hour(s)). Imaging Studies:  Dg Chest 2 View  12/08/2010  *RADIOLOGY REPORT*  Clinical Data: Cough.  Wheezing.  CHEST -  2 VIEW  Comparison: PA and lateral chest 09/22/2006.  Findings: Lungs are clear.  Heart size is normal.  No pneumothorax or pleural effusion.  IMPRESSION: Negative chest.  Original Report Authenticated By: Bernadene Bell. Maricela Curet, M.D.     Assessment: 1. Breast tenderness 2. Resolving URI 3. Probable anxiety/depression 4. Hx of cervical cancer, s/p hysterectomy  Plan: 1. Mammogram scheduled 2. Continue meds  as Rx by [facility] 3. Referral to [facility] for PCP 4. F/U after mammogram for results and pap-smear  [facility] Cadon Raczka 12/14/2010,11:18 AM

## 2010-12-25 ENCOUNTER — Telehealth: Payer: Self-pay | Admitting: *Deleted

## 2010-12-25 NOTE — Progress Notes (Signed)
I have seen/examined this patient and agree with the student's assessment and plan.

## 2010-12-25 NOTE — Telephone Encounter (Signed)
Pt left message stating that she is still waiting to hear about her mammo appt and clinic appt for Pap smear.  I left message for pt that the application process for the mammogram scholarship takes 4-6 wks. If she has not been contacted by the end of this month, she may call the Radiology dept to inquire about her status. Once she has received an appt for the mammo, she should call and schedule her clinic appt for follow up and Pap - it does not have to be on the same Jamie Mercado.

## 2011-01-07 ENCOUNTER — Other Ambulatory Visit: Payer: Self-pay | Admitting: Family

## 2011-01-07 DIAGNOSIS — N6310 Unspecified lump in the right breast, unspecified quadrant: Secondary | ICD-10-CM

## 2011-01-28 ENCOUNTER — Ambulatory Visit
Admission: RE | Admit: 2011-01-28 | Discharge: 2011-01-28 | Disposition: A | Discharge status: Home / Self Care | Payer: No Typology Code available for payment source | Source: Ambulatory Visit | Attending: Family | Admitting: Family

## 2011-01-28 ENCOUNTER — Other Ambulatory Visit: Payer: Self-pay | Admitting: Family

## 2011-01-28 DIAGNOSIS — N6320 Unspecified lump in the left breast, unspecified quadrant: Secondary | ICD-10-CM

## 2011-01-28 DIAGNOSIS — N6310 Unspecified lump in the right breast, unspecified quadrant: Secondary | ICD-10-CM

## 2011-01-28 IMAGING — US US BREAST L
1 series · 10 of 10 positions shown · non-contrast
Comparison: There are currently no prior studies available for
comparison.

***ADDENDUM*** CREATED: [DATE] [DATE]

Previous mammograms from [REDACTED] dated [DATE] are now
available for comparison.  When compared with the prior study there
has been no significant change.  There are no findings worrisome
for developing malignancy.
CLINICAL DATA: The patient notes lumpiness/thickening within the
lower left axillary region and lateral left breast.  There is also
tenderness in these regions.
DIGITAL DIAGNOSTIC BILATERAL MAMMOGRAM WITH CAD AND LEFT BREAST
ULTRASOUND:

[Series 1: us breast left · 10 of 10 slices shown]
[im 1/10]
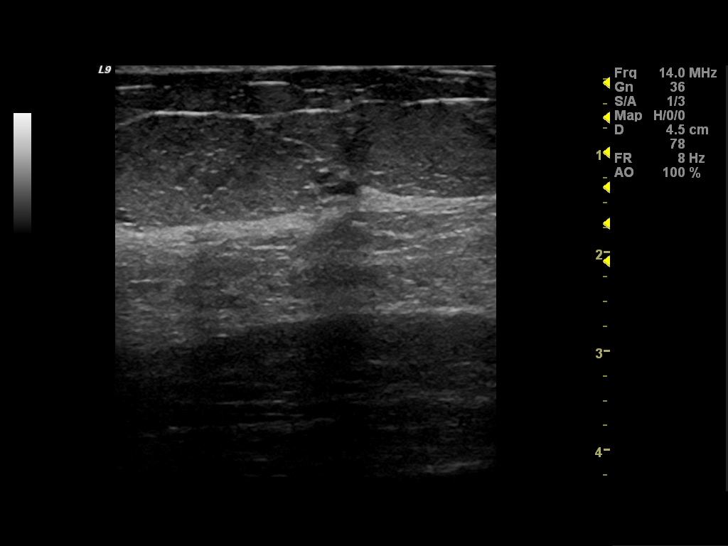
[im 2/10]
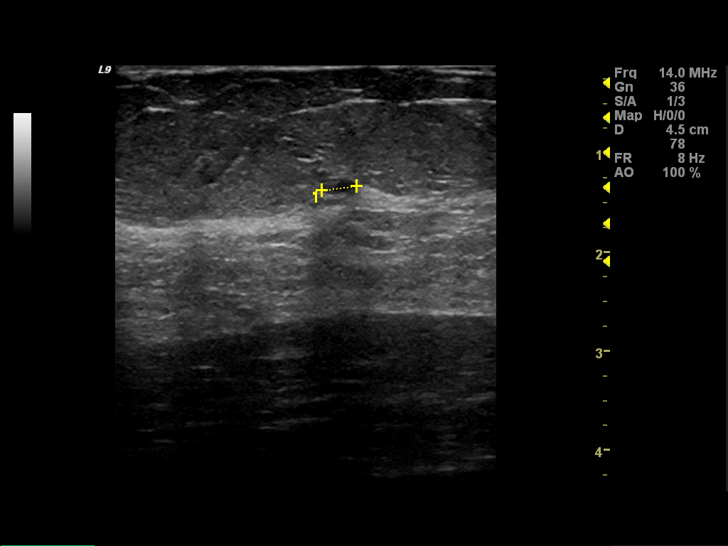
[im 3/10]
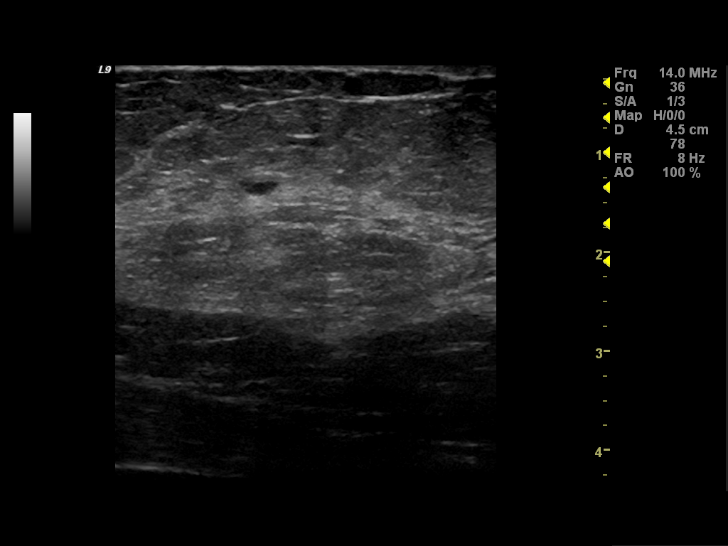
[im 4/10]
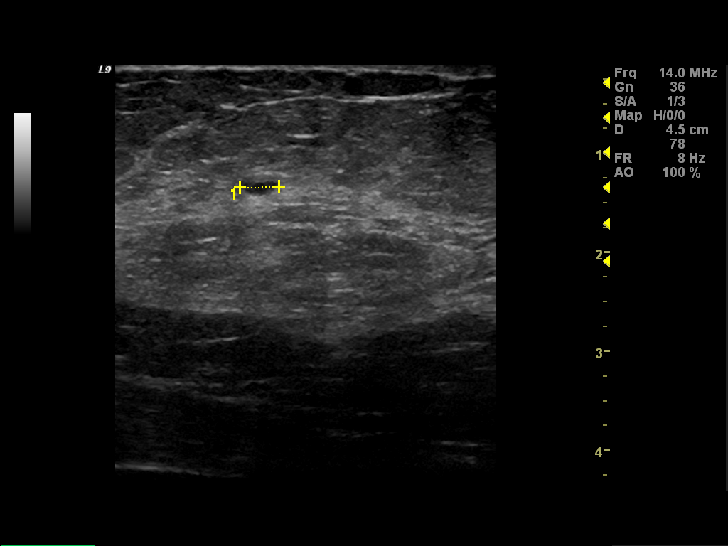
[im 5/10]
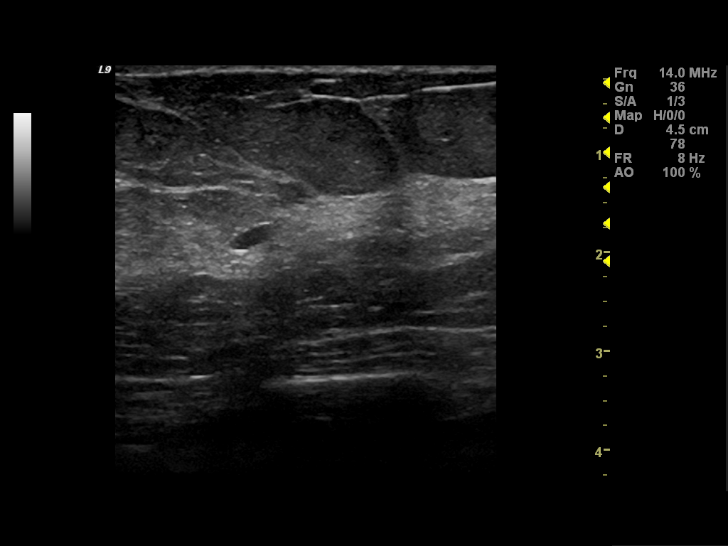
[im 6/10]
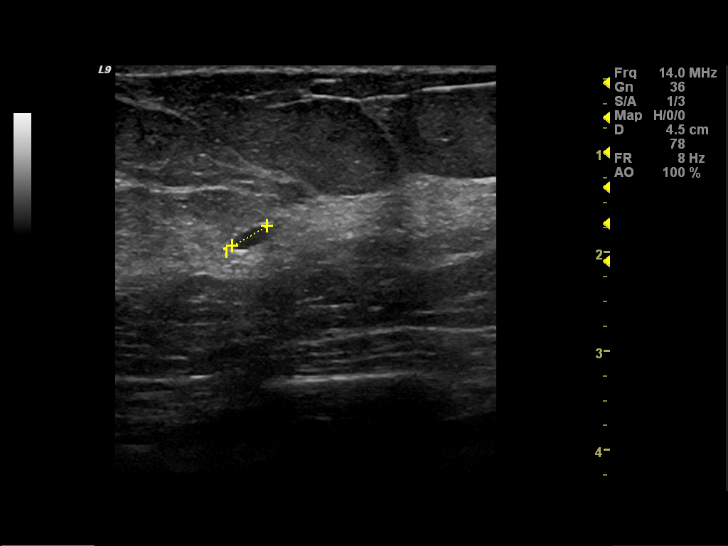
[im 7/10]
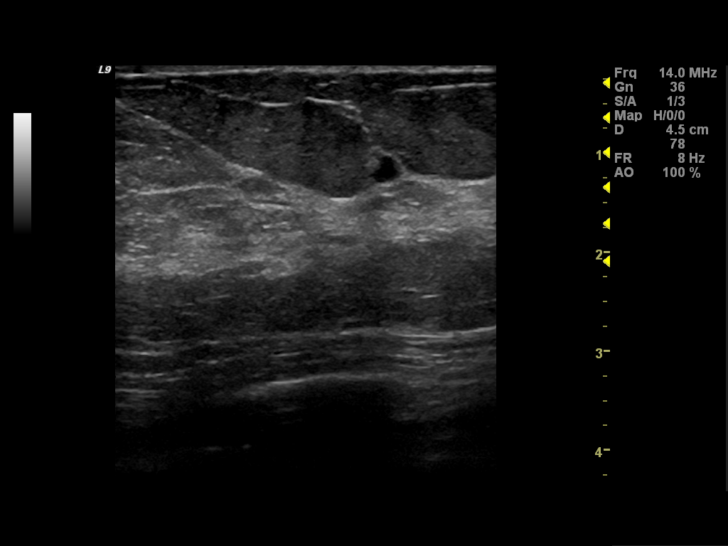
[im 8/10]
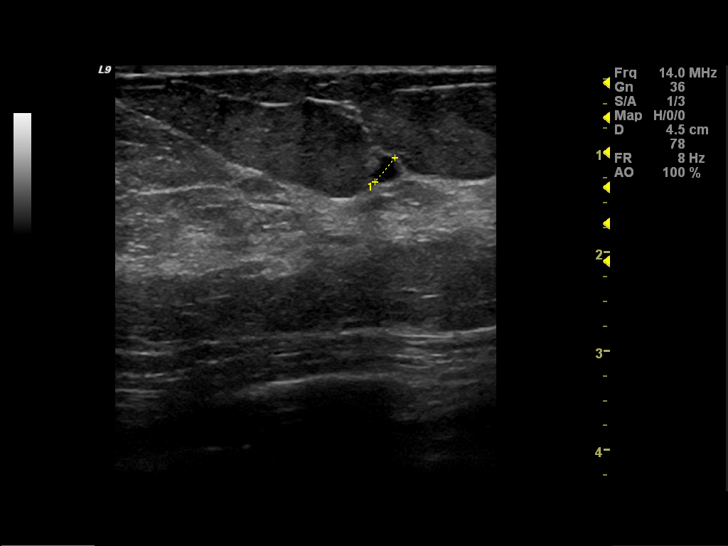
[im 9/10]
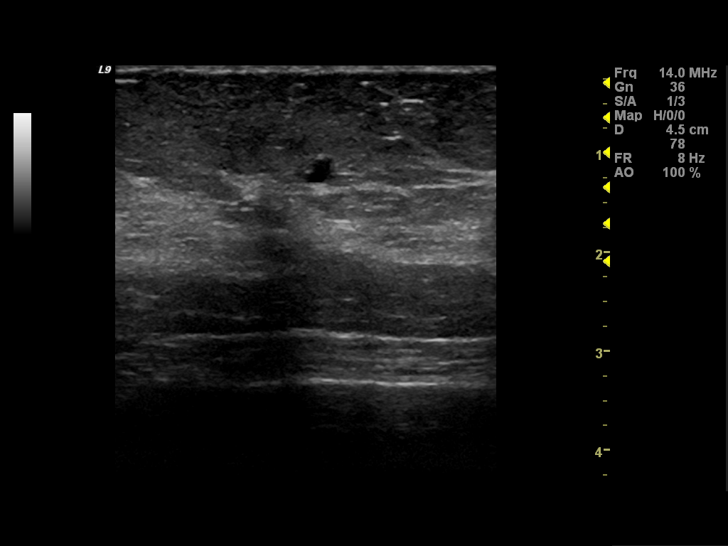
[im 10/10]
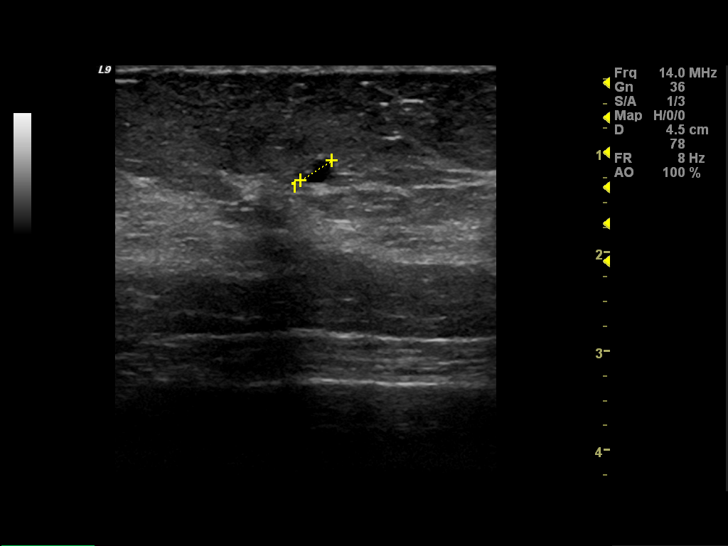

[10 of 10 positions shown; findings below may reference images not displayed]

IMPRESSION: No findings worrisome for developing malignancy.
Recommend screening mammography in 1 year.

BI-RADS CATEGORY 2:  Benign finding(s).

Addended by:  MUHAMMED MUSA, M.D. on [DATE] [DATE].

***END ADDENDUM*** SIGNED BY: MUHAMMED MUSA, M.D.
The patient has previously undergone mammography at
[REDACTED].  Attempts will be made to obtain these prior
studies.
FINDINGS: There is a scattered fibroglandular pattern.  There is a
small partially obscured nodule located laterally within the left
breast on the CC view (not well visualized on the MLO view).  There
is no distortion or worrisome calcification within either breast.
Mammographic images were processed with CAD.

On physical exam, there is no discrete palpable abnormality within
the lateral left breast or upper-outer quadrant of the left breast.

Ultrasound is performed, showing normal-appearing fibroglandular
tissue within the lateral left breast and upper-outer quadrant of
the left breast.  There are several tiny benign appearing cysts
located within the left breast.  There are two adjacent cysts
located at the 4 o'clock position 5 cm from nipple each measuring 4
mm in size.  In addition, there is a 4 mm simple cyst located
within the left breast at the 2 o'clock position 3 cm from the
nipple.  There is no mass, distortion, or worrisome shadowing.
IMPRESSION: Small simple cysts located within the left breast at the two and
four o'clock positions as discussed above.  No findings worrisome
for developing malignancy.  Attempts will be made to obtain prior
mammograms from [REDACTED] for comparison purposes.  When these
become available, the report will be addended.  Recommend screening
mammography in 1 year.

BI-RADS CATEGORY 0:  Incomplete.  Need additional imaging
evaluation and/or prior mammograms for comparison.

## 2011-01-29 IMAGING — MG MM DIGITAL DIAGNOSTIC BILAT
7 series · 7 of 7 positions shown · non-contrast
Comparison: There are currently no prior studies available for
comparison.

***ADDENDUM*** CREATED: [DATE] [DATE]

Previous mammograms from [REDACTED] dated [DATE] are now
available for comparison.  When compared with the prior study there
has been no significant change.  There are no findings worrisome
for developing malignancy.
CLINICAL DATA: The patient notes lumpiness/thickening within the
lower left axillary region and lateral left breast.  There is also
tenderness in these regions.
DIGITAL DIAGNOSTIC BILATERAL MAMMOGRAM WITH CAD AND LEFT BREAST
ULTRASOUND:

[R CC]
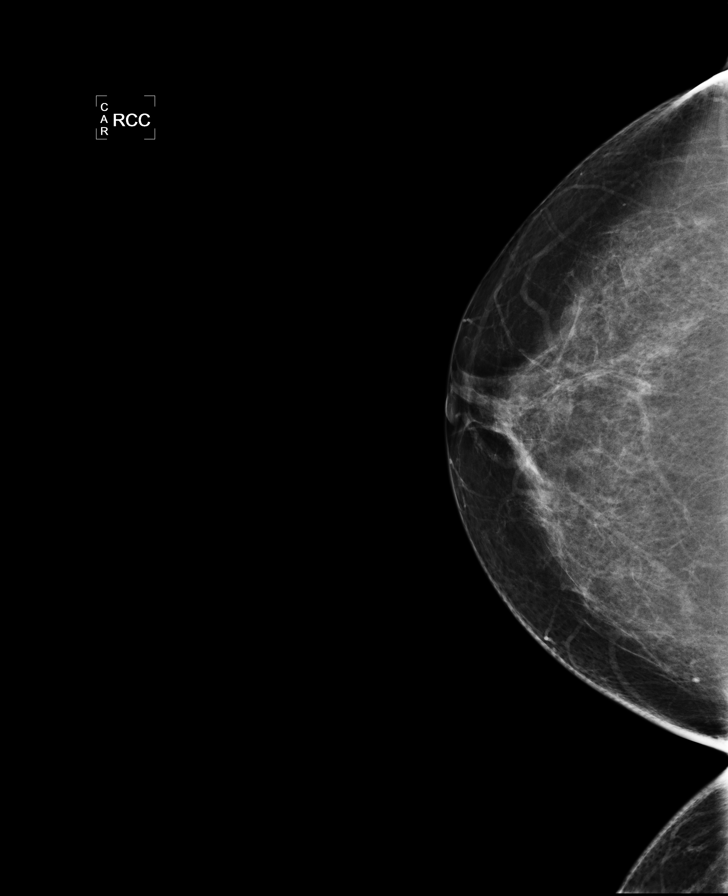

[L CC (1 of 3)]
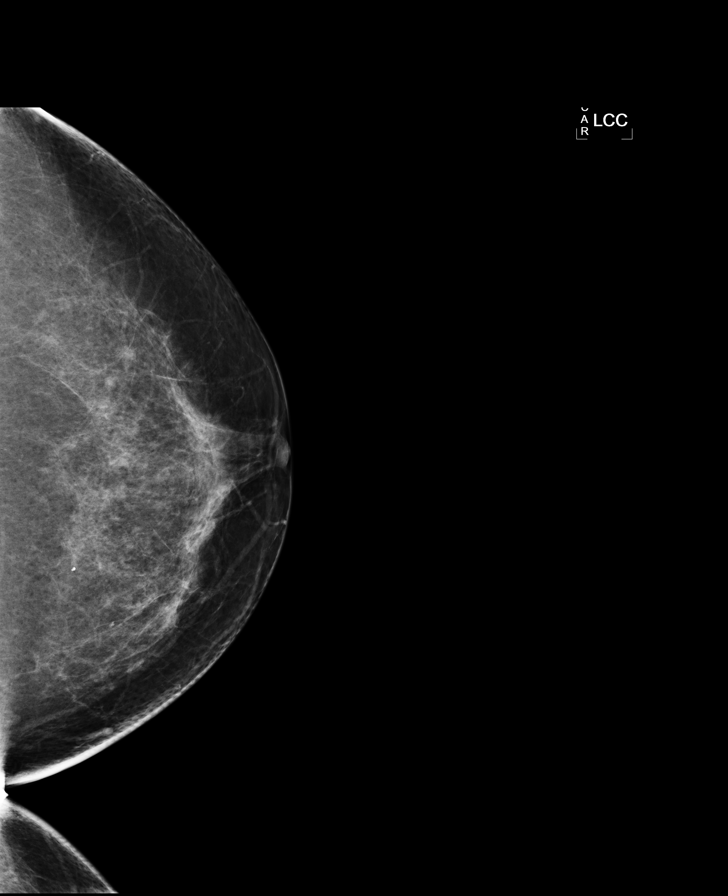

[L MLO (1 of 2)]
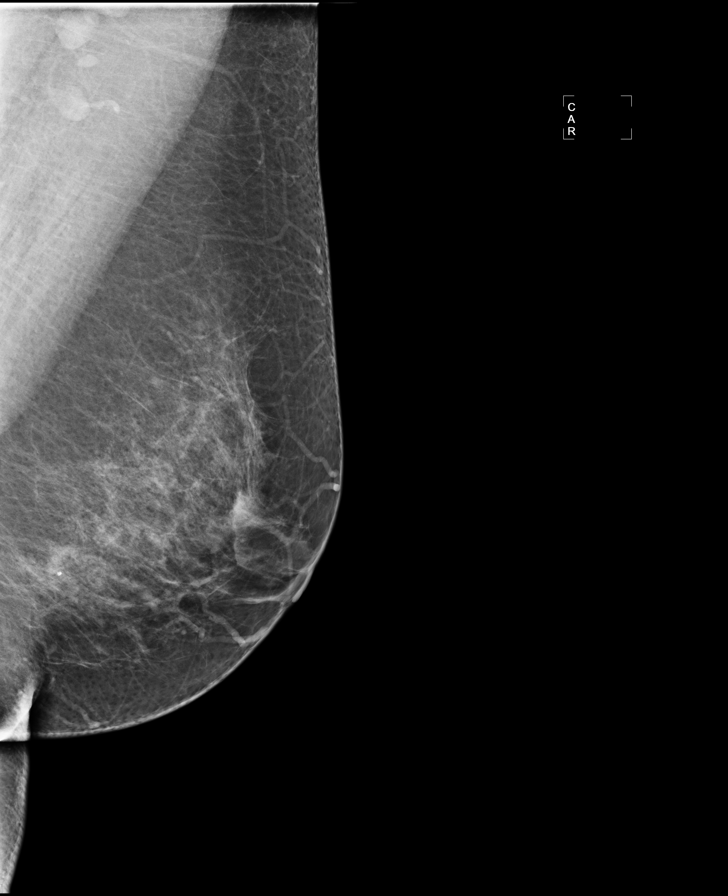

[R MLO]
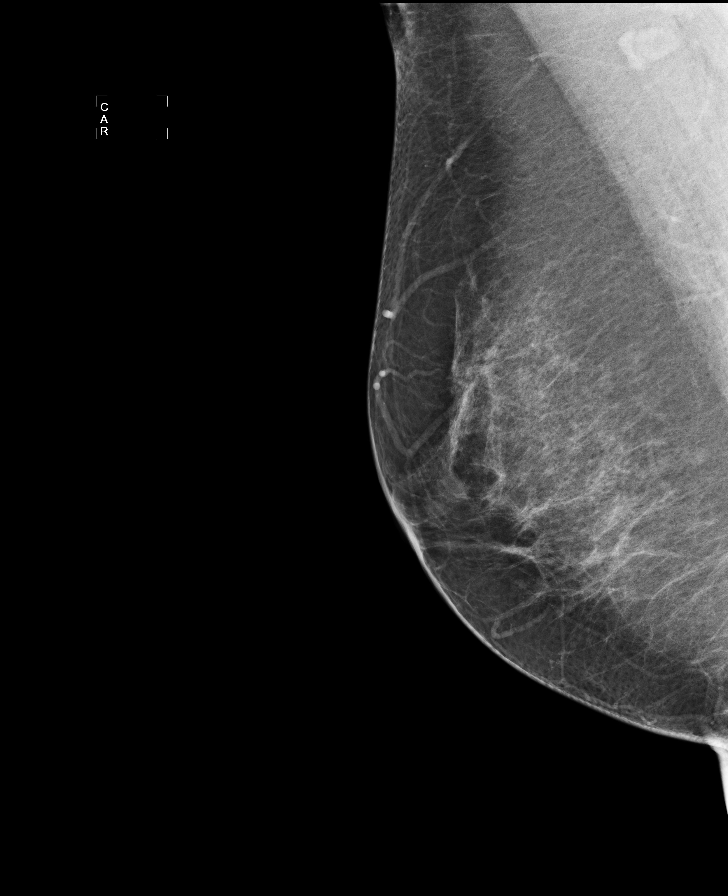

[L CC (2 of 3)]
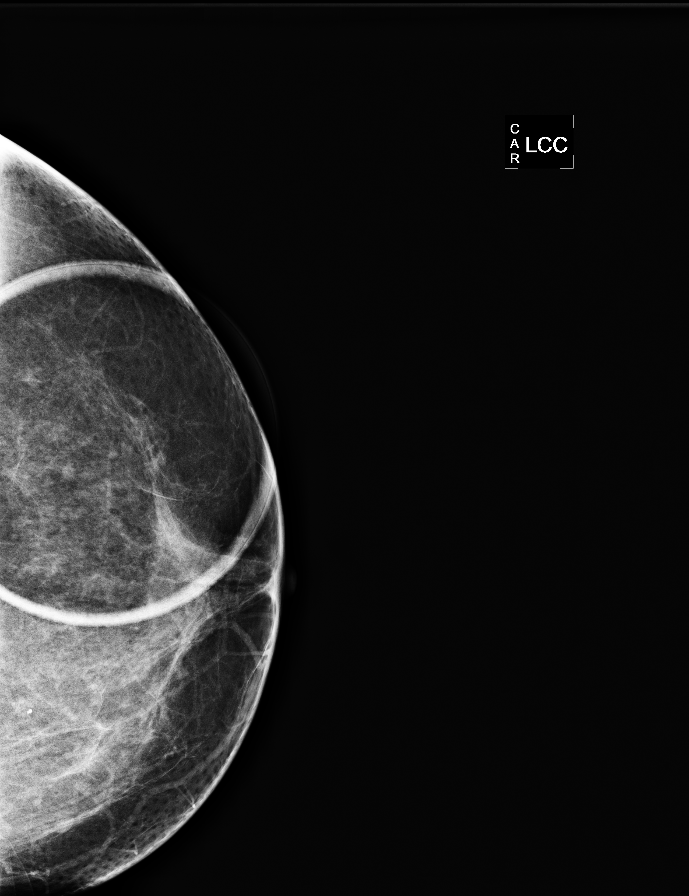

[L MLO (2 of 2)]
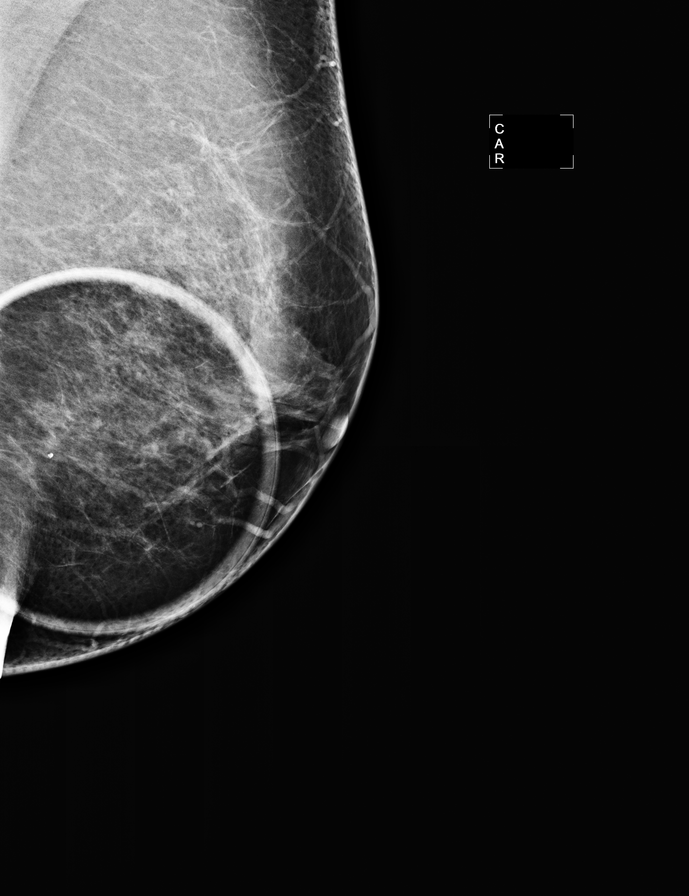

[L CC (3 of 3)]
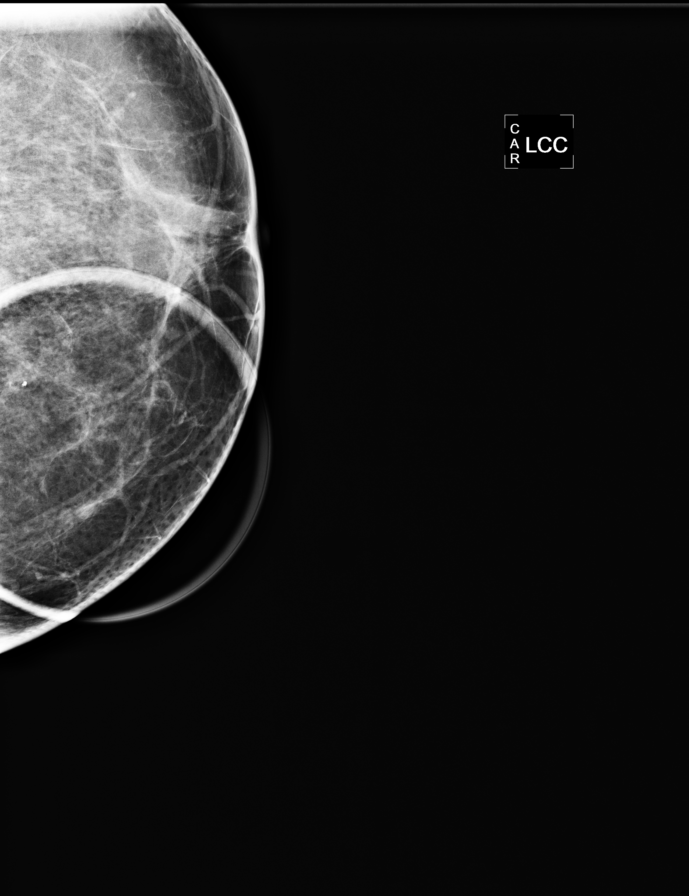

[7 of 7 positions shown; findings below may reference images not displayed]

IMPRESSION: No findings worrisome for developing malignancy.
Recommend screening mammography in 1 year.

BI-RADS CATEGORY 2:  Benign finding(s).

Addended by:  MUHAMMED MUSA, M.D. on [DATE] [DATE].

***END ADDENDUM*** SIGNED BY: MUHAMMED MUSA, M.D.
The patient has previously undergone mammography at
[REDACTED].  Attempts will be made to obtain these prior
studies.
FINDINGS: There is a scattered fibroglandular pattern.  There is a
small partially obscured nodule located laterally within the left
breast on the CC view (not well visualized on the MLO view).  There
is no distortion or worrisome calcification within either breast.
Mammographic images were processed with CAD.

On physical exam, there is no discrete palpable abnormality within
the lateral left breast or upper-outer quadrant of the left breast.

Ultrasound is performed, showing normal-appearing fibroglandular
tissue within the lateral left breast and upper-outer quadrant of
the left breast.  There are several tiny benign appearing cysts
located within the left breast.  There are two adjacent cysts
located at the 4 o'clock position 5 cm from nipple each measuring 4
mm in size.  In addition, there is a 4 mm simple cyst located
within the left breast at the 2 o'clock position 3 cm from the
nipple.  There is no mass, distortion, or worrisome shadowing.
IMPRESSION: Small simple cysts located within the left breast at the two and
four o'clock positions as discussed above.  No findings worrisome
for developing malignancy.  Attempts will be made to obtain prior
mammograms from [REDACTED] for comparison purposes.  When these
become available, the report will be addended.  Recommend screening
mammography in 1 year.

BI-RADS CATEGORY 0:  Incomplete.  Need additional imaging
evaluation and/or prior mammograms for comparison.

## 2012-06-16 ENCOUNTER — Encounter (HOSPITAL_COMMUNITY): Payer: Self-pay | Admitting: Emergency Medicine

## 2012-06-16 ENCOUNTER — Emergency Department (INDEPENDENT_AMBULATORY_CARE_PROVIDER_SITE_OTHER)
Admission: EM | Admit: 2012-06-16 | Discharge: 2012-06-16 | Disposition: A | Discharge status: Home / Self Care | Payer: Self-pay | Source: Home / Self Care | Attending: Family Medicine | Admitting: Family Medicine

## 2012-06-16 ENCOUNTER — Emergency Department (INDEPENDENT_AMBULATORY_CARE_PROVIDER_SITE_OTHER): Payer: Self-pay

## 2012-06-16 DIAGNOSIS — J40 Bronchitis, not specified as acute or chronic: Secondary | ICD-10-CM

## 2012-06-16 IMAGING — CR DG CHEST 2V
2 series · 2 of 2 positions shown · non-contrast
Comparison: [DATE]

CLINICAL DATA: Cough, fever.

CHEST - 2 VIEW

[view not recorded (1 of 2)]
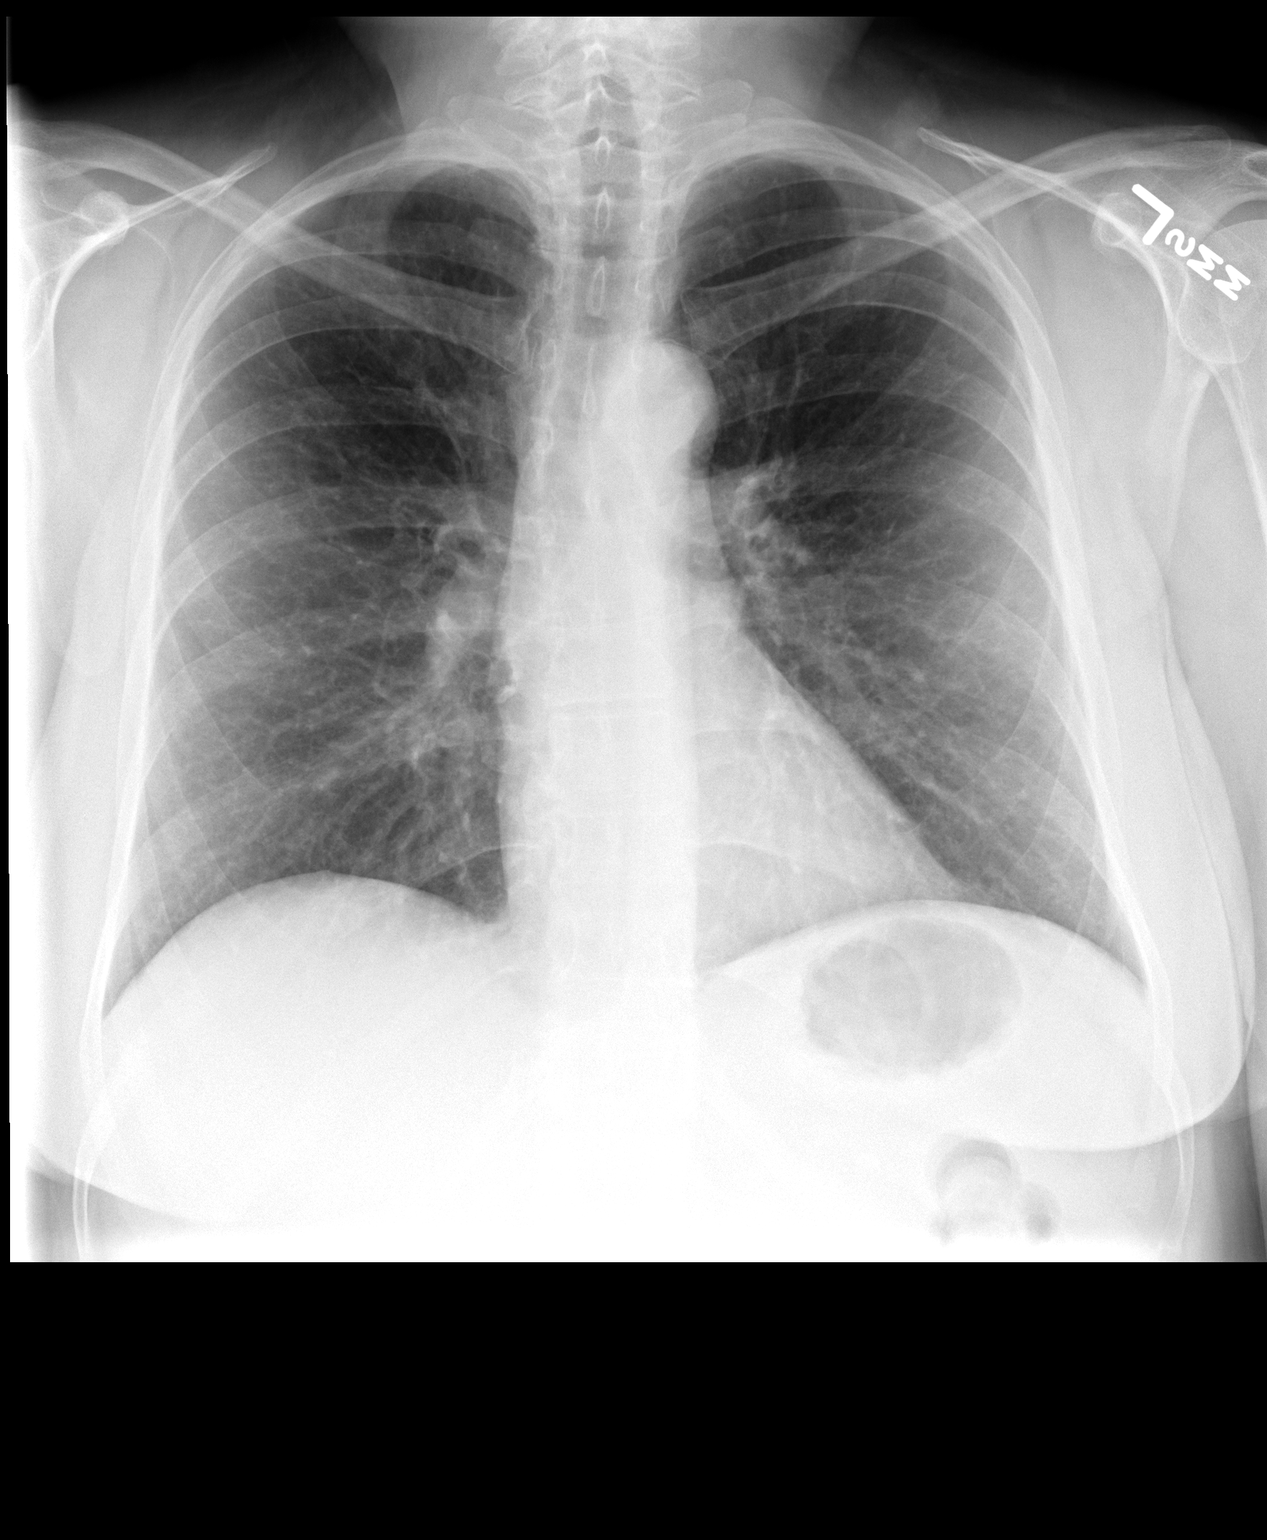

[view not recorded (2 of 2)]
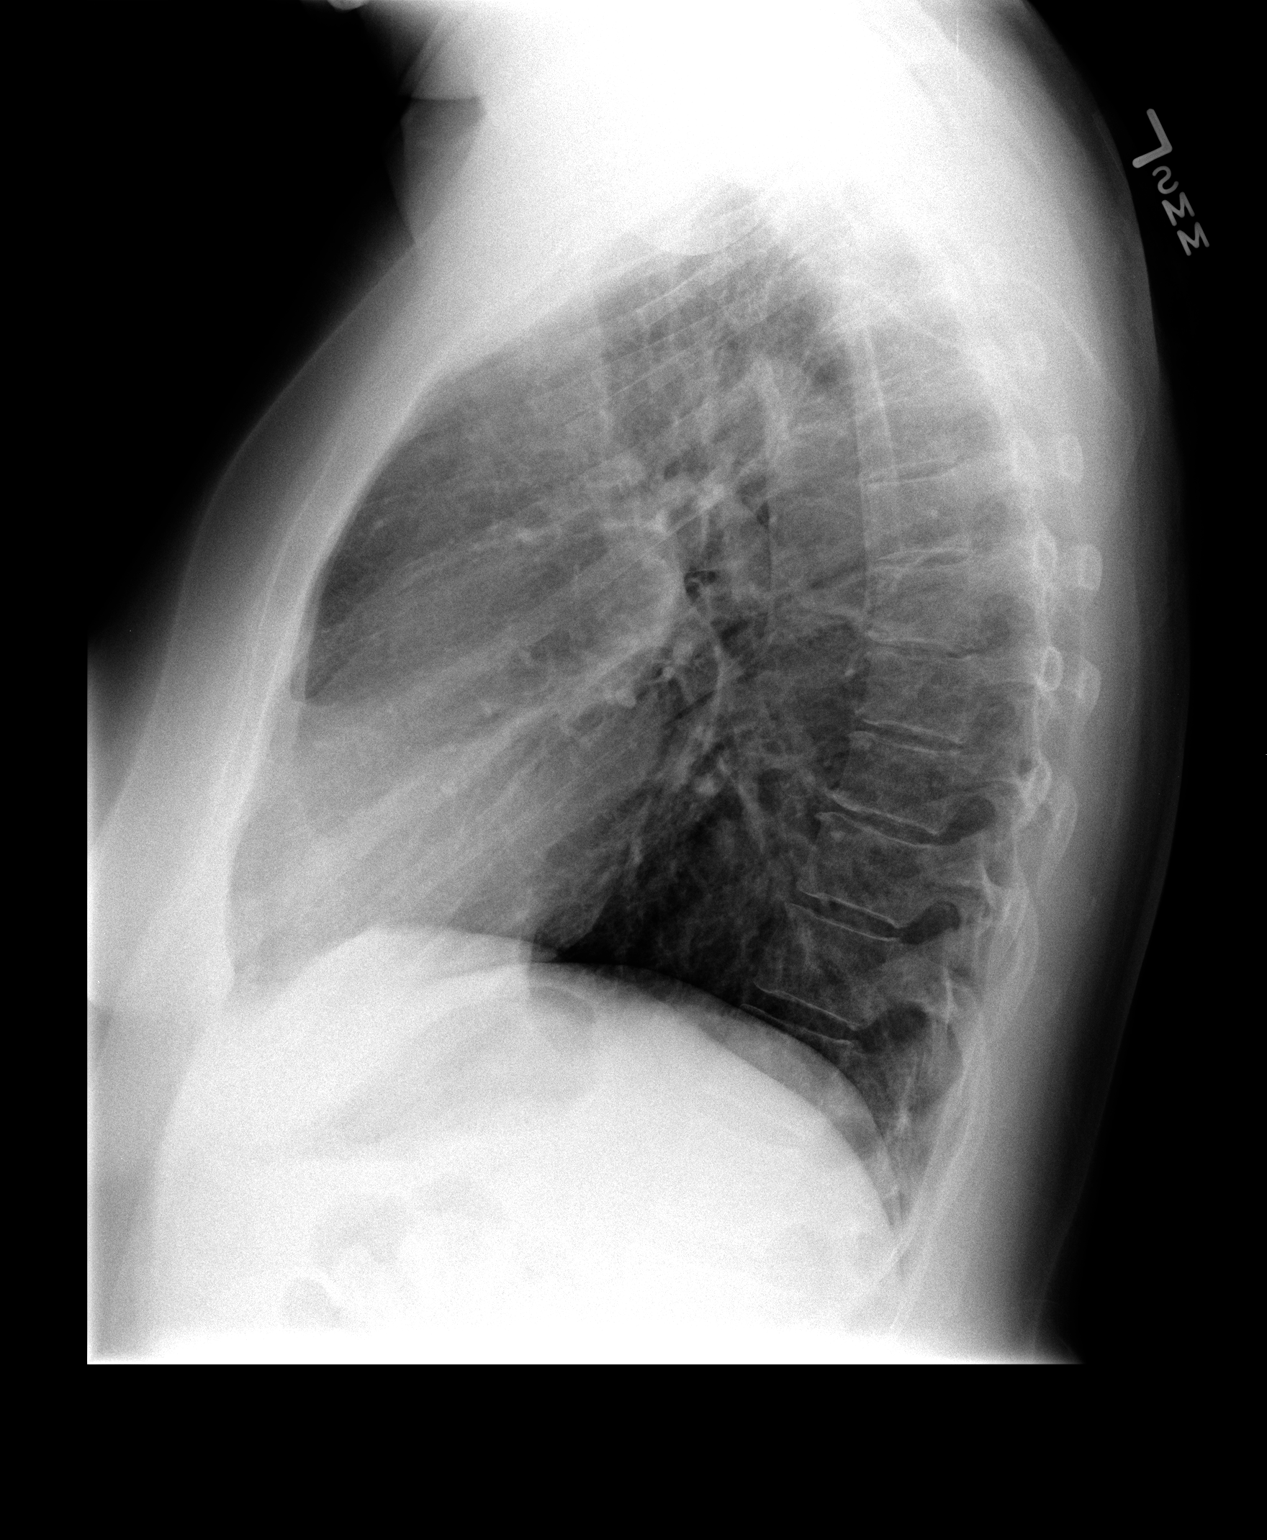

[2 of 2 positions shown; findings below may reference images not displayed]

FINDINGS: Heart and mediastinal contours are within normal limits.
No focal opacities or effusions.  No acute bony abnormality.
IMPRESSION: No active cardiopulmonary disease.

## 2012-06-16 MED ORDER — AMOXICILLIN 500 MG PO CAPS: 500.0000 mg | ORAL_CAPSULE | Freq: Three times a day (TID) | ORAL | Status: DC

## 2012-06-16 MED ORDER — ALBUTEROL SULFATE (5 MG/ML) 0.5% IN NEBU
5.0000 mg | INHALATION_SOLUTION | Freq: Once | RESPIRATORY_TRACT | Status: AC
  Administered 2012-06-16: 5 mg via RESPIRATORY_TRACT

## 2012-06-16 MED ORDER — ALBUTEROL SULFATE (5 MG/ML) 0.5% IN NEBU
INHALATION_SOLUTION | RESPIRATORY_TRACT | Status: AC
  Filled 2012-06-16: qty 1

## 2012-06-16 MED ORDER — ALBUTEROL SULFATE HFA 108 (90 BASE) MCG/ACT IN AERS: 1.0000 | INHALATION_SPRAY | Freq: Four times a day (QID) | RESPIRATORY_TRACT | Status: DC | PRN

## 2012-06-16 NOTE — ED Provider Notes (Signed)
History     CSN: [id number]  Arrival date & time 06/16/12  1002   First MD Initiated Contact with Patient 06/16/12 1021      Chief Complaint  Patient presents with  . URI    (Consider location/radiation/quality/duration/timing/severity/associated sxs/prior treatment) Patient is a 52 y.o. female presenting with URI. The history is provided by the patient. No language interpreter was used.  URI Presenting symptoms: congestion, cough and fever   Severity:  Severe Onset quality:  Gradual Timing:  Constant Progression:  Worsening Chronicity:  New Relieved by:  Nothing Worsened by:  Nothing tried Ineffective treatments:  OTC medications and prescription medications Risk factors: recent illness   Pt complains of chest feeling tight,  Cough and congestion.  Hx of bronchitis this tome of year  Past Medical History  Diagnosis Date  . Asthma     Past Surgical History  Procedure Laterality Date  . Abdominal hysterectomy    . Appendectomy    . Tonsillectomy    . Endometrosis      Family History  Problem Relation Age of Onset  . Diabetes Father   . Heart disease Father   . Hypertension Father   . Cancer Paternal Grandmother     breast cancer    History  Substance Use Topics  . Smoking status: Current Every Day Smoker -- 0.50 packs/day    Types: Cigarettes    Last Attempt to Quit: 12/09/2010  . Smokeless tobacco: Never Used  . Alcohol Use: No    OB History   Grav Para Term Preterm Abortions TAB SAB Ect Mult Living   2 2 2       2       Review of Systems  Constitutional: Positive for fever.  HENT: Positive for congestion.   Respiratory: Positive for cough.   All other systems reviewed and are negative.    Allergies  Codeine  Home Medications   Current Outpatient Rx  Name  Route  Sig  Dispense  Refill  . benzonatate (TESSALON) 100 MG capsule   Oral   Take 100 mg by mouth 2 (two) times daily as needed.           Marland Kitchen albuterol-ipratropium (COMBIVENT)  18-103 MCG/ACT inhaler   Inhalation   Inhale 2 puffs into the lungs every 4 (four) hours as needed.           . doxycycline (DORYX) 100 MG DR capsule   Oral   Take 100 mg by mouth 2 (two) times daily.           . predniSONE 5 MG/5ML solution   Oral   Take by mouth daily.             BP 126/90  Pulse 73  Temp(Src) 96.8 F (36 C) (Oral)  Resp 16  SpO2 99%  Physical Exam  Nursing note and vitals reviewed. Constitutional: She is oriented to person, place, and time. She appears well-developed and well-nourished.  HENT:  Head: Normocephalic.  Right Ear: External ear normal.  Left Ear: External ear normal.  Nose: Nose normal.  Mouth/Throat: Oropharynx is clear and moist.  Eyes: Conjunctivae and EOM are normal. Pupils are equal, round, and reactive to light.  Neck: Normal range of motion.  Pulmonary/Chest: Effort normal. She has wheezes.  Abdominal: Soft. She exhibits no distension.  Musculoskeletal: Normal range of motion.  Neurological: She is alert and oriented to person, place, and time.  Skin: Skin is warm and dry.  Psychiatric: She has  a normal mood and affect.    ED Course  Procedures (including critical care time)  Labs Reviewed - No data to display No results found.   1. Bronchitis       MDM  Chest xray no pneumonia.   Pt given rx for albuterol inhaler and amoxicillian.         Lonia Skinner [place], PA-C 06/16/12 1154  Lonia Skinner [place], PA-C 06/16/12 1156

## 2012-06-16 NOTE — ED Provider Notes (Signed)
Medical screening examination/treatment/procedure(s) were performed by non-physician practitioner and as supervising physician I was immediately available for consultation/collaboration.  Raynald Blend, MD 06/16/12 1233

## 2012-06-16 NOTE — ED Notes (Signed)
Pt C/o persistent no productive cough with chest pain x 2 days. Had leftover Tessalon Perles that she used with mild relief. Also has been taking Nyquil and Robitussin. No fever. Has seasonal allergies and was working out in the yard. Feels like she can't breathe. Pt is alert and oriented.

## 2012-07-31 ENCOUNTER — Emergency Department (HOSPITAL_COMMUNITY): Payer: Self-pay

## 2012-07-31 ENCOUNTER — Encounter (HOSPITAL_COMMUNITY): Payer: Self-pay

## 2012-07-31 ENCOUNTER — Emergency Department (HOSPITAL_COMMUNITY)
Admission: EM | Admit: 2012-07-31 | Discharge: 2012-07-31 | Disposition: A | Discharge status: Home / Self Care | Payer: Self-pay | Attending: Emergency Medicine | Admitting: Emergency Medicine

## 2012-07-31 DIAGNOSIS — R350 Frequency of micturition: Secondary | ICD-10-CM | POA: Insufficient documentation

## 2012-07-31 DIAGNOSIS — R6883 Chills (without fever): Secondary | ICD-10-CM | POA: Insufficient documentation

## 2012-07-31 DIAGNOSIS — Z791 Long term (current) use of non-steroidal anti-inflammatories (NSAID): Secondary | ICD-10-CM | POA: Insufficient documentation

## 2012-07-31 DIAGNOSIS — R1084 Generalized abdominal pain: Secondary | ICD-10-CM | POA: Insufficient documentation

## 2012-07-31 DIAGNOSIS — M545 Low back pain, unspecified: Secondary | ICD-10-CM | POA: Insufficient documentation

## 2012-07-31 DIAGNOSIS — R112 Nausea with vomiting, unspecified: Secondary | ICD-10-CM | POA: Insufficient documentation

## 2012-07-31 DIAGNOSIS — R109 Unspecified abdominal pain: Secondary | ICD-10-CM

## 2012-07-31 DIAGNOSIS — F17200 Nicotine dependence, unspecified, uncomplicated: Secondary | ICD-10-CM | POA: Insufficient documentation

## 2012-07-31 DIAGNOSIS — Z79899 Other long term (current) drug therapy: Secondary | ICD-10-CM | POA: Insufficient documentation

## 2012-07-31 DIAGNOSIS — J45909 Unspecified asthma, uncomplicated: Secondary | ICD-10-CM | POA: Insufficient documentation

## 2012-07-31 DIAGNOSIS — Z90710 Acquired absence of both cervix and uterus: Secondary | ICD-10-CM | POA: Insufficient documentation

## 2012-07-31 DIAGNOSIS — R0602 Shortness of breath: Secondary | ICD-10-CM | POA: Insufficient documentation

## 2012-07-31 LAB — CBC WITH DIFFERENTIAL/PLATELET
Basophils Absolute: 0 10*3/uL (ref 0.0–0.1)
Eosinophils Relative: 2 % (ref 0–5)
Lymphocytes Relative: 37 % (ref 12–46)
MCV: 81.6 fL (ref 78.0–100.0)
Neutrophils Relative %: 56 % (ref 43–77)
Platelets: 247 10*3/uL (ref 150–400)
RBC: 5.17 MIL/uL — ABNORMAL HIGH (ref 3.87–5.11)
RDW: 13 % (ref 11.5–15.5)
WBC: 6.2 10*3/uL (ref 4.0–10.5)

## 2012-07-31 LAB — COMPREHENSIVE METABOLIC PANEL
ALT: 34 U/L (ref 0–35)
AST: 25 U/L (ref 0–37)
Alkaline Phosphatase: 70 U/L (ref 39–117)
CO2: 29 mEq/L (ref 19–32)
Calcium: 10.1 mg/dL (ref 8.4–10.5)
GFR calc non Af Amer: >90 mL/min (ref >90)
Potassium: 4 mEq/L (ref 3.5–5.1)
Sodium: 142 mEq/L (ref 135–145)
Total Protein: 7.1 g/dL (ref 6.0–8.3)

## 2012-07-31 LAB — URINALYSIS, ROUTINE W REFLEX MICROSCOPIC
Bilirubin Urine: NEGATIVE
Glucose, UA: NEGATIVE mg/dL
Hgb urine dipstick: NEGATIVE
Specific Gravity, Urine: 1.005 (ref 1.005–1.030)

## 2012-07-31 IMAGING — CT CT ABD-PELV W/ CM
2 of 5 series · 17 of 46 positions shown, 19 images · IV contrast (APPLIED)
Comparison: [plain films from   the same day, and earlier studies]

CLINICAL DATA: Abdominal pain, back pain.

CT ABDOMEN AND PELVIS WITH CONTRAST
TECHNIQUE: Multidetector CT imaging of the abdomen and pelvis was
performed following the standard protocol during bolus
administration of intravenous contrast.
Contrast: 100mL OMNIPAQUE IOHEXOL 300 MG/ML  SOLN

[Series 2: abd/pelv with 5.0 b31f st · axial · 0.73mm/px · z∈[+842,+1252]mm · 14 of 94 slices shown, 16 images]
[im 6/94  soft-tissue]
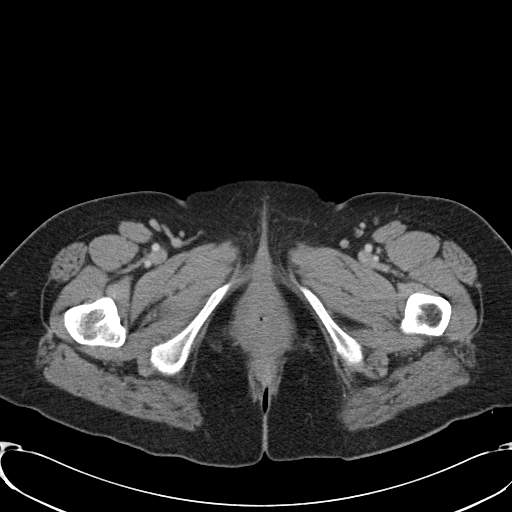
[im 6/94  bone]
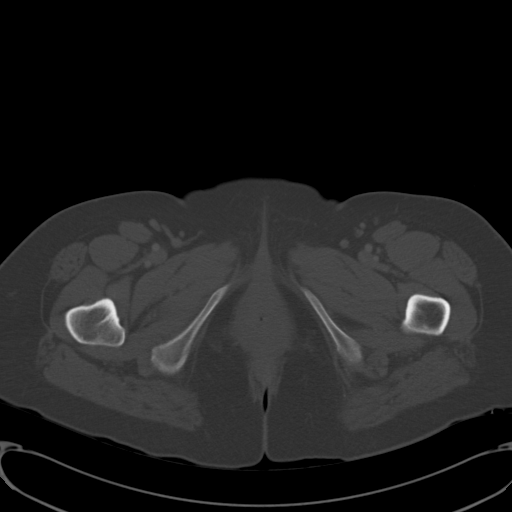
[im 11/94  soft-tissue]
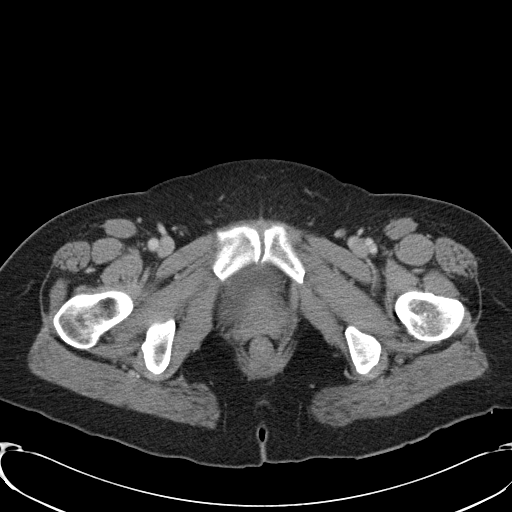
[im 21/94  soft-tissue]
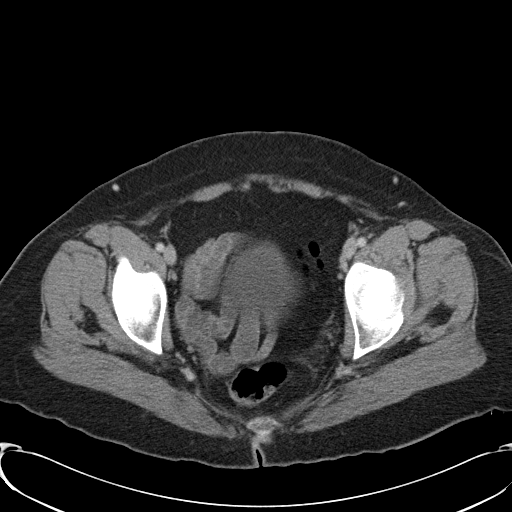
[im 26/94  soft-tissue]
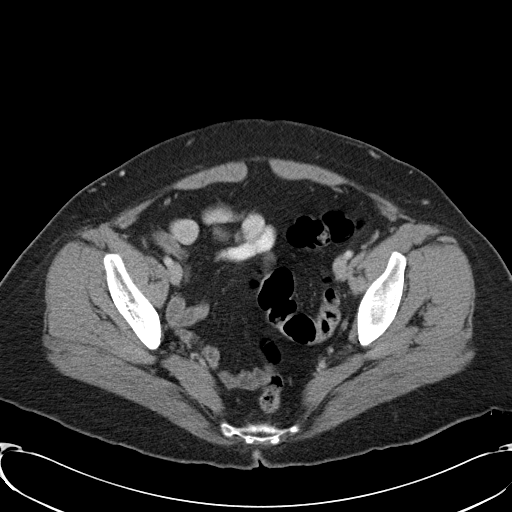
[im 32/94  soft-tissue]
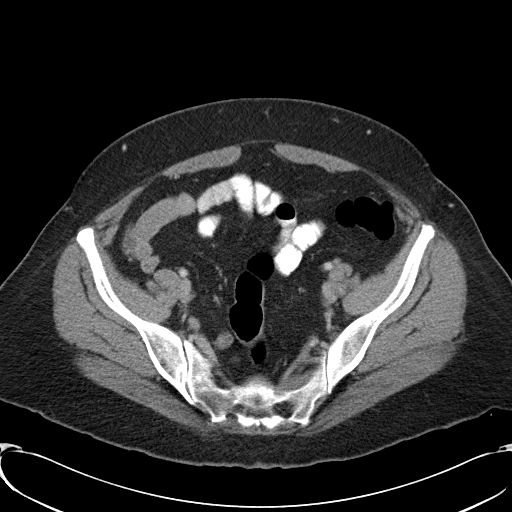
[im 37/94  soft-tissue]
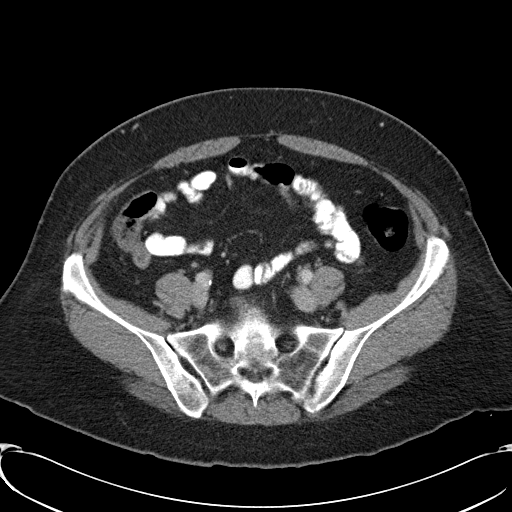
[im 42/94  soft-tissue]
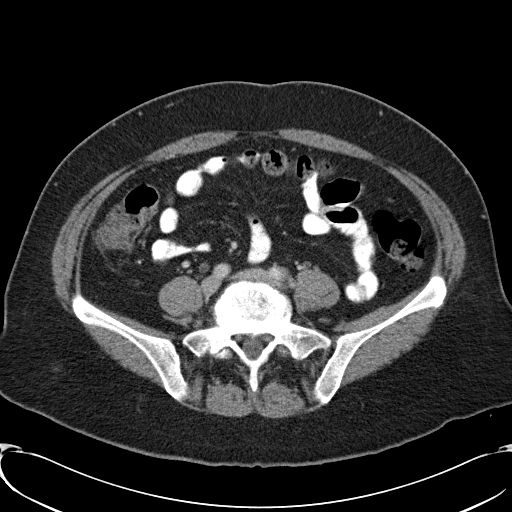
[im 52/94  soft-tissue]
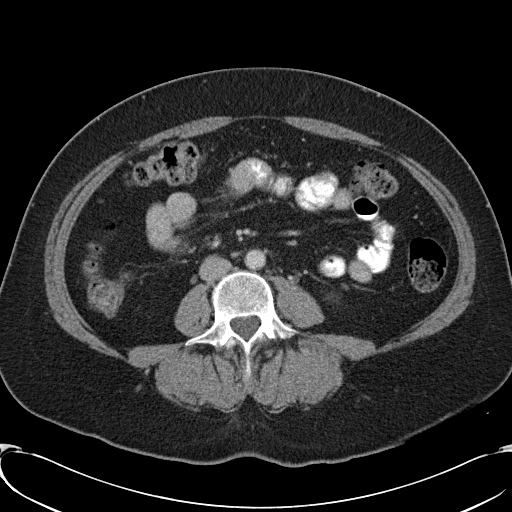
[im 57/94  soft-tissue]
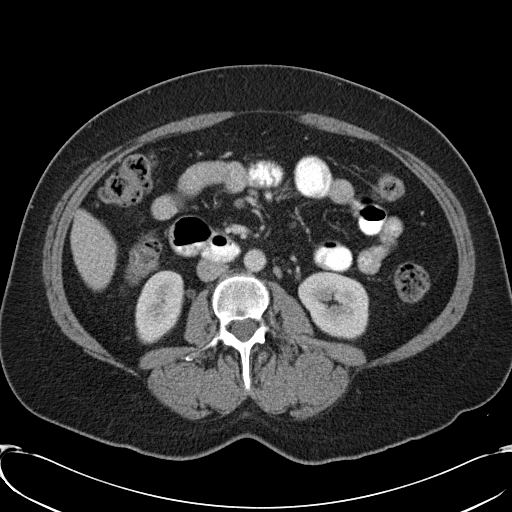
[im 57/94  bone]
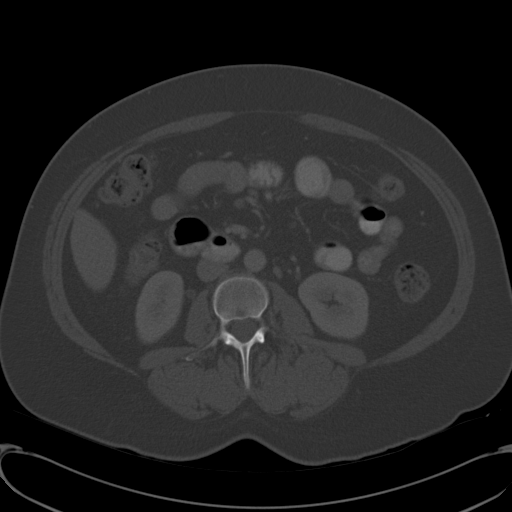
[im 63/94  soft-tissue]
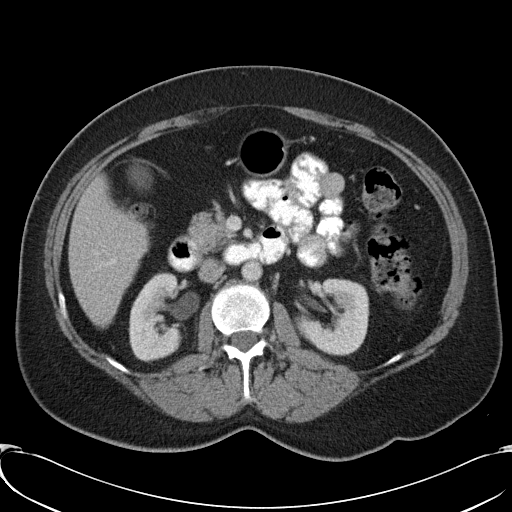
[im 68/94  soft-tissue]
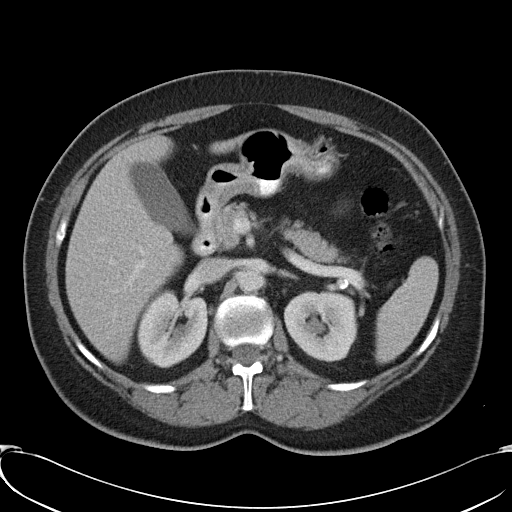
[im 73/94  soft-tissue]
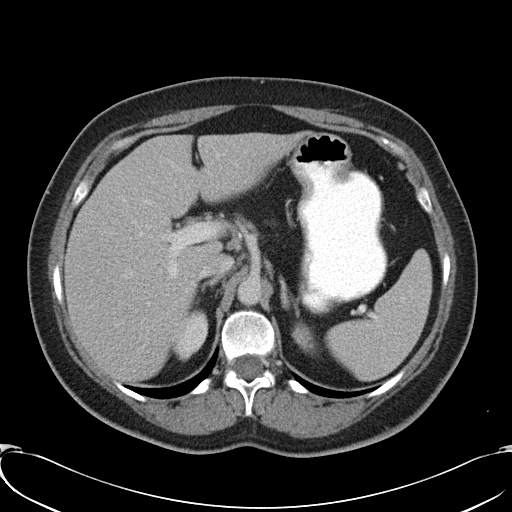
[im 83/94  soft-tissue]
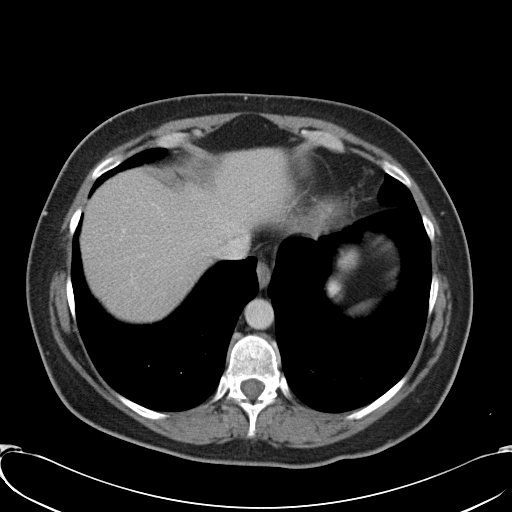
[im 88/94  soft-tissue]
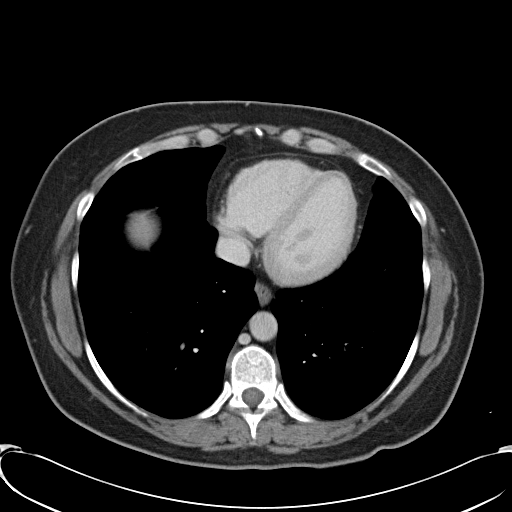

[Series 5: coronals · coronal · 0.91mm/px · 3 of 143 slices shown]
[im 48/143  soft-tissue]
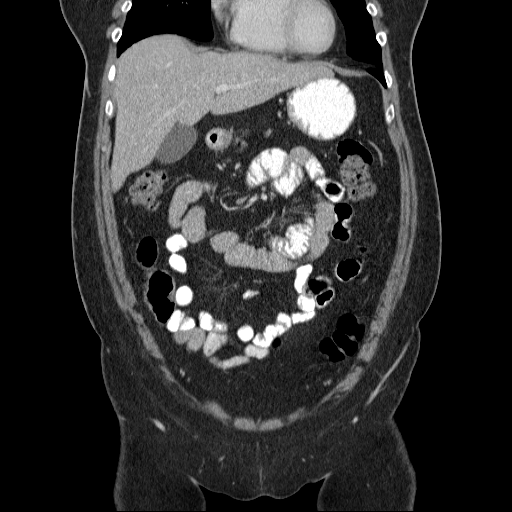
[im 64/143  soft-tissue]
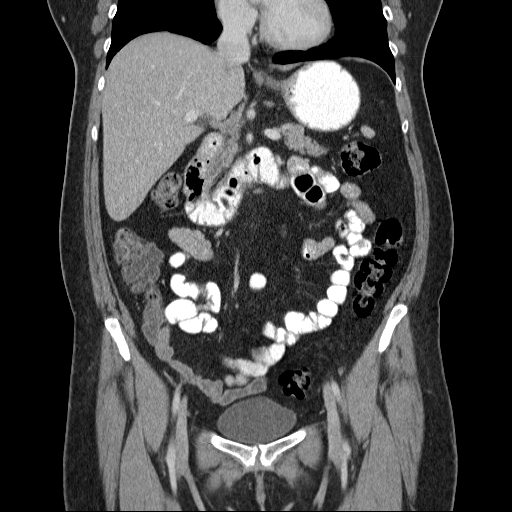
[im 79/143  soft-tissue]
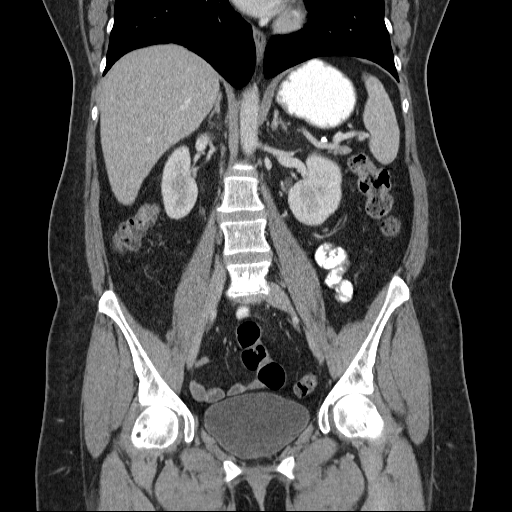

[17 of 46 positions shown; findings below may reference images not displayed]

FINDINGS: Visualized lung bases clear.  Unremarkable liver,
nondistended gallbladder, spleen and accessory splenule, adrenal
glands, kidneys, pancreas, abdominal aorta}.  Portal vein patent.
Stomach, small bowel, and colon are nondilated.  Appendix
surgically absent.  Urinary bladder physiologically distended.
Left pelvic phlebolith.  Previous hysterectomy.  No ascites.  No
free air.  No adenopathy localized. There is normal renal excretion
bilaterally on delayed scans.  Degenerative disc disease L5-S1.
IMPRESSION: 1.  No acute   process.
2.  Degenerative disc disease L5-S1.

## 2012-07-31 IMAGING — CR DG ABDOMEN ACUTE W/ 1V CHEST
3 series · 3 of 3 positions shown · non-contrast
Comparison: [DATE]

CLINICAL DATA: Worsening abdominal pain

ACUTE ABDOMEN SERIES (ABDOMEN 2 VIEW & CHEST 1 VIEW)

[w chest pa]
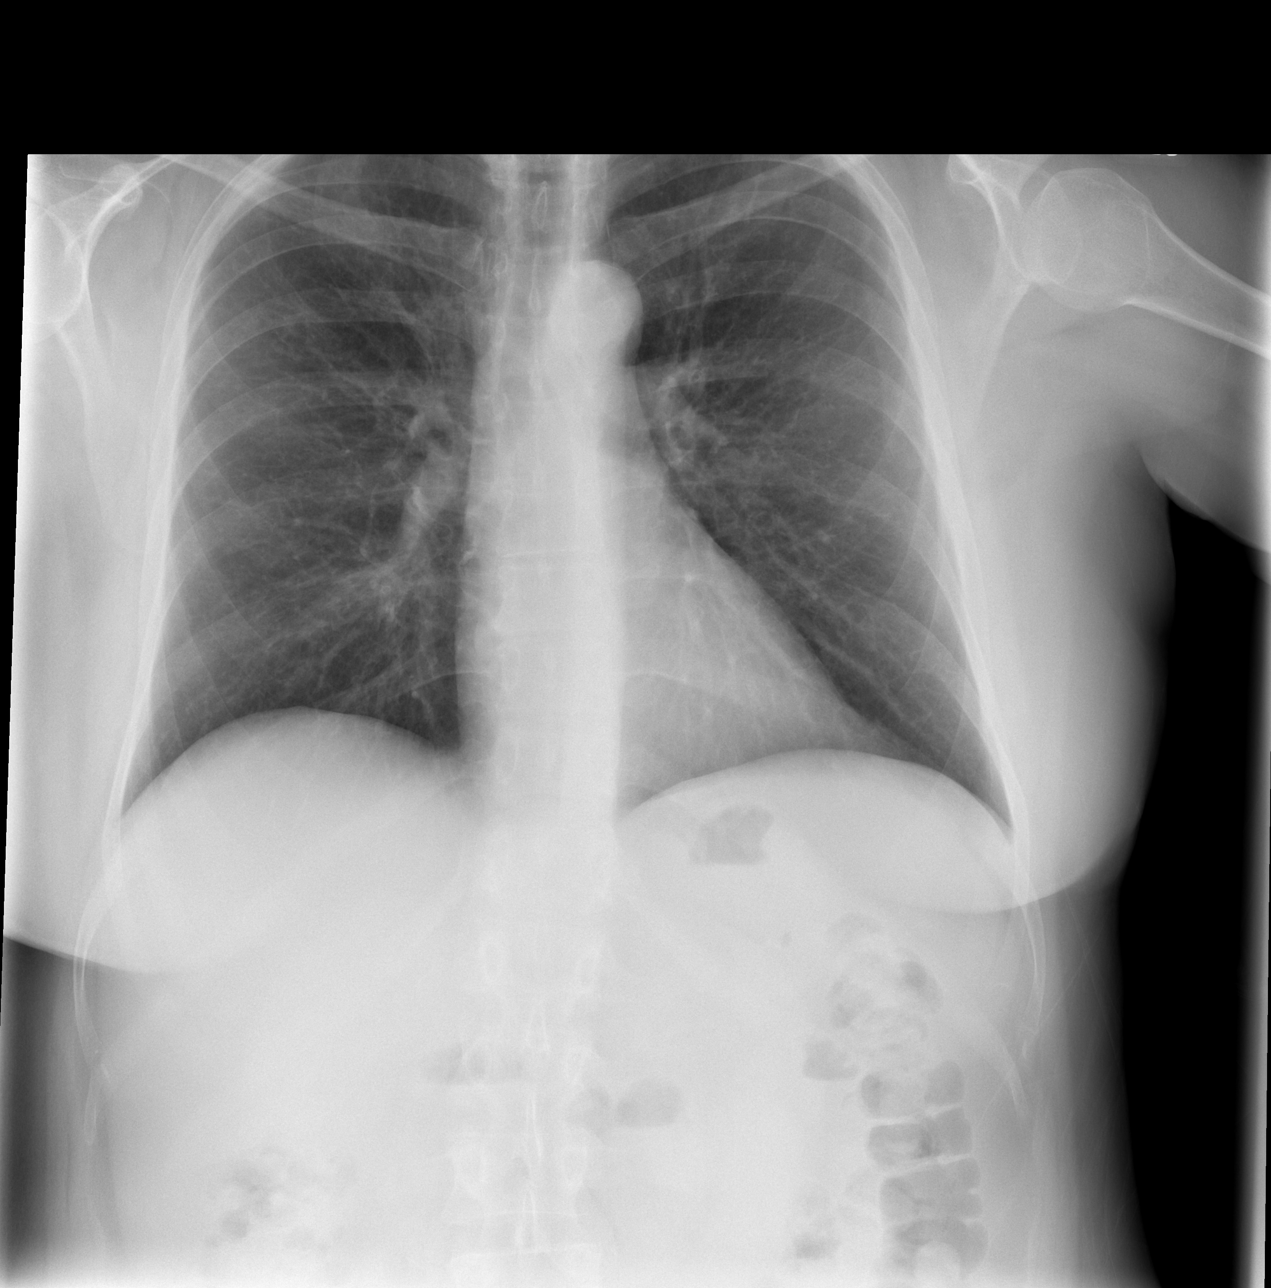

[w abdomen upright]
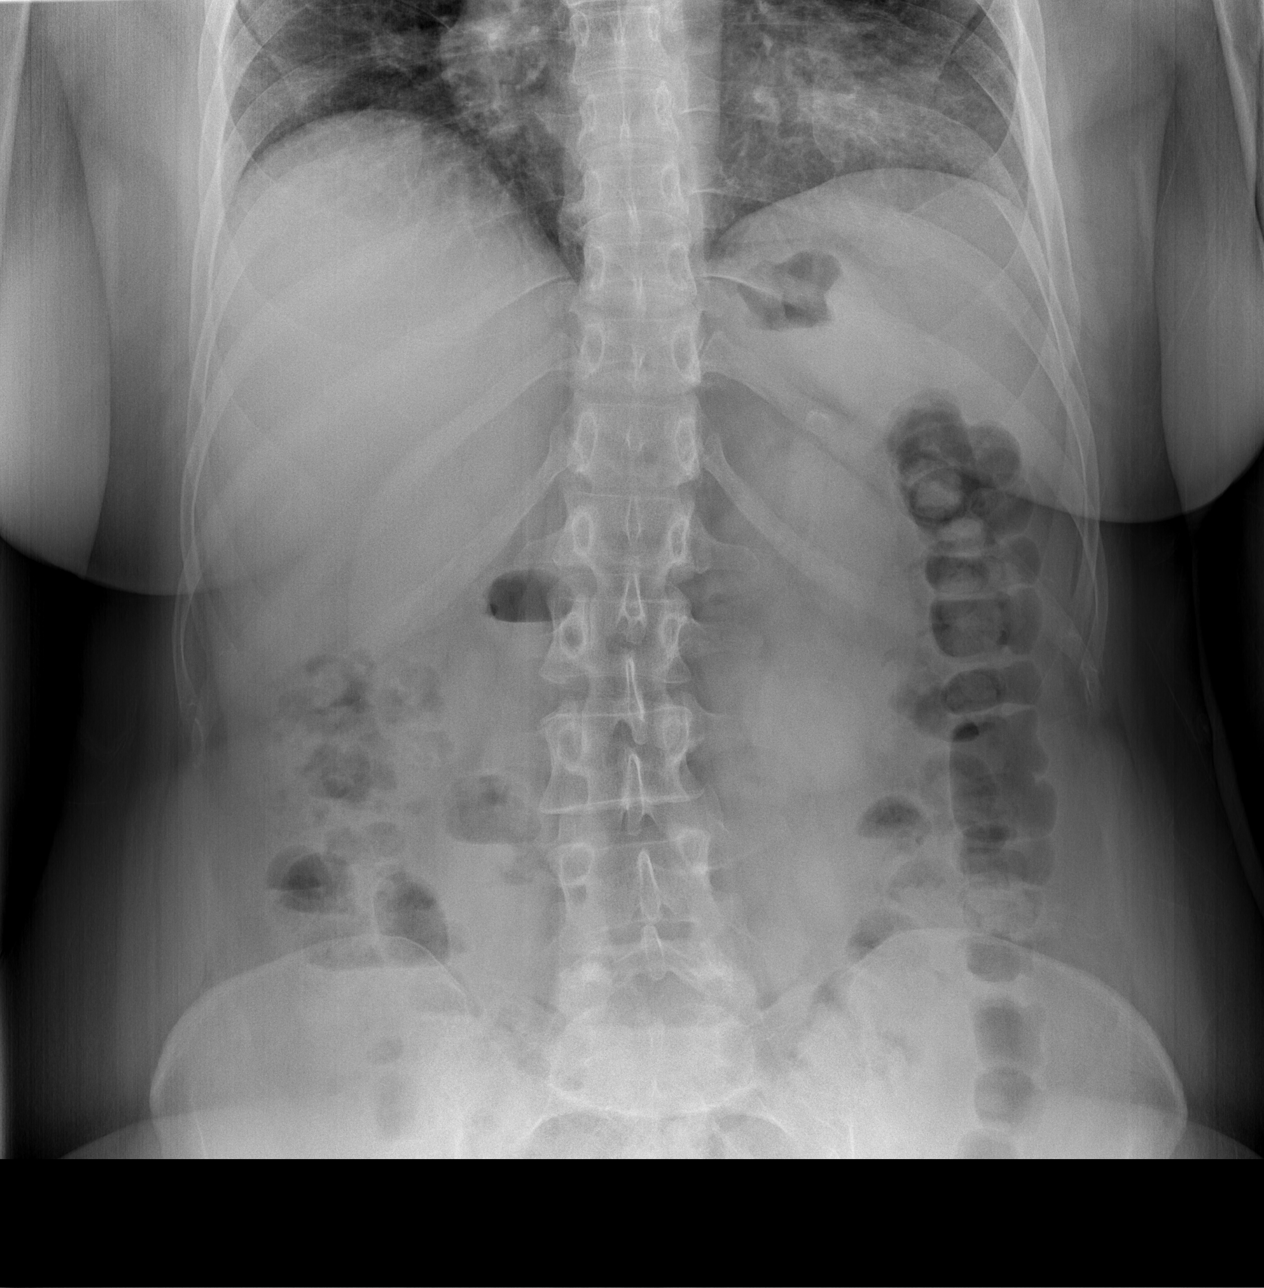

[t abdomen supine]
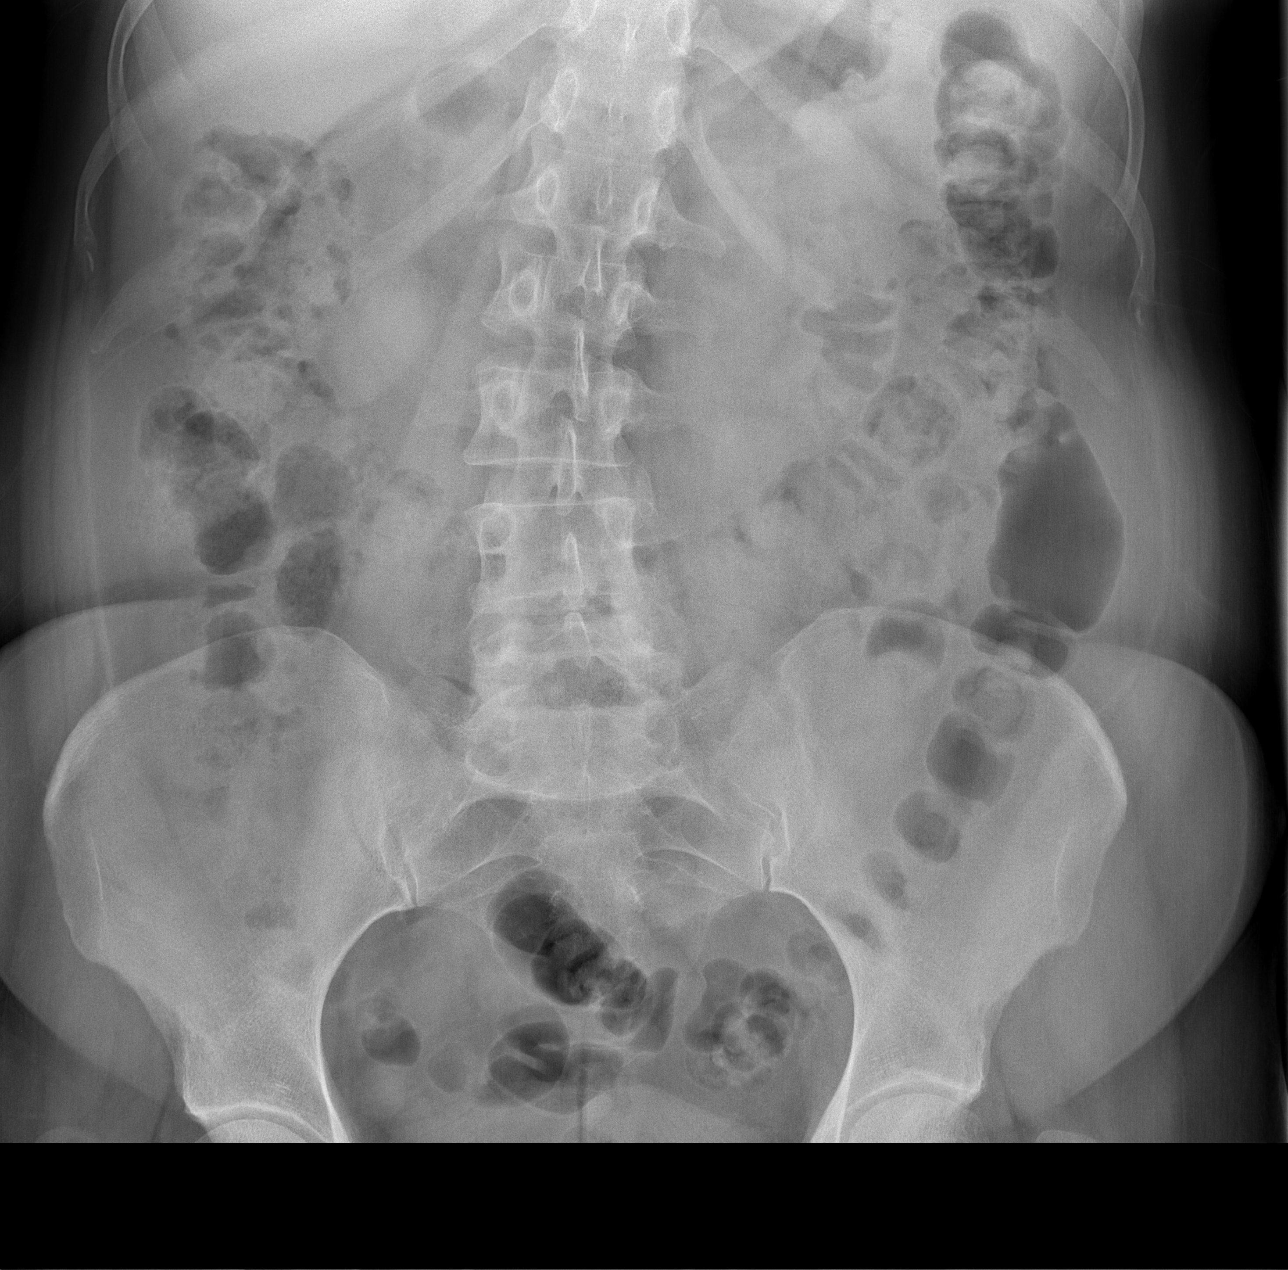

[3 of 3 positions shown; findings below may reference images not displayed]

FINDINGS: Cardiomediastinal silhouette is stable.  No acute
infiltrate or pulmonary edema.  Mild distended small bowel loops in
the right abdomen with some air fluid levels suspicious for early
bowel obstruction.  No free abdominal air.
IMPRESSION: No acute disease within chest.  Mild distended small bowel loops
with air-fluid levels in right abdomen suspicious for early bowel
obstruction.

## 2012-07-31 MED ORDER — MORPHINE SULFATE 4 MG/ML IJ SOLN
4.0000 mg | Freq: Once | INTRAMUSCULAR | Status: AC
  Administered 2012-07-31: 4 mg via INTRAVENOUS
  Filled 2012-07-31: qty 1

## 2012-07-31 MED ORDER — POLYETHYLENE GLYCOL 3350 17 GM/SCOOP PO POWD: 17.0000 g | Freq: Every day | ORAL | Status: DC

## 2012-07-31 MED ORDER — SODIUM CHLORIDE 0.9 % IV SOLN
Freq: Once | INTRAVENOUS | Status: AC
  Administered 2012-07-31: 19:00:00 via INTRAVENOUS

## 2012-07-31 MED ORDER — DIPHENHYDRAMINE HCL 50 MG/ML IJ SOLN
25.0000 mg | Freq: Once | INTRAMUSCULAR | Status: DC
  Filled 2012-07-31: qty 1

## 2012-07-31 MED ORDER — HYDROCODONE-ACETAMINOPHEN 5-325 MG PO TABS: 1.0000 | ORAL_TABLET | ORAL | Status: DC | PRN

## 2012-07-31 MED ORDER — ONDANSETRON HCL 4 MG PO TABS: 4.0000 mg | ORAL_TABLET | Freq: Four times a day (QID) | ORAL | Status: DC

## 2012-07-31 MED ORDER — ONDANSETRON HCL 4 MG/2ML IJ SOLN
4.0000 mg | Freq: Once | INTRAMUSCULAR | Status: AC
  Administered 2012-07-31: 4 mg via INTRAVENOUS
  Filled 2012-07-31: qty 2

## 2012-07-31 MED ORDER — IOHEXOL 300 MG/ML  SOLN
25.0000 mL | Freq: Once | INTRAMUSCULAR | Status: AC | PRN
  Administered 2012-07-31: 25 mL via ORAL

## 2012-07-31 MED ORDER — IOHEXOL 300 MG/ML  SOLN
100.0000 mL | Freq: Once | INTRAMUSCULAR | Status: AC | PRN
  Administered 2012-07-31: 100 mL via INTRAVENOUS

## 2012-07-31 NOTE — ED Notes (Signed)
Family at bedside. 

## 2012-07-31 NOTE — ED Notes (Signed)
Pt presents with 2.5 week h/o generalized abdominal and low back pain.  Pt reports no bowel movement x 3 weeks, took husband's diverticulitis medication that "cleaned me out" yesterday, but reports no bowel movement.  Pt reports incontinence of urine, reports increased urine output.  +nausea and vomiting.

## 2012-07-31 NOTE — ED Provider Notes (Signed)
History     CSN: [id number]  Arrival date & time 07/31/12  1328   First MD Initiated Contact with Patient 07/31/12 1712      No chief complaint on file.   (Consider location/radiation/quality/duration/timing/severity/associated sxs/prior treatment) HPI  Patient is a 52 yo F PMHx significant for abdominal hysterectomy, appendectomy presenting to the ED for 2.5 weeks of low abdominal pressure with radiation to low back w/ associated chills, nausea, one episode of non-bloody non-bilious vomiting, and stress incontinence. Pt rates pain 9/10. Pt states she has only had a few small hard BMs since pain started until yesterday when she took her husband's "diverticilius" medication that produced a large watery BM. Pt had a few episodes of SOB during bad pain spells, but denies constant SOB or other respiratory symptoms. No alleviating or aggravating factors.   Past Medical History  Diagnosis Date  . Asthma     Past Surgical History  Procedure Laterality Date  . Abdominal hysterectomy    . Appendectomy    . Tonsillectomy    . Endometrosis      Family History  Problem Relation Age of Onset  . Diabetes Father   . Heart disease Father   . Hypertension Father   . Cancer Paternal Grandmother     breast cancer    History  Substance Use Topics  . Smoking status: Current Every Day Smoker -- 0.50 packs/day    Types: Cigarettes    Last Attempt to Quit: 12/09/2010  . Smokeless tobacco: Never Used  . Alcohol Use: No    OB History   Grav Para Term Preterm Abortions TAB SAB Ect Mult Living   2 2 2       2       Review of Systems  Constitutional: Positive for chills.  HENT: Negative.   Eyes: Negative.   Respiratory: Negative for shortness of breath.   Cardiovascular: Negative for chest pain.  Gastrointestinal: Positive for nausea, vomiting, abdominal pain and abdominal distention.  Genitourinary: Positive for urgency and frequency. Negative for dysuria and hematuria.   Musculoskeletal: Negative.   Skin: Negative.   Neurological: Negative.     Allergies  Codeine  Home Medications   Current Outpatient Rx  Name  Route  Sig  Dispense  Refill  . albuterol (PROVENTIL HFA;VENTOLIN HFA) 108 (90 BASE) MCG/ACT inhaler   Inhalation   Inhale 1-2 puffs into the lungs every 6 (six) hours as needed for wheezing.   1 Inhaler   0   . albuterol-ipratropium (COMBIVENT) 18-103 MCG/ACT inhaler   Inhalation   Inhale 2 puffs into the lungs every 4 (four) hours as needed for wheezing or shortness of breath.          Marland Kitchen ibuprofen (ADVIL,MOTRIN) 200 MG tablet   Oral   Take 600 mg by mouth every 6 (six) hours as needed for pain.         Marland Kitchen zolpidem (AMBIEN) 10 MG tablet   Oral   Take 10 mg by mouth at bedtime as needed for sleep.         Marland Kitchen HYDROcodone-acetaminophen (NORCO/VICODIN) 5-325 MG per tablet   Oral   Take 1 tablet by mouth every 4 (four) hours as needed for pain.   10 tablet   0   . ondansetron (ZOFRAN) 4 MG tablet   Oral   Take 1 tablet (4 mg total) by mouth every 6 (six) hours.   12 tablet   0   . polyethylene glycol powder (GLYCOLAX/MIRALAX) powder  Oral   Take 17 g by mouth daily. Until daily soft stools  OTC   255 g   0     BP 111/70  Pulse 78  Temp(Src) 98.1 F (36.7 C) (Oral)  Resp 16  SpO2 92%  Physical Exam  Constitutional: She is oriented to person, place, and time. She appears well-developed and well-nourished.  HENT:  Head: Normocephalic and atraumatic.  Mouth/Throat: Oropharynx is clear and moist.  Eyes: Conjunctivae are normal.  Neck: Normal range of motion. Neck supple.  Cardiovascular: Normal rate, regular rhythm and normal heart sounds.   Pulmonary/Chest: Effort normal and breath sounds normal.  Abdominal: Soft. Bowel sounds are normal. She exhibits no distension. There is generalized tenderness. There is no rigidity, no rebound, no guarding, no CVA tenderness, no tenderness at McBurney's point and negative  Murphy's sign.  Neurological: She is alert and oriented to person, place, and time.  Skin: Skin is warm and dry.  Psychiatric: She has a normal mood and affect.    ED Course  Procedures (including critical care time)  Labs Reviewed  CBC WITH DIFFERENTIAL - Abnormal; Notable for the following:    RBC 5.17 (*)    All other components within normal limits  COMPREHENSIVE METABOLIC PANEL - Abnormal; Notable for the following:    Glucose, Bld 116 (*)    Total Bilirubin 0.2 (*)    All other components within normal limits  LIPASE, BLOOD  URINALYSIS, ROUTINE W REFLEX MICROSCOPIC   Ct Abdomen Pelvis W Contrast  07/31/2012   *RADIOLOGY REPORT*  Clinical Data: Abdominal pain, back pain.  CT ABDOMEN AND PELVIS WITH CONTRAST  Technique:  Multidetector CT imaging of the abdomen and pelvis was performed following the standard protocol during bolus administration of intravenous contrast.  Contrast: OMNIPAQUE IOHEXOL 300 MG/ML  SOLN  Comparison: plain films from   the same day, and earlier studies  Findings: Visualized lung bases clear.  Unremarkable liver, nondistended gallbladder, spleen and accessory splenule, adrenal glands, kidneys, pancreas, abdominal aorta.  Portal vein patent. Stomach, small bowel, and colon are nondilated.  Appendix surgically absent.  Urinary bladder physiologically distended. Left pelvic phlebolith.  Previous hysterectomy.  No ascites.  No free air.  No adenopathy localized. There is normal renal excretion bilaterally on delayed scans.  Degenerative disc disease L5-S1.  IMPRESSION:  1.  No acute   process. 2.  Degenerative disc disease L5-S1.   Original Report Authenticated By: D. Andria Rhein, MD   Dg Abd Acute W/chest  07/31/2012   *RADIOLOGY REPORT*  Clinical Data: Worsening abdominal pain  ACUTE ABDOMEN SERIES (ABDOMEN 2 VIEW & CHEST 1 VIEW)  Comparison: 06/16/2012  Findings: Cardiomediastinal silhouette is stable.  No acute infiltrate or pulmonary edema.  Mild distended  small bowel loops in the right abdomen with some air fluid levels suspicious for early bowel obstruction.  No free abdominal air.  IMPRESSION: No acute disease within chest.  Mild distended small bowel loops with air-fluid levels in right abdomen suspicious for early bowel obstruction.   Original Report Authenticated By: Natasha Mead, M.D.     1. Abdominal pain       MDM  Patient is nontoxic, nonseptic appearing, in no apparent distress.  Patient's pain and other symptoms adequately managed in emergency department.  Fluid bolus given.  Labs, imaging and vitals reviewed.  Patient does not meet the SIRS or Sepsis criteria.  On repeat exam patient does not have a surgical abdomin and there are nor peritoneal signs.  No indication of appendicitis, bowel obstruction, bowel perforation, cholecystitis, diverticulitis.  Patient discharged home with symptomatic treatment and given strict instructions for follow-up with their primary care physician.  I have also discussed reasons to return immediately to the ER.  Patient expresses understanding and agrees with plan.Patient d/w with Dr. Ethelda Chick, agrees with plan. Patient is stable at time of discharge             Jeannetta Ellis, PA-C 08/01/12 0129

## 2012-07-31 NOTE — ED Notes (Signed)
Pt family member upset with discharge- Candise Bowens PA explained discharge and care in ER. RN re-affirmed. Pt and family agreeable

## 2012-07-31 NOTE — ED Notes (Signed)
Pt sts has not had appetite x2 days, pt has had difficulty sleeping x3 weeks due to abd and back pain- pt did not have BM x3 weeks. Pt has also has had incontinence and polyuria x3 weeks. Pt in NAD at present time. mentating well a&ox4. Fiance at bedside.

## 2012-08-01 NOTE — ED Provider Notes (Signed)
Complains of diffuse abdominal pain for the past 3 weeks. Had bowel movement yesterday after treatment with laxative. Patient feels much improved after treatment in the emergency department on exam abdomen obese mild diffuse tenderness.  Doug Sou, MD 08/01/12 [phone]

## 2012-08-01 NOTE — ED Provider Notes (Signed)
Medical screening examination/treatment/procedure(s) were conducted as a shared visit with non-physician practitioner(s) and myself.  I personally evaluated the patient during the encounter  Doug Sou, MD 08/01/12 [phone]

## 2013-02-18 HISTORY — PX: COLONOSCOPY: SHX174

## 2013-12-20 ENCOUNTER — Encounter (HOSPITAL_COMMUNITY): Payer: Self-pay

## 2014-01-08 ENCOUNTER — Encounter (HOSPITAL_COMMUNITY): Payer: Self-pay | Admitting: *Deleted

## 2014-01-08 ENCOUNTER — Inpatient Hospital Stay (HOSPITAL_COMMUNITY)
Admission: AD | Admit: 2014-01-08 | Discharge: 2014-01-08 | Disposition: A | Discharge status: Home / Self Care | Payer: BC Managed Care – PPO | Attending: Obstetrics & Gynecology | Admitting: Obstetrics & Gynecology

## 2014-01-08 DIAGNOSIS — Z720 Tobacco use: Secondary | ICD-10-CM | POA: Insufficient documentation

## 2014-01-08 DIAGNOSIS — N3001 Acute cystitis with hematuria: Secondary | ICD-10-CM | POA: Diagnosis not present

## 2014-01-08 DIAGNOSIS — N390 Urinary tract infection, site not specified: Secondary | ICD-10-CM | POA: Insufficient documentation

## 2014-01-08 HISTORY — DX: Hypothyroidism, unspecified: E03.9

## 2014-01-08 LAB — URINALYSIS, ROUTINE W REFLEX MICROSCOPIC
Glucose, UA: 100 mg/dL — AB
Ketones, ur: 15 mg/dL — AB
NITRITE: POSITIVE — AB
PROTEIN: 100 mg/dL — AB
UROBILINOGEN UA: 1 mg/dL (ref 0.0–1.0)
pH: 5 (ref 5.0–8.0)

## 2014-01-08 LAB — URINE MICROSCOPIC-ADD ON

## 2014-01-08 MED ORDER — PHENAZOPYRIDINE HCL 200 MG PO TABS: 200.0000 mg | ORAL_TABLET | Freq: Three times a day (TID) | ORAL | Status: DC

## 2014-01-08 MED ORDER — PHENAZOPYRIDINE HCL 100 MG PO TABS
200.0000 mg | ORAL_TABLET | Freq: Once | ORAL | Status: AC
  Administered 2014-01-08: 200 mg via ORAL
  Filled 2014-01-08: qty 2

## 2014-01-08 MED ORDER — CIPROFLOXACIN HCL 500 MG PO TABS
500.0000 mg | ORAL_TABLET | Freq: Once | ORAL | Status: AC
  Administered 2014-01-08: 500 mg via ORAL
  Filled 2014-01-08: qty 1

## 2014-01-08 MED ORDER — CIPROFLOXACIN HCL 500 MG PO TABS: 500.0000 mg | ORAL_TABLET | Freq: Two times a day (BID) | ORAL | Status: DC

## 2014-01-08 NOTE — Discharge Instructions (Signed)
Drink at least 8 8-oz glasses of water every day. Get your prescriptions filled and take all as directed. Seek additional medical care if you have fever, vomiting, worsening back pain or worsening body aches.

## 2014-01-08 NOTE — MAU Note (Signed)
Buring when urinating a few days ago, took AZO, back started hurting that night, this morning noticed blood in her urine.

## 2014-01-08 NOTE — MAU Provider Note (Signed)
History     CSN: [id number]  Arrival date and time: 01/08/14 1023   First Provider Initiated Contact with Patient 01/08/14 1055      Chief Complaint  Patient presents with  . Abdominal Pain   HPI Jamie Mercado 53 y.o. Comes to MAU with bleeding when urinating with lower abdominal pain and back pain.  Has only had one UTI years ago when she had a kidney stone.  Had some burning with urination a few days ago and took some AZO.  Then today awakened with severe lower abdominal pain, frequency, urgency, and blood in her urine.  In past month has had intercourse.  Hx of hysterectomy.  OB History    No data available      Past Medical History  Diagnosis Date  . Hypothyroidism     Past Surgical History  Procedure Laterality Date  . Abdominal hysterectomy    . Appendectomy    . Tubal ligation    . Knee surgery      History reviewed. No pertinent family history.  History  Substance Use Topics  . Smoking status: Current Every Day Smoker    Types: Cigarettes  . Smokeless tobacco: Not on file  . Alcohol Use: Yes    Allergies:  Allergies  Allergen Reactions  . Codeine     No prescriptions prior to admission    Review of Systems  Constitutional: Positive for chills. Negative for fever.  Gastrointestinal: Positive for abdominal pain. Negative for nausea and vomiting.  Genitourinary: Positive for dysuria, urgency, frequency and hematuria. Negative for flank pain.       No vaginal discharge. No vaginal bleeding.   Musculoskeletal: Positive for back pain.   Physical Exam   Blood pressure 132/86, pulse 78, temperature 97.9 F (36.6 C), temperature source Oral, resp. rate 18.  Physical Exam  Nursing note and vitals reviewed. Constitutional: She is oriented to person, place, and time. She appears well-developed and well-nourished. She appears distressed.  HENT:  Head: Normocephalic.  Eyes: EOM are normal.  Neck: Neck supple.  GI: Soft. There is tenderness.   Tender in low midline  Musculoskeletal: Normal range of motion.  Back pain all across the lower back. No CVA tenderness.  Neurological: She is alert and oriented to person, place, and time.  Skin: Skin is warm and dry.  Psychiatric: She has a normal mood and affect.    MAU Course  Procedures  MDM Results for orders placed or performed during the hospital encounter of 01/08/14 (from the past 24 hour(s))  Urinalysis, Routine w reflex microscopic     Status: Abnormal   Collection Time: 01/08/14 10:43 AM  Result Value Ref Range   Color, Urine AMBER (A) YELLOW   APPearance TURBID (A) CLEAR   Specific Gravity, Urine >1.030 (H) 1.005 - 1.030   pH 5.0 5.0 - 8.0   Glucose, UA 100 (A) NEGATIVE mg/dL   Hgb urine dipstick LARGE (A) NEGATIVE   Bilirubin Urine MODERATE (A) NEGATIVE   Ketones, ur 15 (A) NEGATIVE mg/dL   Protein, ur 100 (A) NEGATIVE mg/dL   Urobilinogen, UA 1.0 0.0 - 1.0 mg/dL   Nitrite POSITIVE (A) NEGATIVE   Leukocytes, UA MODERATE (A) NEGATIVE  Urine microscopic-add on     Status: Abnormal   Collection Time: 01/08/14 10:43 AM  Result Value Ref Range   Squamous Epithelial / LPF FEW (A) RARE   WBC, UA TOO NUMEROUS TO COUNT <3 WBC/hpf   RBC / HPF TOO NUMEROUS TO COUNT <  3 RBC/hpf   Bacteria, UA MANY (A) RARE     Assessment and Plan  UTI  Plan Cipro and pyridium started in MAU.  Eprescribed to continue. Drink at least 8 8-oz glasses of water every day. Seek additional medical care if develops fever, nausea, vomiting or body aches.   Brazen Domangue 01/08/2014, 11:02 AM

## 2014-01-11 ENCOUNTER — Encounter (HOSPITAL_COMMUNITY): Payer: Self-pay

## 2014-01-11 LAB — URINE CULTURE: Special Requests: NORMAL

## 2014-03-07 ENCOUNTER — Encounter (HOSPITAL_COMMUNITY): Payer: Self-pay | Admitting: Family Medicine

## 2014-03-07 ENCOUNTER — Emergency Department (HOSPITAL_COMMUNITY)
Admission: EM | Admit: 2014-03-07 | Discharge: 2014-03-07 | Disposition: A | Discharge status: Home / Self Care | Payer: Self-pay | Attending: Emergency Medicine | Admitting: Emergency Medicine

## 2014-03-07 ENCOUNTER — Emergency Department (HOSPITAL_COMMUNITY): Payer: Self-pay

## 2014-03-07 DIAGNOSIS — Z720 Tobacco use: Secondary | ICD-10-CM | POA: Insufficient documentation

## 2014-03-07 DIAGNOSIS — J45901 Unspecified asthma with (acute) exacerbation: Secondary | ICD-10-CM | POA: Insufficient documentation

## 2014-03-07 DIAGNOSIS — Z79899 Other long term (current) drug therapy: Secondary | ICD-10-CM | POA: Insufficient documentation

## 2014-03-07 DIAGNOSIS — E039 Hypothyroidism, unspecified: Secondary | ICD-10-CM | POA: Insufficient documentation

## 2014-03-07 DIAGNOSIS — R05 Cough: Secondary | ICD-10-CM

## 2014-03-07 DIAGNOSIS — J069 Acute upper respiratory infection, unspecified: Secondary | ICD-10-CM | POA: Insufficient documentation

## 2014-03-07 DIAGNOSIS — R059 Cough, unspecified: Secondary | ICD-10-CM

## 2014-03-07 IMAGING — DX DG RIBS W/ CHEST 3+V*L*
4 series · 4 of 4 positions shown · non-contrast
Comparison: Chest radiograph [DATE]

CLINICAL DATA: Left axillary pain for 3 days. Cough for past 2
weeks

EXAM:
LEFT RIBS AND CHEST - 3+ VIEW

[chest pa]
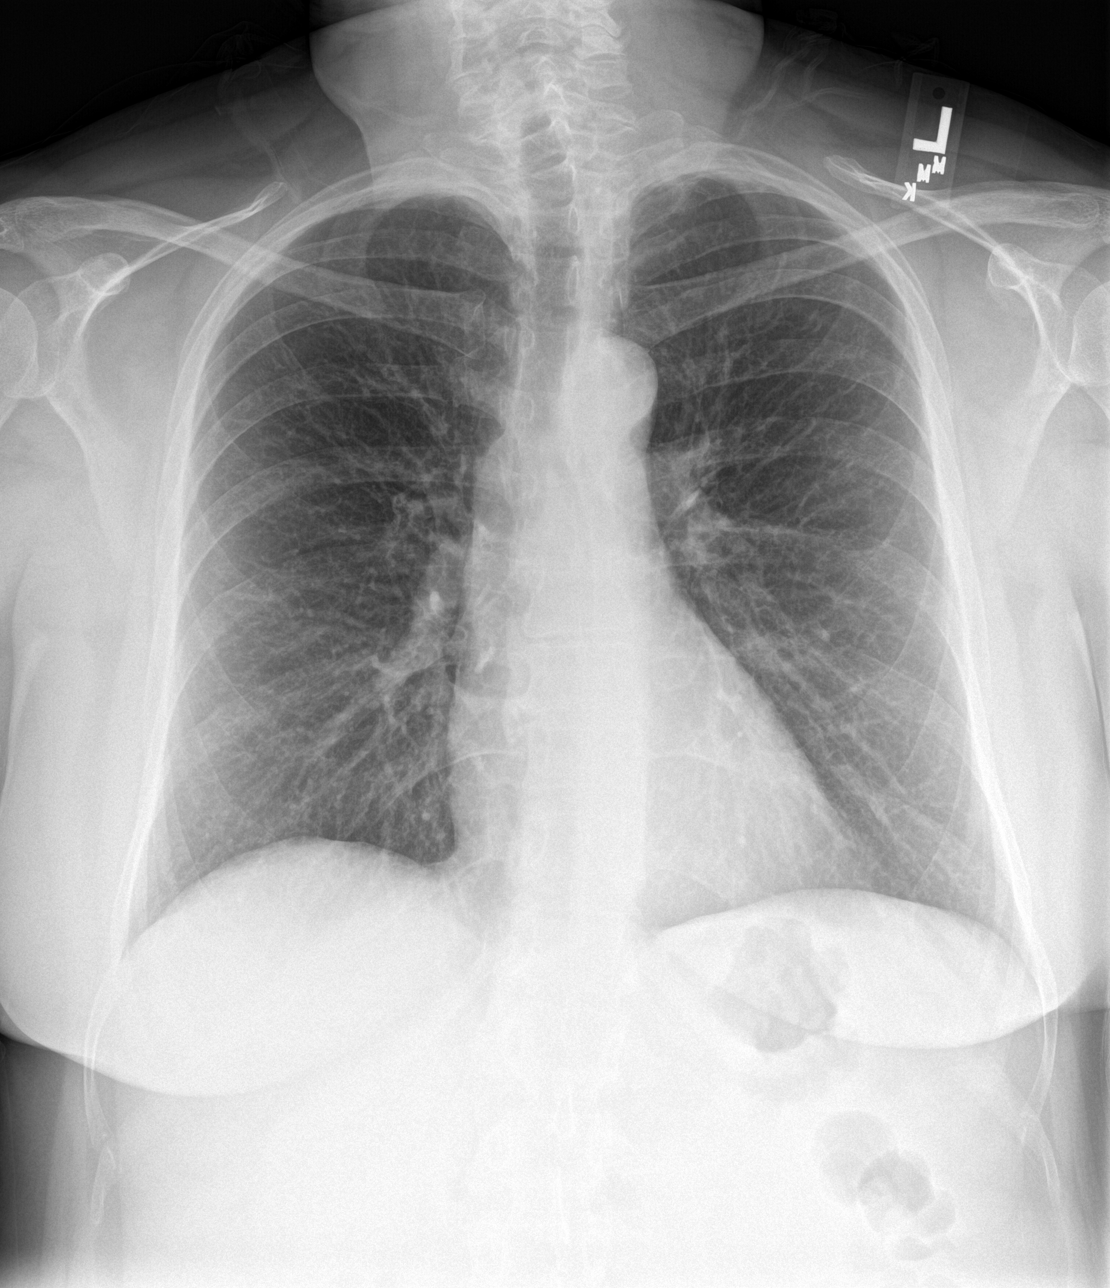

[rib ap (1 of 2)]
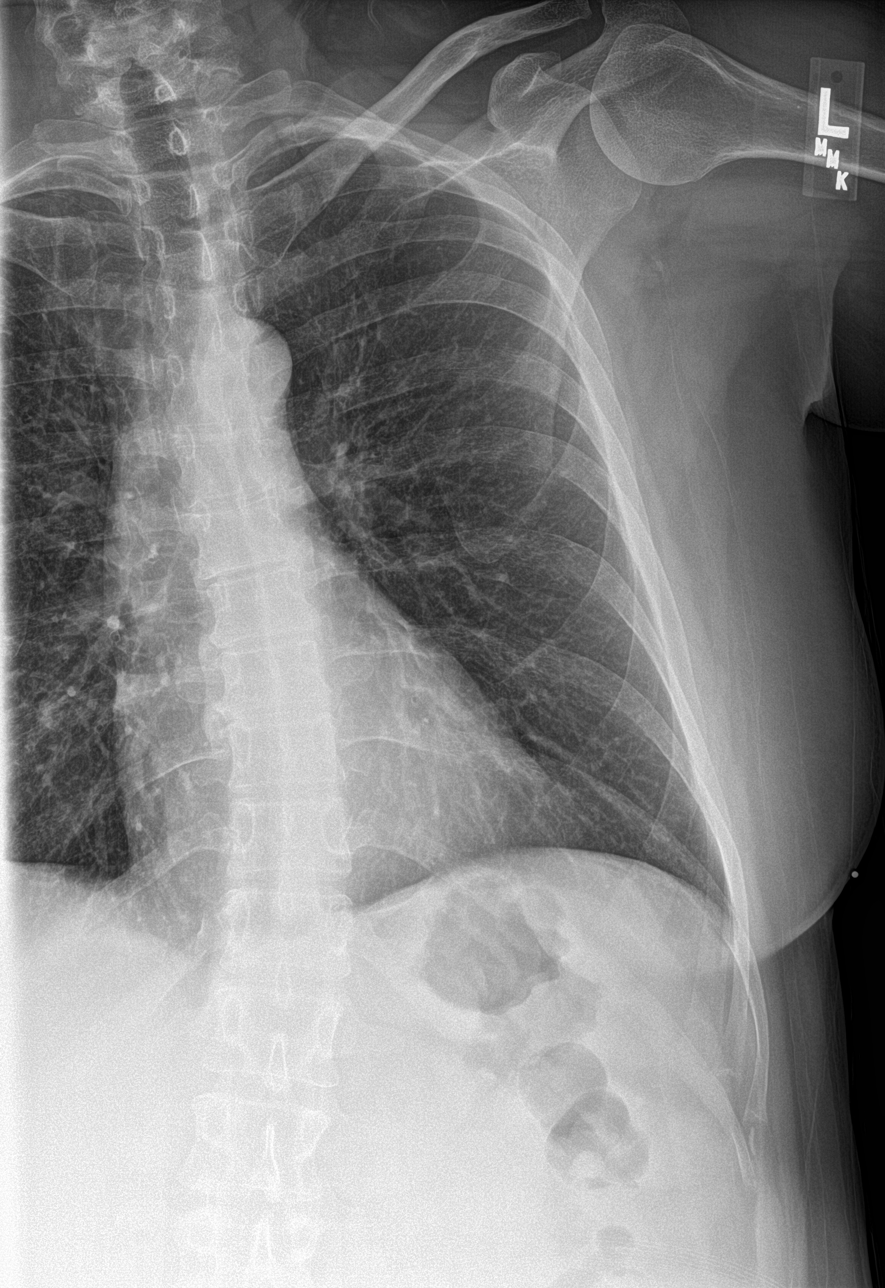

[rib ap (2 of 2)]
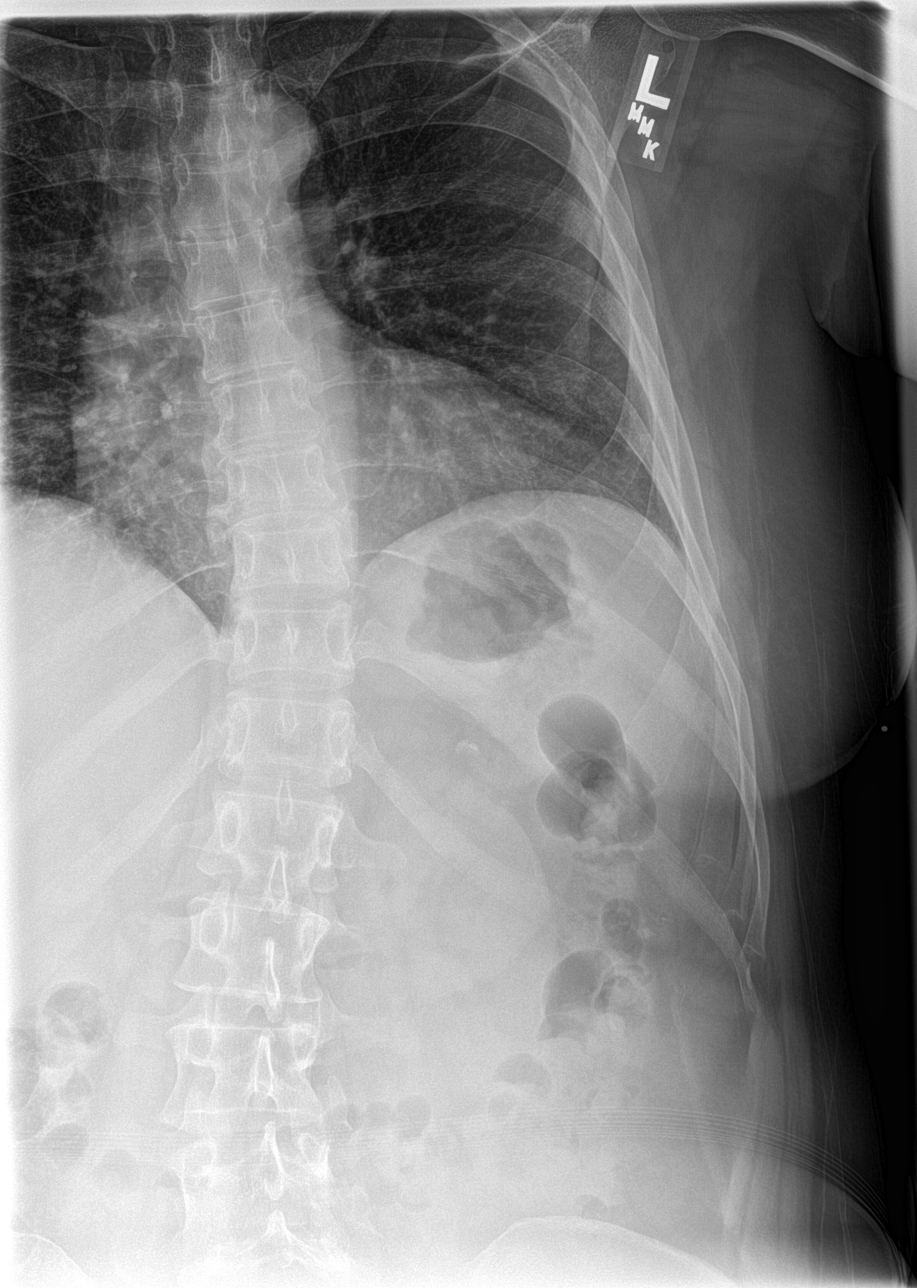

[rib pa obl]
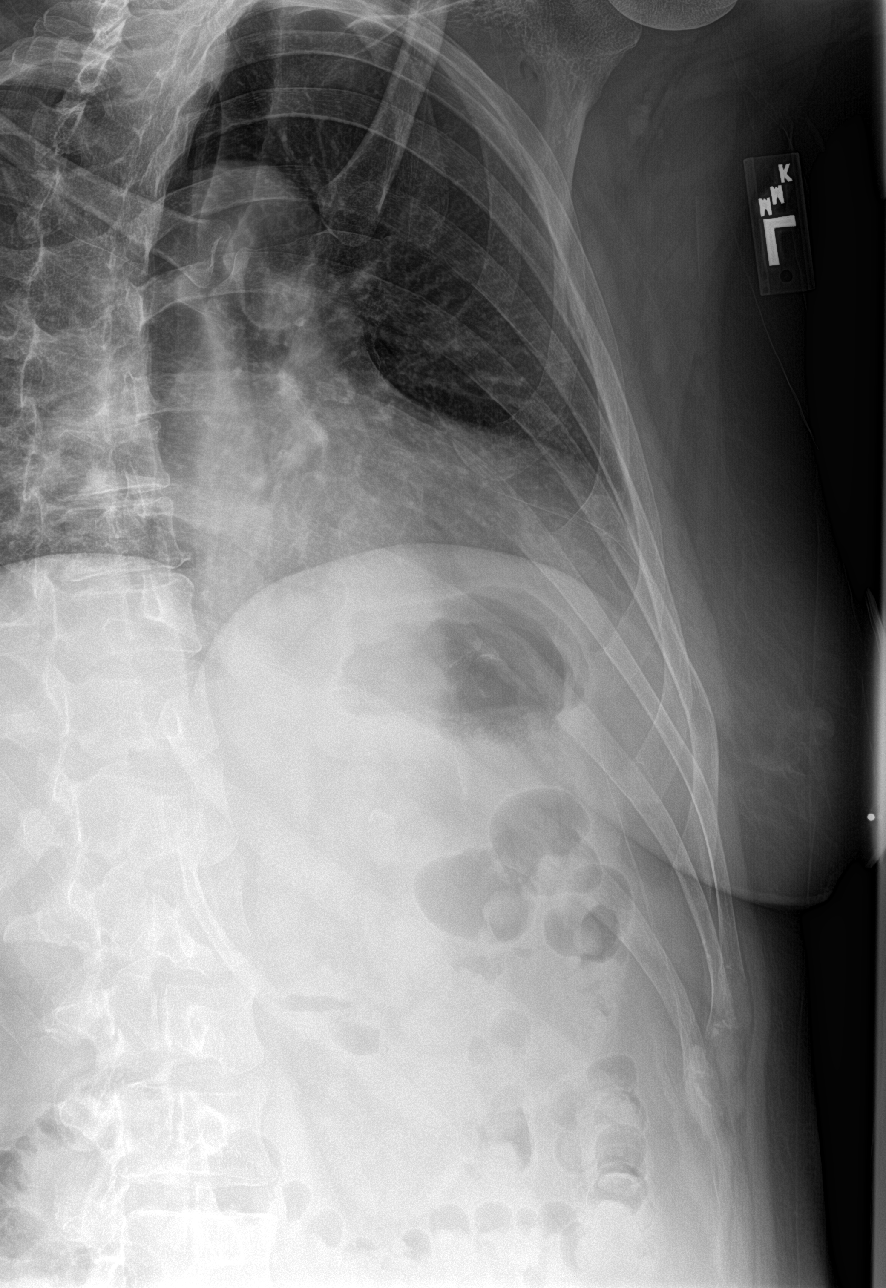

[4 of 4 positions shown; findings below may reference images not displayed]

FINDINGS: Frontal chest as well as oblique and cone-down lateral images were
obtained. Lungs are clear. Heart size and pulmonary vascularity are
normal. No adenopathy.

No effusion or pneumothorax.  No demonstrable rib fracture.
IMPRESSION: No demonstrable rib fracture.  Lungs clear.

## 2014-03-07 MED ORDER — HYDROCODONE-HOMATROPINE 5-1.5 MG/5ML PO SYRP: 5.0000 mL | ORAL_SOLUTION | Freq: Four times a day (QID) | ORAL | Status: DC | PRN

## 2014-03-07 MED ORDER — IPRATROPIUM BROMIDE 0.02 % IN SOLN
0.5000 mg | Freq: Once | RESPIRATORY_TRACT | Status: AC
  Administered 2014-03-07: 0.5 mg via RESPIRATORY_TRACT
  Filled 2014-03-07: qty 2.5

## 2014-03-07 MED ORDER — PREDNISONE 20 MG PO TABS
60.0000 mg | ORAL_TABLET | Freq: Once | ORAL | Status: AC
  Administered 2014-03-07: 60 mg via ORAL
  Filled 2014-03-07: qty 3

## 2014-03-07 MED ORDER — ALBUTEROL SULFATE (2.5 MG/3ML) 0.083% IN NEBU
5.0000 mg | INHALATION_SOLUTION | Freq: Once | RESPIRATORY_TRACT | Status: AC
  Administered 2014-03-07: 5 mg via RESPIRATORY_TRACT
  Filled 2014-03-07: qty 6

## 2014-03-07 MED ORDER — PREDNISONE 20 MG PO TABS: 60.0000 mg | ORAL_TABLET | Freq: Every day | ORAL | Status: DC

## 2014-03-07 NOTE — ED Notes (Signed)
Pt made aware to return if symptoms worsen or if any life threatening symptoms occur.   

## 2014-03-07 NOTE — ED Notes (Signed)
Pt reports dry cough x1.5 week with wheezing. Pt has left sided cp radiating to back.   Pt also notes chills and fever.  Pt alert and oriented.

## 2014-03-07 NOTE — ED Notes (Signed)
Per pt sts URI symptoms with severe cough. sts sharp pains with coughing on the left side.

## 2014-03-07 NOTE — Discharge Instructions (Signed)
Followup with your doctor in regards to your asthma exacerbation. Read the instructions below to learn more about factors that can trigger asthma. Use her albuterol inhaler as directed. Your more than welcome to return to the emergency department if you become short of breath, your albuterol inhaler did not relieve symptoms, you are having chest pain associated with difficulty breathing, or any symptoms that are concerning to you.    IDENTIFY AND CONTROL FACTORS THAT MAKE YOUR ASTHMA WORSE A number of common things can set off or make your asthma symptoms worse (asthma triggers). Keep track of your asthma symptoms for several weeks, detailing all the environmental and emotional factors that are linked with your asthma. When you have an asthma attack, go back to your asthma diary to see which factor, or combination of factors, might have contributed to it. Once you know what these factors are, you can take steps to control many of them.  Allergies: If you have allergies and asthma, it is important to take asthma prevention steps at home. Asthma attacks (worsening of asthma symptoms) can be triggered by allergies, which can cause temporary increased inflammation of your airways. Minimizing contact with the substance to which you are allergic will help prevent an asthma attack. Animal Dander:   Some people are allergic to the flakes of skin or dried saliva from animals with fur or feathers. Keep these pets out of your home.   If you can't keep a pet outdoors, keep the pet out of your bedroom and other sleeping areas at all times, and keep the door closed.   Remove carpets and furniture covered with cloth from your home. If that is not possible, keep the pet away from fabric-covered furniture and carpets.  Dust Mites:  Many people with asthma are allergic to dust mites. Dust mites are tiny bugs that are found in every home, in mattresses, pillows, carpets, fabric-covered furniture, bedcovers, clothes,  stuffed toys, fabric, and other fabric-covered items.   Cover your mattress in a special dust-proof cover.   Cover your pillow in a special dust-proof cover, or wash the pillow each week in hot water. Water must be hotter than 130 F to kill dust mites. Cold or warm water used with detergent and bleach can also be effective.   Wash the sheets and blankets on your bed each week in hot water.   Try not to sleep or lie on cloth-covered cushions.   Call ahead when traveling and ask for a smoke-free hotel room. Bring your own bedding and pillows, in case the hotel only supplies feather pillows and down comforters, which may contain dust mites and cause asthma symptoms.   Remove carpets from your bedroom and those laid on concrete, if you can.   Keep stuffed toys out of the bed, or wash the toys weekly in hot water or cooler water with detergent and bleach.  Cockroaches:  Many people with asthma are allergic to the droppings and remains of cockroaches.   Keep food and garbage in closed containers. Never leave food out.   Use poison baits, traps, powders, gels, or paste (for example, boric acid).   If a spray is used to kill cockroaches, stay out of the room until the odor goes away.  Indoor Mold:  Fix leaky faucets, pipes, or other sources of water that have mold around them.   Clean moldy surfaces with a cleaner that has bleach in it.  Pollen and Outdoor Mold:  When pollen or mold spore counts  are high, try to keep your windows closed.   Stay indoors with windows closed from late morning to afternoon, if you can. Pollen and some mold spore counts are highest at that time.   Ask your caregiver whether you need to take or increase anti-inflammatory medicine before your allergy season starts.  Irritants:   Tobacco smoke is an irritant. If you smoke, ask your caregiver how you can quit. Ask family members to quit smoking, too. Do not allow smoking in your home or car.   If possible, do  not use a wood-burning stove, kerosene heater, or fireplace. Minimize exposure to all sources of smoke, including incense, candles, fires, and fireworks.   Try to stay away from strong odors and sprays, such as perfume, talcum powder, hair spray, and paints.   Decrease humidity in your home and use an indoor air cleaning device. Reduce indoor humidity to below 60 percent. Dehumidifiers or central air conditioners can do this.   Try to have someone else vacuum for you once or twice a week, if you can. Stay out of rooms while they are being vacuumed and for a short while afterward.   If you vacuum, use a dust mask from a hardware store, a double-layered or microfilter vacuum cleaner bag, or a vacuum cleaner with a HEPA filter.   Sulfites in foods and beverages can be irritants. Do not drink beer or wine, or eat dried fruit, processed potatoes, or shrimp if they cause asthma symptoms.   Cold air can trigger an asthma attack. Cover your nose and mouth with a scarf on cold or windy days.   Several health conditions can make asthma more difficult to manage, including runny nose, sinus infections, reflux disease, psychological stress, and sleep apnea. Your caregiver will treat these conditions, as well.   Avoid close contact with people who have a cold or the flu, since your asthma symptoms may get worse if you catch the infection from them. Wash your hands thoroughly after touching items that may have been handled by people with a respiratory infection.   Get a flu shot every year to protect against the flu virus, which often makes asthma worse for days or weeks. Also get a pneumonia shot once every five to 10 years.  Drugs:  Aspirin and other painkillers can cause asthma attacks. 10% to 20% of people with asthma have sensitivity to aspirin or a group of painkillers called non-steroidal anti-inflammatory drugs (NSAIDS), such as ibuprofen and naproxen. These drugs are used to treat pain and reduce  fevers. Asthma attacks caused by any of these medicines can be severe and even fatal. These drugs must be avoided in people who have known aspirin sensitive asthma. Products with acetaminophen are considered safe for people who have asthma. It is important that people with aspirin sensitivity read labels of all over-the-counter drugs used to treat pain, colds, coughs, and fever.   Beta blockers and ACE inhibitors are other drugs which you should discuss with your caregiver, in relation to your asthma.   EXERCISE  If you have exercise-induced asthma, or are planning vigorous exercise, or exercise in cold, humid, or dry environments, prevent exercise-induced asthma by following your caregiver's advice regarding asthma treatment before exercising. D

## 2014-03-07 NOTE — ED Provider Notes (Signed)
CSN: [id number]     Arrival date & time 03/07/14  1429 History   First MD Initiated Contact with Patient 03/07/14 1616     Chief Complaint  Patient presents with  . Cough  . URI     (Consider location/radiation/quality/duration/timing/severity/associated sxs/prior Treatment) HPI Comments: Patient with a history of Asthma presents today with a chief complaint of cough.  She reports that the cough has been present for the past 8 days and gradually worsening.  She reports associated wheezing and SOB.  She reports that she had sinus pressure and congestion two weeks ago, which has now improved.  She has been taking Mucinex, Robitussin, and Theraflu for symptoms without improvement.  She has also been using her Albuterol without improvement.  She reports pain of her chest, but only with coughing.  Pain across chest and does not radiate.  Pain has been intermittent over the past 5 days.  Pain only associated with coughing.  No association with exertion.  No chest pain at this time.  She denies fever, chills, nausea, vomiting, dizziness, lightheadedness, or diaphoresis.   She states that she smokes 7 cigarettes a day.    Patient is a 54 y.o. female presenting with cough and URI. The history is provided by the patient.  Cough URI Presenting symptoms: cough     Past Medical History  Diagnosis Date  . Asthma   . Hypothyroidism    Past Surgical History  Procedure Laterality Date  . Tonsillectomy    . Endometrosis    . Abdominal hysterectomy    . Appendectomy    . Tubal ligation    . Knee surgery     Family History  Problem Relation Age of Onset  . Diabetes Father   . Heart disease Father   . Hypertension Father   . Cancer Paternal Grandmother     breast cancer   History  Substance Use Topics  . Smoking status: Current Every Day Smoker -- 0.50 packs/day    Types: Cigarettes    Last Attempt to Quit: 12/09/2010  . Smokeless tobacco: Not on file  . Alcohol Use: Yes   OB History    Gravida Para Term Preterm AB TAB SAB Ectopic Multiple Living   2 2 2  0 0 0 0 0       Review of Systems  Respiratory: Positive for cough.   All other systems reviewed and are negative.     Allergies  Codeine and Codeine  Home Medications   Prior to Admission medications   Medication Sig Start Date End Date Taking? Authorizing Provider  acetaminophen (TYLENOL) 500 MG tablet Take 1,000 mg by mouth every 8 (eight) hours as needed for mild pain, moderate pain or headache.    Historical Provider, MD  albuterol (PROVENTIL HFA;VENTOLIN HFA) 108 (90 BASE) MCG/ACT inhaler Inhale 1-2 puffs into the lungs every 6 (six) hours as needed for wheezing. 06/16/12   Fransico Meadow, PA-C  albuterol-ipratropium (COMBIVENT) [phone] MCG/ACT inhaler Inhale 2 puffs into the lungs every 4 (four) hours as needed for wheezing or shortness of breath.     Historical Provider, MD  atorvastatin (LIPITOR) 20 MG tablet Take 20 mg by mouth daily.    Historical Provider, MD  ciprofloxacin (CIPRO) 500 MG tablet Take 1 tablet (500 mg total) by mouth every 12 (twelve) hours. 01/08/14   Virginia Rochester, NP  diphenhydrAMINE (BENADRYL) 25 MG tablet Take 25 mg by mouth every 8 (eight) hours as needed for allergies.  Historical Provider, MD  escitalopram (LEXAPRO) 10 MG tablet Take 10 mg by mouth daily.    Historical Provider, MD  HYDROcodone-acetaminophen (NORCO/VICODIN) 5-325 MG per tablet Take 1 tablet by mouth every 4 (four) hours as needed for pain. 07/31/12   Jennifer L Piepenbrink, PA-C  ibuprofen (ADVIL,MOTRIN) 200 MG tablet Take 600 mg by mouth every 6 (six) hours as needed for pain.    Historical Provider, MD  levothyroxine (SYNTHROID, LEVOTHROID) 75 MCG tablet Take 75 mcg by mouth daily before breakfast.    Historical Provider, MD  ondansetron (ZOFRAN) 4 MG tablet Take 1 tablet (4 mg total) by mouth every 6 (six) hours. 07/31/12   Jennifer L Piepenbrink, PA-C  phenazopyridine (PYRIDIUM) 200 MG tablet Take 1 tablet (200  mg total) by mouth 3 (three) times daily. 01/08/14   Virginia Rochester, NP  polyethylene glycol powder (GLYCOLAX/MIRALAX) powder Take 17 g by mouth daily. Until daily soft stools  OTC 07/31/12   Jennifer L Piepenbrink, PA-C  ranitidine (ZANTAC) 75 MG tablet Take 75 mg by mouth daily as needed for heartburn.    Historical Provider, MD  zolpidem (AMBIEN) 10 MG tablet Take 10 mg by mouth at bedtime as needed for sleep.    Historical Provider, MD   BP 116/68 mmHg  Pulse 80  Temp(Src) 98.1 F (36.7 C)  Resp 18  SpO2 91% Physical Exam  Constitutional: She appears well-developed and well-nourished.  HENT:  Head: Normocephalic and atraumatic.  Right Ear: Tympanic membrane and ear canal normal.  Left Ear: Tympanic membrane and ear canal normal.  Nose: Nose normal. Right sinus exhibits no maxillary sinus tenderness and no frontal sinus tenderness. Left sinus exhibits no maxillary sinus tenderness and no frontal sinus tenderness.  Mouth/Throat: Uvula is midline, oropharynx is clear and moist and mucous membranes are normal.  Neck: Normal range of motion. Neck supple.  Cardiovascular: Normal rate, regular rhythm and normal heart sounds.   Pulmonary/Chest: Effort normal. She has decreased breath sounds. She has wheezes. She exhibits tenderness.  Diffuse expiratory wheezing Patient speaking in complete sentences.  No signs of respiratory distress.  Abdominal: Soft. Bowel sounds are normal.  Musculoskeletal: Normal range of motion.  Neurological: She is alert.  Skin: Skin is warm and dry.  Psychiatric: She has a normal mood and affect.  Nursing note and vitals reviewed.   ED Course  Procedures (including critical care time) Labs Review Labs Reviewed - No data to display  Imaging Review Dg Ribs Unilateral W/chest Left  03/07/2014   CLINICAL DATA:  Left axillary pain for 3 days. Cough for past 2 weeks  EXAM: LEFT RIBS AND CHEST - 3+ VIEW  COMPARISON:  Chest radiograph July 31, 2012  FINDINGS:  Frontal chest as well as oblique and cone-down lateral images were obtained. Lungs are clear. Heart size and pulmonary vascularity are normal. No adenopathy.  No effusion or pneumothorax.  No demonstrable rib fracture.  IMPRESSION: No demonstrable rib fracture.  Lungs clear.   Electronically Signed   By: Lowella Grip M.D.   On: 03/07/2014 15:49     EKG Interpretation None      6:15 PM Reassessed patient after first breathing treatment.  She reports improvement in symptoms.  Patient continues to have mild expiratory wheezing.  Will order another breathing treatment and reassess.  7:43 PM Pulse ox 98-100 on RA while ambulating MDM   Final diagnoses:  Cough   Patient with a history of Asthma presents today with cough, wheezing, and SOB.  Symptoms present for the past 8 days and gradually worsening.  On exam, patient with diffuse expiratory wheezing.  Patient given two breathing treatments and Prednisone with improvement of symptoms.  Patient able to ambulate in the ED with oxygen sats at 98-100.  CXR negative.  No signs of respiratory distress.  Symptoms most consistent with Viral URI and Asthma Exacerbation.  Patient discharged home with 5 day course of Prednisone and instructed to continue using her Albuterol.  Patient stable for discharge.  Return precautions given.      Hyman Bible, PA-C 03/07/14 2229  Charlesetta Shanks, MD 03/08/14 2236

## 2014-03-07 NOTE — ED Notes (Addendum)
Ambulated pt, sats 98.  MD notified.

## 2014-12-01 ENCOUNTER — Emergency Department (INDEPENDENT_AMBULATORY_CARE_PROVIDER_SITE_OTHER)
Admission: EM | Admit: 2014-12-01 | Discharge: 2014-12-01 | Disposition: A | Discharge status: Home / Self Care | Payer: Self-pay | Source: Home / Self Care | Attending: Emergency Medicine | Admitting: Emergency Medicine

## 2014-12-01 ENCOUNTER — Encounter (HOSPITAL_COMMUNITY): Payer: Self-pay | Admitting: Emergency Medicine

## 2014-12-01 DIAGNOSIS — J9801 Acute bronchospasm: Secondary | ICD-10-CM

## 2014-12-01 DIAGNOSIS — J4531 Mild persistent asthma with (acute) exacerbation: Secondary | ICD-10-CM

## 2014-12-01 DIAGNOSIS — Z720 Tobacco use: Secondary | ICD-10-CM

## 2014-12-01 DIAGNOSIS — J069 Acute upper respiratory infection, unspecified: Secondary | ICD-10-CM

## 2014-12-01 MED ORDER — IPRATROPIUM-ALBUTEROL 0.5-2.5 (3) MG/3ML IN SOLN
RESPIRATORY_TRACT | Status: AC
  Filled 2014-12-01: qty 3

## 2014-12-01 MED ORDER — IPRATROPIUM BROMIDE 0.06 % NA SOLN: 2.0000 | Freq: Four times a day (QID) | NASAL | Status: DC

## 2014-12-01 MED ORDER — PREDNISONE 20 MG PO TABS: ORAL_TABLET | ORAL | Status: DC

## 2014-12-01 MED ORDER — ALBUTEROL SULFATE (2.5 MG/3ML) 0.083% IN NEBU
2.5000 mg | INHALATION_SOLUTION | Freq: Once | RESPIRATORY_TRACT | Status: AC
  Administered 2014-12-01: 2.5 mg via RESPIRATORY_TRACT

## 2014-12-01 MED ORDER — ALBUTEROL SULFATE (2.5 MG/3ML) 0.083% IN NEBU
INHALATION_SOLUTION | RESPIRATORY_TRACT | Status: AC
  Filled 2014-12-01: qty 3

## 2014-12-01 MED ORDER — IPRATROPIUM-ALBUTEROL 0.5-2.5 (3) MG/3ML IN SOLN
3.0000 mL | Freq: Once | RESPIRATORY_TRACT | Status: AC
  Administered 2014-12-01: 3 mL via RESPIRATORY_TRACT

## 2014-12-01 NOTE — ED Provider Notes (Signed)
CSN: [id number]     Arrival date & time 12/01/14  1453 History   First MD Initiated Contact with Patient 12/01/14 1608     No chief complaint on file.  (Consider location/radiation/quality/duration/timing/severity/associated sxs/prior Treatment) HPI Comments: 54 year old female with a history of asthma and who smokes now down to 6 cigarettes a day is complaining of a 3-4 day history of sore throat, decreased energy, anterior chest pain described as heaviness, particularly with breathing, cough that is worse when supine, PND and runny nose. Denies any documented fevers. She  subjectively feels cold and then hot. Patient is a 54 y.o. female presenting with cough.  Cough Cough characteristics:  Harsh, paroxysmal, non-productive and hacking Severity:  Moderate Onset quality:  Sudden Duration:  2 weeks Timing:  Constant Progression:  Worsening Chronicity:  New Context: smoke exposure and upper respiratory infection   Relieved by:  None tried Associated symptoms: chest pain, rhinorrhea, shortness of breath, sinus congestion and sore throat   Associated symptoms: no chills, no fever and no rash     Past Medical History  Diagnosis Date  . Asthma   . Hypothyroidism    Past Surgical History  Procedure Laterality Date  . Tonsillectomy    . Endometrosis    . Abdominal hysterectomy    . Appendectomy    . Tubal ligation    . Knee surgery     Family History  Problem Relation Age of Onset  . Diabetes Father   . Heart disease Father   . Hypertension Father   . Cancer Paternal Grandmother     breast cancer   Social History  Substance Use Topics  . Smoking status: Current Every Day Smoker -- 0.50 packs/day    Types: Cigarettes    Last Attempt to Quit: 12/09/2010  . Smokeless tobacco: None  . Alcohol Use: Yes   OB History    Gravida Para Term Preterm AB TAB SAB Ectopic Multiple Living   2 2 2  0 0 0 0 0       Review of Systems  Constitutional: Positive for activity change and  fatigue. Negative for fever, chills and appetite change.  HENT: Positive for congestion, postnasal drip, rhinorrhea and sore throat. Negative for ear discharge and facial swelling.   Eyes: Negative.   Respiratory: Positive for cough and shortness of breath.   Cardiovascular: Positive for chest pain. Negative for palpitations and leg swelling.  Gastrointestinal: Negative.   Genitourinary: Negative.   Musculoskeletal: Negative.  Negative for neck pain and neck stiffness.  Skin: Negative for pallor and rash.  Neurological: Negative.   All other systems reviewed and are negative.   Allergies  Codeine and Codeine  Home Medications   Prior to Admission medications   Medication Sig Start Date End Date Taking? Authorizing Provider  acetaminophen (TYLENOL) 500 MG tablet Take 1,000 mg by mouth every 8 (eight) hours as needed for mild pain, moderate pain or headache.    Historical Provider, MD  albuterol (PROVENTIL HFA;VENTOLIN HFA) 108 (90 BASE) MCG/ACT inhaler Inhale 1-2 puffs into the lungs every 6 (six) hours as needed for wheezing. 06/16/12   Fransico Meadow, PA-C  albuterol-ipratropium (COMBIVENT) [phone] MCG/ACT inhaler Inhale 2 puffs into the lungs every 4 (four) hours as needed for wheezing or shortness of breath.     Historical Provider, MD  atorvastatin (LIPITOR) 20 MG tablet Take 20 mg by mouth daily.    Historical Provider, MD  ciprofloxacin (CIPRO) 500 MG tablet Take 1 tablet (500 mg total)  by mouth every 12 (twelve) hours. 01/08/14   Virginia Rochester, NP  diphenhydrAMINE (BENADRYL) 25 MG tablet Take 25 mg by mouth every 8 (eight) hours as needed for allergies.    Historical Provider, MD  escitalopram (LEXAPRO) 10 MG tablet Take 10 mg by mouth daily.    Historical Provider, MD  HYDROcodone-acetaminophen (NORCO/VICODIN) 5-325 MG per tablet Take 1 tablet by mouth every 4 (four) hours as needed for pain. 07/31/12   Jennifer Piepenbrink, PA-C  HYDROcodone-homatropine (HYCODAN) 5-1.5 MG/5ML  syrup Take 5 mLs by mouth every 6 (six) hours as needed for cough. 03/07/14   Heather Laisure, PA-C  ibuprofen (ADVIL,MOTRIN) 200 MG tablet Take 600 mg by mouth every 6 (six) hours as needed for pain.    Historical Provider, MD  ipratropium (ATROVENT) 0.06 % nasal spray Place 2 sprays into both nostrils 4 (four) times daily. 12/01/14   Janne Napoleon, NP  levothyroxine (SYNTHROID, LEVOTHROID) 75 MCG tablet Take 75 mcg by mouth daily before breakfast.    Historical Provider, MD  ondansetron (ZOFRAN) 4 MG tablet Take 1 tablet (4 mg total) by mouth every 6 (six) hours. 07/31/12   Jennifer Piepenbrink, PA-C  phenazopyridine (PYRIDIUM) 200 MG tablet Take 1 tablet (200 mg total) by mouth 3 (three) times daily. 01/08/14   Virginia Rochester, NP  polyethylene glycol powder (GLYCOLAX/MIRALAX) powder Take 17 g by mouth daily. Until daily soft stools  OTC 07/31/12   Baron Sane, PA-C  predniSONE (DELTASONE) 20 MG tablet 3 Tabs PO Days 1-3, then 2 tabs PO Days 4-6, then 1 tab PO Day 7-9, then Half Tab PO Day 10-12 12/01/14   Janne Napoleon, NP  ranitidine (ZANTAC) 75 MG tablet Take 75 mg by mouth daily as needed for heartburn.    Historical Provider, MD  zolpidem (AMBIEN) 10 MG tablet Take 10 mg by mouth at bedtime as needed for sleep.    Historical Provider, MD   Meds Ordered and Administered this Visit   Medications  ipratropium-albuterol (DUONEB) 0.5-2.5 (3) MG/3ML nebulizer solution 3 mL (3 mLs Nebulization Given 12/01/14 1705)  albuterol (PROVENTIL) (2.5 MG/3ML) 0.083% nebulizer solution 2.5 mg (2.5 mg Nebulization Given 12/01/14 1705)    BP 127/84 mmHg  Pulse 70  Temp(Src) 97.8 F (36.6 C) (Oral)  Resp 16  SpO2 99% No data found.   Physical Exam  Constitutional: She is oriented to person, place, and time. She appears well-developed and well-nourished. No distress.  HENT:  Head: Normocephalic and atraumatic.  Mouth/Throat: No oropharyngeal exudate.  Bilateral TMs are clear, normal but mildly  retracted. Oropharynx with minor erythema and clear PND.  Eyes: Conjunctivae and EOM are normal.  Neck: Normal range of motion. Neck supple.  Cardiovascular: Normal rate, regular rhythm and normal heart sounds.   Pulmonary/Chest: No respiratory distress.  Increased respiratory effort with expirations. Expiratory phase prolonged. Bilateral wheezing. No crackles.  Musculoskeletal: Normal range of motion. She exhibits no edema.  Lymphadenopathy:    She has no cervical adenopathy.  Neurological: She is alert and oriented to person, place, and time.  Skin: Skin is warm and dry. No rash noted.  Psychiatric: She has a normal mood and affect.  Nursing note and vitals reviewed.   ED Course  Procedures (including critical care time)  Labs Review Labs Reviewed - No data to display  Imaging Review No results found.   Visual Acuity Review  Right Eye Distance:   Left Eye Distance:   Bilateral Distance:    Right Eye Near:  Left Eye Near:    Bilateral Near:         MDM   1. Asthma exacerbation attacks, mild persistent   2. URI (upper respiratory infection)   3. Cough due to bronchospasm   4. Tobacco abuse disorder    Reducing the amount of inflammation and bronchospasm in your airways should decrease your cough substantially. Reducing the drainage will also help. Recommend taking Chlor-Trimeton 2-4 mg at nighttime before bed. During the day may take Allegra 180 mg or Zyrtec 10 mg a day. For congestion Sudafed PE 10 mg every 4H as needed. Stop smoking Use your albuterol HFA 2 puffs every 4 hours when necessary. Atrovent nasal spray as directed The patient was given an  5 mg/2.5 DuoNeb 5. She states she is breathing better after the neb. Her lungs are clear. Much improved air movement and diminished wheezing and cough.    Janne Napoleon, NP 12/01/14 [phone]

## 2014-12-01 NOTE — Discharge Instructions (Signed)
Asthma Attack Prevention °While you may not be able to control the fact that you have asthma, you can take actions to prevent asthma attacks. The best way to prevent asthma attacks is to maintain good control of your asthma. You can achieve this by: °· Taking your medicines as directed. °· Avoiding things that can irritate your airways or make your asthma symptoms worse (asthma triggers). °· Keeping track of how well your asthma is controlled and of any changes in your symptoms. °· Responding quickly to worsening asthma symptoms (asthma attack). °· Seeking emergency care when it is needed. °WHAT ARE SOME WAYS TO PREVENT AN ASTHMA ATTACK? °Have a Plan °Work with your health care provider to create a written plan for managing and treating your asthma attacks (asthma action plan). This plan includes: °· A list of your asthma triggers and how you can avoid them. °· Information on when medicines should be taken and when their dosages should be changed. °· The use of a device that measures how well your lungs are working (peak flow meter). °Monitor Your Asthma °Use your peak flow meter and record your results in a journal every day. A drop in your peak flow numbers on one or more days may indicate the start of an asthma attack. This can happen even before you start to feel symptoms. You can prevent an asthma attack from getting worse by following the steps in your asthma action plan. °Avoid Asthma Triggers °Work with your asthma health care provider to find out what your asthma triggers are. This can be done by: °· Allergy testing. °· Keeping a journal that notes when asthma attacks occur and the factors that may have contributed to them. °· Determining if there are other medical conditions that are making your asthma worse. °Once you have determined your asthma triggers, take steps to avoid them. This may include avoiding excessive or prolonged exposure to: °· Dust. Have someone dust and vacuum your home for you once or  twice a week. Using a high-efficiency particulate arrestance (HEPA) vacuum is best. °· Smoke. This includes campfire smoke, forest fire smoke, and secondhand smoke from tobacco products. °· Pet dander. Avoid contact with animals that you know you are allergic to. °· Allergens from trees, grasses or pollens. Avoid spending a lot of time outdoors when pollen counts are high, and on very windy days. °· Very cold, dry, or humid air. °· Mold. °· Foods that contain high amounts of sulfites. °· Strong odors. °· Outdoor air pollutants, such as engine exhaust. °· Indoor air pollutants, such as aerosol sprays and fumes from household cleaners. °· Household pests, including dust mites and cockroaches, and pest droppings. °· Certain medicines, including NSAIDs. Always talk to your health care provider before stopping or starting any new medicines. °Medicines °Take over-the-counter and prescription medicines only as told by your health care provider. Many asthma attacks can be prevented by carefully following your medicine schedule. Taking your medicines correctly is especially important when you cannot avoid certain asthma triggers. °Act Quickly °If an asthma attack does happen, acting quickly can decrease how severe it is and how long it lasts. Take these steps:  °· Pay attention to your symptoms. If you are coughing, wheezing, or having difficulty breathing, do not wait to see if your symptoms go away on their own. Follow your asthma action plan. °· If you have followed your asthma action plan and your symptoms are not improving, call your health care provider or seek immediate medical care   at the nearest hospital. It is important to note how often you need to use your fast-acting rescue inhaler. If you are using your rescue inhaler more often, it may mean that your asthma is not under control. Adjusting your asthma treatment plan may help you to prevent future asthma attacks and help you to gain better control of your  condition. HOW CAN I PREVENT AN ASTHMA ATTACK WHEN I EXERCISE? Follow advice from your health care provider about whether you should use your fast-acting inhaler before exercising. Many people with asthma experience exercise-induced bronchoconstriction (EIB). This condition often worsens during vigorous exercise in cold, humid, or dry environments. Usually, people with EIB can stay very active by pre-treating with a fast-acting inhaler before exercising.   This information is not intended to replace advice given to you by your health care provider. Make sure you discuss any questions you have with your health care provider.   Document Released: 01/23/2009 Document Revised: 10/26/2014 Document Reviewed: 07/07/2014 Elsevier Interactive Patient Education 2016 [employer]  Bronchospasm, Adult A bronchospasm is a spasm or tightening of the airways going into the lungs. During a bronchospasm breathing becomes more difficult because the airways get smaller. When this happens there can be coughing, a whistling sound when breathing (wheezing), and difficulty breathing. Bronchospasm is often associated with asthma, but not all patients who experience a bronchospasm have asthma. CAUSES  A bronchospasm is caused by inflammation or irritation of the airways. The inflammation or irritation may be triggered by:   Allergies (such as to animals, pollen, food, or mold). Allergens that cause bronchospasm may cause wheezing immediately after exposure or many hours later.   Infection. Viral infections are believed to be the most common cause of bronchospasm.   Exercise.   Irritants (such as pollution, cigarette smoke, strong odors, aerosol sprays, and paint fumes).   Weather changes. Winds increase molds and pollens in the air. Rain refreshes the air by washing irritants out. Cold air may cause inflammation.   Stress and emotional upset.  SIGNS AND SYMPTOMS   Wheezing.   Excessive nighttime  coughing.   Frequent or severe coughing with a simple cold.   Chest tightness.   Shortness of breath.  DIAGNOSIS  Bronchospasm is usually diagnosed through a history and physical exam. Tests, such as chest X-rays, are sometimes done to look for other conditions. TREATMENT   Inhaled medicines can be given to open up your airways and help you breathe. The medicines can be given using either an inhaler or a nebulizer machine.  Corticosteroid medicines may be given for severe bronchospasm, usually when it is associated with asthma. HOME CARE INSTRUCTIONS   Always have a plan prepared for seeking medical care. Know when to call your health care provider and local emergency services (911 in the [place] Know where you can access local emergency care.  Only take medicines as directed by your health care provider.  If you were prescribed an inhaler or nebulizer machine, ask your health care provider to explain how to use it correctly. Always use a spacer with your inhaler if you were given one.  It is necessary to remain calm during an attack. Try to relax and breathe more slowly.  Control your home environment in the following ways:   Change your heating and air conditioning filter at least once a month.   Limit your use of fireplaces and wood stoves.  Do not smoke and do not allow smoking in your home.   Avoid exposure to  perfumes and fragrances.   Get rid of pests (such as roaches and mice) and their droppings.   Throw away plants if you see mold on them.   Keep your house clean and dust free.   Replace carpet with wood, tile, or vinyl flooring. Carpet can trap dander and dust.   Use allergy-proof pillows, mattress covers, and box spring covers.   Wash bed sheets and blankets every week in hot water and dry them in a dryer.   Use blankets that are made of polyester or cotton.   Wash hands frequently. SEEK MEDICAL CARE IF:   You have muscle aches.   You  have chest pain.   The sputum changes from clear or white to yellow, green, gray, or bloody.   The sputum you cough up gets thicker.   There are problems that may be related to the medicine you are given, such as a rash, itching, swelling, or trouble breathing.  SEEK IMMEDIATE MEDICAL CARE IF:   You have worsening wheezing and coughing even after taking your prescribed medicines.   You have increased difficulty breathing.   You develop severe chest pain. MAKE SURE YOU:   Understand these instructions.  Will watch your condition.  Will get help right away if you are not doing well or get worse.   This information is not intended to replace advice given to you by your health care provider. Make sure you discuss any questions you have with your health care provider.   Document Released: 02/07/2003 Document Revised: 02/25/2014 Document Reviewed: 07/27/2012 Elsevier Interactive Patient Education 2016 [employer].  Smoking Hazards Smoking cigarettes is extremely bad for your health. Tobacco smoke has over 200 known poisons in it. It contains the poisonous gases nitrogen oxide and carbon monoxide. There are over 60 chemicals in tobacco smoke that cause cancer. Some of the chemicals found in cigarette smoke include:   Cyanide.   Benzene.   Formaldehyde.   Methanol (wood alcohol).   Acetylene (fuel used in welding torches).   Ammonia.  Even smoking lightly shortens your life expectancy by several years. You can greatly reduce the risk of medical problems for you and your family by stopping now. Smoking is the most preventable cause of death and disease in our society. Within days of quitting smoking, your circulation improves, you decrease the risk of having a heart attack, and your lung capacity improves. There may be some increased phlegm in the first few days after quitting, and it may take months for your lungs to clear up completely. Quitting for 10 years reduces  your risk of developing lung cancer to almost that of a nonsmoker.  WHAT ARE THE RISKS OF SMOKING? Cigarette smokers have an increased risk of many serious medical problems, including:  Lung cancer.   Lung disease (such as pneumonia, bronchitis, and emphysema).   Heart attack and chest pain due to the heart not getting enough oxygen (angina).   Heart disease and peripheral blood vessel disease.   Hypertension.   Stroke.   Oral cancer (cancer of the lip, mouth, or voice box).   Bladder cancer.   Pancreatic cancer.   Cervical cancer.   Pregnancy complications, including premature birth.   Stillbirths and smaller newborn babies, birth defects, and genetic damage to sperm.   Early menopause.   Lower estrogen level for women.   Infertility.   Facial wrinkles.   Blindness.   Increased risk of broken bones (fractures).   Senile dementia.   Stomach ulcers  and internal bleeding.   Delayed wound healing and increased risk of complications during surgery. Because of secondhand smoke exposure, children of smokers have an increased risk of the following:   Sudden infant death syndrome (SIDS).   Respiratory infections.   Lung cancer.   Heart disease.   Ear infections.  WHY IS SMOKING ADDICTIVE? Nicotine is the chemical agent in tobacco that is capable of causing addiction or dependence. When you smoke and inhale, nicotine is absorbed rapidly into the bloodstream through your lungs. Both inhaled and noninhaled nicotine may be addictive.  WHAT ARE THE BENEFITS OF QUITTING?  There are many health benefits to quitting smoking. Some are:   The likelihood of developing cancer and heart disease decreases. Health improvements are seen almost immediately.   Blood pressure, pulse rate, and breathing patterns start returning to normal soon after quitting.   People who quit may see an improvement in their overall quality of life.  HOW DO YOU QUIT  SMOKING? Smoking is an addiction with both physical and psychological effects, and longtime habits can be hard to change. Your health care provider can recommend:  Programs and community resources, which may include group support, education, or therapy.  Replacement products, such as patches, gum, and nasal sprays. Use these products only as directed. Do not replace cigarette smoking with electronic cigarettes (commonly called e-cigarettes). The safety of e-cigarettes is unknown, and some may contain harmful chemicals. FOR MORE INFORMATION  American Lung Association: www.lung.org  American Cancer Society: www.cancer.org   This information is not intended to replace advice given to you by your health care provider. Make sure you discuss any questions you have with your health care provider.   Document Released: 03/14/2004 Document Revised: 11/25/2012 Document Reviewed: 07/27/2012 Elsevier Interactive Patient Education 2016 Else[employer] Inc.   Upper Respiratory Infection, Adult Reducing the amount of inflammation and bronchospasm in your airways should decrease your cough substantially. Reducing the drainage will also help. Recommend taking Chlor-Trimeton 2-4 mg at nighttime before bed. During the day may take Allegra 180 mg or Zyrtec 10 mg a day. For congestion Sudafed PE 10 mg every 4H as needed. Stop smoking   Most upper respiratory infections (URIs) are a viral infection of the air passages leading to the lungs. A URI affects the nose, throat, and upper air passages. The most common type of URI is nasopharyngitis and is typically referred to as "the common cold." URIs run their course and usually go away on their own. Most of the time, a URI does not require medical attention, but sometimes a bacterial infection in the upper airways can follow a viral infection. This is called a secondary infection. Sinus and middle ear infections are common types of secondary upper respiratory  infections. Bacterial pneumonia can also complicate a URI. A URI can worsen asthma and chronic obstructive pulmonary disease (COPD). Sometimes, these complications can require emergency medical care and may be life threatening.  CAUSES Almost all URIs are caused by viruses. A virus is a type of germ and can spread from one person to another.  RISKS FACTORS You may be at risk for a URI if:   You smoke.   You have chronic heart or lung disease.  You have a weakened defense (immune) system.   You are very young or very old.   You have nasal allergies or asthma.  You work in crowded or poorly ventilated areas.  You work in health care facilities or schools. SIGNS AND SYMPTOMS  Symptoms typically  develop 2-3 days after you come in contact with a cold virus. Most viral URIs last 7-10 days. However, viral URIs from the influenza virus (flu virus) can last 14-18 days and are typically more severe. Symptoms may include:   Runny or stuffy (congested) nose.   Sneezing.   Cough.   Sore throat.   Headache.   Fatigue.   Fever.   Loss of appetite.   Pain in your forehead, behind your eyes, and over your cheekbones (sinus pain).  Muscle aches.  DIAGNOSIS  Your health care provider may diagnose a URI by:  Physical exam.  Tests to check that your symptoms are not due to another condition such as:  Strep throat.  Sinusitis.  Pneumonia.  Asthma. TREATMENT  A URI goes away on its own with time. It cannot be cured with medicines, but medicines may be prescribed or recommended to relieve symptoms. Medicines may help:  Reduce your fever.  Reduce your cough.  Relieve nasal congestion. HOME CARE INSTRUCTIONS   Take medicines only as directed by your health care provider.   Gargle warm saltwater or take cough drops to comfort your throat as directed by your health care provider.  Use a warm mist humidifier or inhale steam from a shower to increase air moisture.  This may make it easier to breathe.  Drink enough fluid to keep your urine clear or pale yellow.   Eat soups and other clear broths and maintain good nutrition.   Rest as needed.   Return to work when your temperature has returned to normal or as your health care provider advises. You may need to stay home longer to avoid infecting others. You can also use a face mask and careful hand washing to prevent spread of the virus.  Increase the usage of your inhaler if you have asthma.   Do not use any tobacco products, including cigarettes, chewing tobacco, or electronic cigarettes. If you need help quitting, ask your health care provider. PREVENTION  The best way to protect yourself from getting a cold is to practice good hygiene.   Avoid oral or hand contact with people with cold symptoms.   Wash your hands often if contact occurs.  There is no clear evidence that vitamin C, vitamin E, echinacea, or exercise reduces the chance of developing a cold. However, it is always recommended to get plenty of rest, exercise, and practice good nutrition.  SEEK MEDICAL CARE IF:   You are getting worse rather than better.   Your symptoms are not controlled by medicine.   You have chills.  You have worsening shortness of breath.  You have brown or red mucus.  You have yellow or brown nasal discharge.  You have pain in your face, especially when you bend forward.  You have a fever.  You have swollen neck glands.  You have pain while swallowing.  You have white areas in the back of your throat. SEEK IMMEDIATE MEDICAL CARE IF:   You have severe or persistent:  Headache.  Ear pain.  Sinus pain.  Chest pain.  You have chronic lung disease and any of the following:  Wheezing.  Prolonged cough.  Coughing up blood.  A change in your usual mucus.  You have a stiff neck.  You have changes in your:  Vision.  Hearing.  Thinking.  Mood. MAKE SURE YOU:    Understand these instructions.  Will watch your condition.  Will get help right away if you are not doing well  or get worse.   This information is not intended to replace advice given to you by your health care provider. Make sure you discuss any questions you have with your health care provider.   Document Released: 07/31/2000 Document Revised: 06/21/2014 Document Reviewed: 05/12/2013 Elsevier Interactive Patient Education [employer].

## 2015-03-29 ENCOUNTER — Ambulatory Visit: Payer: Self-pay | Attending: Family Medicine

## 2015-05-26 ENCOUNTER — Other Ambulatory Visit: Payer: Self-pay

## 2015-05-26 ENCOUNTER — Encounter: Payer: Self-pay | Admitting: Family Medicine

## 2015-05-26 ENCOUNTER — Ambulatory Visit (INDEPENDENT_AMBULATORY_CARE_PROVIDER_SITE_OTHER): Payer: No Typology Code available for payment source | Admitting: Family Medicine

## 2015-05-26 ENCOUNTER — Other Ambulatory Visit: Payer: Self-pay | Admitting: Family Medicine

## 2015-05-26 VITALS — BP 120/90 | HR 73 | Temp 98.0°F | Resp 18 | Ht 66.0 in | Wt 170.0 lb

## 2015-05-26 DIAGNOSIS — G4700 Insomnia, unspecified: Secondary | ICD-10-CM

## 2015-05-26 DIAGNOSIS — Z0000 Encounter for general adult medical examination without abnormal findings: Secondary | ICD-10-CM

## 2015-05-26 DIAGNOSIS — F411 Generalized anxiety disorder: Secondary | ICD-10-CM

## 2015-05-26 DIAGNOSIS — E038 Other specified hypothyroidism: Secondary | ICD-10-CM

## 2015-05-26 LAB — CBC WITH DIFFERENTIAL/PLATELET
BASOS ABS: 83 {cells}/uL (ref 0–200)
Basophils Relative: 1 %
Eosinophils Absolute: 249 cells/uL (ref 15–500)
Eosinophils Relative: 3 %
HEMATOCRIT: 44.8 % (ref 35.0–45.0)
HEMOGLOBIN: 15.2 g/dL (ref 11.7–15.5)
LYMPHS ABS: 1909 {cells}/uL (ref 850–3900)
LYMPHS PCT: 23 %
MCH: 29.5 pg (ref 27.0–33.0)
MCHC: 33.9 g/dL (ref 32.0–36.0)
MCV: 87 fL (ref 80.0–100.0)
MONO ABS: 415 {cells}/uL (ref 200–950)
MPV: 10.4 fL (ref 7.5–12.5)
Monocytes Relative: 5 %
NEUTROS PCT: 68 %
Neutro Abs: 5644 cells/uL (ref 1500–7800)
Platelets: 241 10*3/uL (ref 140–400)
RBC: 5.15 MIL/uL — ABNORMAL HIGH (ref 3.80–5.10)
RDW: 14.5 % (ref 11.0–15.0)
WBC: 8.3 10*3/uL (ref 3.8–10.8)

## 2015-05-26 LAB — LIPID PANEL
Cholesterol: 242 mg/dL — ABNORMAL HIGH (ref 125–200)
HDL: 74 mg/dL (ref >=46)
LDL CALC: 140 mg/dL — AB (ref <130)
Total CHOL/HDL Ratio: 3.3 Ratio (ref <=5.0)
Triglycerides: 142 mg/dL (ref <150)
VLDL: 28 mg/dL (ref <30)

## 2015-05-26 LAB — HIV ANTIBODY (ROUTINE TESTING W REFLEX): HIV: NONREACTIVE

## 2015-05-26 LAB — COMPLETE METABOLIC PANEL WITH GFR
ALBUMIN: 4.2 g/dL (ref 3.6–5.1)
ALT: 18 U/L (ref 6–29)
AST: 18 U/L (ref 10–35)
Alkaline Phosphatase: 62 U/L (ref 33–130)
BUN: 12 mg/dL (ref 7–25)
CALCIUM: 9.7 mg/dL (ref 8.6–10.4)
CHLORIDE: 104 mmol/L (ref 98–110)
CO2: 24 mmol/L (ref 20–31)
CREATININE: 0.89 mg/dL (ref 0.50–1.05)
GFR, Est African American: 86 mL/min (ref >=60)
GFR, Est Non African American: 74 mL/min (ref >=60)
GLUCOSE: 87 mg/dL (ref 65–99)
POTASSIUM: 4.7 mmol/L (ref 3.5–5.3)
SODIUM: 140 mmol/L (ref 135–146)
Total Bilirubin: 0.4 mg/dL (ref 0.2–1.2)
Total Protein: 6.7 g/dL (ref 6.1–8.1)

## 2015-05-26 LAB — HEPATITIS C ANTIBODY: HCV AB: NEGATIVE

## 2015-05-26 LAB — TSH: TSH: 14.9 m[IU]/L — AB

## 2015-05-26 MED ORDER — ALBUTEROL SULFATE HFA 108 (90 BASE) MCG/ACT IN AERS: 1.0000 | INHALATION_SPRAY | Freq: Four times a day (QID) | RESPIRATORY_TRACT | Status: DC | PRN

## 2015-05-26 MED ORDER — BUDESONIDE-FORMOTEROL FUMARATE 160-4.5 MCG/ACT IN AERO: 2.0000 | INHALATION_SPRAY | Freq: Two times a day (BID) | RESPIRATORY_TRACT | Status: DC

## 2015-05-26 MED ORDER — TRAZODONE HCL 50 MG PO TABS: 50.0000 mg | ORAL_TABLET | Freq: Every evening | ORAL | Status: DC | PRN

## 2015-05-26 MED ORDER — LEVOTHYROXINE SODIUM 75 MCG PO TABS: 75.0000 ug | ORAL_TABLET | Freq: Every day | ORAL | Status: DC

## 2015-05-26 MED ORDER — TRAZODONE HCL 50 MG PO TABS
50.0000 mg | ORAL_TABLET | Freq: Every evening | ORAL | Status: DC | PRN
Start: 2015-05-26 — End: 2015-05-26

## 2015-05-26 MED FILL — SYMBICORT 160-4.5 MCG INH: 160-4.5 | 30 days supply | Qty: 10 | Fill #0

## 2015-05-26 MED FILL — traZODone HCL 50 MG TABS: 50 | 30 days supply | Qty: 30 | Fill #0

## 2015-05-26 MED FILL — LEVOTHYROXINE 75 MCG TABLET: 75 | 30 days supply | Qty: 30 | Fill #0

## 2015-05-26 NOTE — Telephone Encounter (Signed)
Refill of Levothyroxine sent to [facility] for patient

## 2015-05-26 NOTE — Telephone Encounter (Signed)
Sent medications into correct pharmacy. Thanks!

## 2015-05-26 NOTE — Patient Instructions (Signed)
May use trazodone at bedtime for sleep.  You do not need a blood pressure medications at this time.  Need to recheck in 3 months. Lot fat, low cholesterol, low carbohydrate diet Try to exercise for 30 minutes 5 out of 7 days a week. Follow-up sooner than 3 months if needed.

## 2015-05-26 NOTE — Progress Notes (Signed)
Patient ID: Jamie Mercado, female   DOB: Aug 20, 1960, 55 y.o.   MRN: [id number]   Jamie Mercado, is a 55 y.o. female  [id number]  [id number]  DOB - 1960-03-25  CC:  Chief Complaint  Patient presents with  . Establish Care    pt states taking thyroid and cholesterol medicine; feels symptoms of sweats, sore throat; indigestion; anxiety       HPI: Jamie Mercado is a 55 y.o. female here to establish care. She reports generally not feeling well, no sleeping well. Have hot and cold flashes. She  Has a history of hypothyroidism but has been out of her synthroid for a couple of months. She is in need of refills on albuterol and symbicort for asthma and synthroid. She also ask for something to help her sleep.  She reports Lipitor gives her leg cramps. She reports using about 1/2 pack cigarettes daily, only rare alcohol use and denies illicit drug use.   Health Maintenance: Is in need of mammogram. Reports having colonoscopy 2 years ago. She does need screening for HIV and Hep C.She has had a hysterectomy for cancerous cells. Her last PAP was about 2 years ago. She reports not following any particular diet and does not exercise regularly. Allergies  Allergen Reactions  . Codeine Itching and Nausea And Vomiting  . Codeine Itching, Nausea And Vomiting and Other (See Comments)    Makes her feel wierd   Past Medical History  Diagnosis Date  . Asthma   . Hypothyroidism    Current Outpatient Prescriptions on File Prior to Visit  Medication Sig Dispense Refill  . acetaminophen (TYLENOL) 500 MG tablet Take 1,000 mg by mouth every 8 (eight) hours as needed for mild pain, moderate pain or headache.    . albuterol (PROVENTIL HFA;VENTOLIN HFA) 108 (90 BASE) MCG/ACT inhaler Inhale 1-2 puffs into the lungs every 6 (six) hours as needed for wheezing. 1 Inhaler 0  . albuterol-ipratropium (COMBIVENT) 18-103 MCG/ACT inhaler Inhale 2 puffs into the lungs every 4 (four) hours as needed for wheezing or  shortness of breath.     Marland Kitchen ibuprofen (ADVIL,MOTRIN) 200 MG tablet Take 600 mg by mouth every 6 (six) hours as needed for pain.    Marland Kitchen levothyroxine (SYNTHROID, LEVOTHROID) 75 MCG tablet Take 75 mcg by mouth daily before breakfast.    . ranitidine (ZANTAC) 75 MG tablet Take 75 mg by mouth daily as needed for heartburn.    Marland Kitchen atorvastatin (LIPITOR) 20 MG tablet Take 20 mg by mouth daily. Reported on 05/26/2015    . diphenhydrAMINE (BENADRYL) 25 MG tablet Take 25 mg by mouth every 8 (eight) hours as needed for allergies. Reported on 05/26/2015    . escitalopram (LEXAPRO) 10 MG tablet Take 10 mg by mouth daily. Reported on 05/26/2015    . ipratropium (ATROVENT) 0.06 % nasal spray Place 2 sprays into both nostrils 4 (four) times daily. (Patient not taking: Reported on 05/26/2015) 15 mL 12  . ondansetron (ZOFRAN) 4 MG tablet Take 1 tablet (4 mg total) by mouth every 6 (six) hours. (Patient not taking: Reported on 05/26/2015) 12 tablet 0  . polyethylene glycol powder (GLYCOLAX/MIRALAX) powder Take 17 g by mouth daily. Until daily soft stools  OTC (Patient not taking: Reported on 05/26/2015) 255 g 0  . zolpidem (AMBIEN) 10 MG tablet Take 10 mg by mouth at bedtime as needed for sleep.     No current facility-administered medications on file prior to visit.   Family History  Problem Relation Age  of Onset  . Diabetes Father   . Heart disease Father   . Hypertension Father   . Cancer Paternal Grandmother     breast cancer   Social History   Social History  . Marital Status: Legally Separated    Spouse Name: N/A  . Number of Children: N/A  . Years of Education: N/A   Occupational History  . Not on file.   Social History Main Topics  . Smoking status: Current Every Day Smoker -- 0.50 packs/day    Types: Cigarettes    Last Attempt to Quit: 12/09/2010  . Smokeless tobacco: Not on file  . Alcohol Use: Yes  . Drug Use: No  . Sexual Activity: Yes    Birth Control/ Protection: Surgical   Other Topics  Concern  . Not on file   Social History Narrative   ** Merged History Encounter **        Review of Systems: Constitutional: Negative for fever, chills, appetite change, weight loss,  Fatigue. Skin: Negative for rashes or lesions of concern. HENT: Negative for ear pain, ear discharge.nose bleeds. Positive for nasal congestion due to allergies.Reports throat feels funny. Some difficulty with swallowing Eyes: Negative for pain, discharge, redness, itching and visual disturbance. Neck: Negative for pain, stiffness Respiratory: Positive for cough, wheezing, shortness of breath on occassion  Cardiovascular: Negative for chest pain, and leg swelling.Positive for palpitations  Due to anxiety Gastrointestinal: Negative for abdominal pain, nausea, vomiting, diarrhea, constipations Genitourinary: Negative for dysuria, urgency, frequency, hematuria,  Musculoskeletal: Negative for back pain, joint pain, joint  swelling, and gait problem.Negative for weakness. Neurological: Negative for tremors, seizures, syncope,   light-headedness, numbness and headaches. Positive for occassional dizziness Hematological: Negative for easy bruising or bleeding Psychiatric/Behavioral: Negative for depression. Positive for anxiety.   Objective:   Filed Vitals:   05/26/15 0844  BP: 147/105  Pulse: 73  Temp: 98 F (36.7 C)  Resp: 18    Physical Exam: Constitutional: Patient appears well-developed and well-nourished. No distress. HENT: Normocephalic, atraumatic, External right and left ear normal. Oropharynx is clear and moist.  Eyes: Conjunctivae and EOM are normal. PERRLA, no scleral icterus. Neck: Normal ROM. Neck supple. No lymphadenopathy, No thyromegaly. CVS: RRR, S1/S2 +, no murmurs, no gallops, no rubs Pulmonary: Effort and breath sounds normal, no stridor, rhonchi, wheezes, rales.  Abdominal: Soft. Normoactive BS,, no distension, tenderness, rebound or guarding.  Musculoskeletal: Normal range of  motion. No edema and no tenderness.  Neuro: Alert.Normal muscle tone coordination. Non-focal Skin: Skin is warm and dry. No rash noted. Not diaphoretic. No erythema. No pallor. Psychiatric: Normal mood and affect. Behavior, judgment, thought content normal.  Lab Results  Component Value Date   WBC 6.2 07/31/2012   HGB 14.9 07/31/2012   HCT 42.2 07/31/2012   MCV 81.6 07/31/2012   PLT 247 07/31/2012   Lab Results  Component Value Date   CREATININE 0.64 07/31/2012   BUN 9 07/31/2012   NA 142 07/31/2012   K 4.0 07/31/2012   CL 105 07/31/2012   CO2 29 07/31/2012    No results found for: HGBA1C Lipid Panel     Component Value Date/Time   CHOL  09/23/2006 0443    195        ATP III CLASSIFICATION:  <200     mg/dL   Desirable  200-239  mg/dL   Borderline High  >=240    mg/dL   High   TRIG 157* 09/23/2006 0443   HDL 57 09/23/2006 0443  CHOLHDL 3.4 09/23/2006 0443   VLDL 31 09/23/2006 0443   LDLCALC * 09/23/2006 0443    107        Total Cholesterol/HDL:CHD Risk Coronary Heart Disease Risk Table                     Men   Women  1/2 Average Risk   3.4   3.3       Assessment and plan:   1. Other specified hypothyroidism  - TSH  2. Health care maintenance  - COMPLETE METABOLIC PANEL WITH GFR - CBC with Differential - Lipid panel - HIV antibody (with reflex) - Hepatitis C antibody (reflex, frozen specimen) - MM DIGITAL SCREENING BILATERAL; Future  3. Insomnia - Trazodone 50 mg, #30, one po q hs for sleep.   No Follow-up on file.  The patient was given clear instructions to go to ER or return to medical center if symptoms don't improve, worsen or new problems develop. The patient verbalized understanding.    Micheline Chapman FNP  05/26/2015, 9:23 AM

## 2015-06-02 ENCOUNTER — Other Ambulatory Visit: Payer: Self-pay | Admitting: Family Medicine

## 2015-06-02 DIAGNOSIS — Z1231 Encounter for screening mammogram for malignant neoplasm of breast: Secondary | ICD-10-CM

## 2015-06-19 ENCOUNTER — Other Ambulatory Visit (HOSPITAL_COMMUNITY): Payer: Self-pay | Admitting: *Deleted

## 2015-06-19 DIAGNOSIS — N6320 Unspecified lump in the left breast, unspecified quadrant: Secondary | ICD-10-CM

## 2015-06-28 MED FILL — LEVOTHYROXINE 75 MCG TABLET: 75 | 30 days supply | Qty: 30 | Fill #1

## 2015-07-13 ENCOUNTER — Ambulatory Visit (HOSPITAL_COMMUNITY)
Admission: RE | Admit: 2015-07-13 | Discharge: 2015-07-13 | Disposition: A | Discharge status: Home / Self Care | Payer: No Typology Code available for payment source | Source: Ambulatory Visit | Attending: Obstetrics and Gynecology | Admitting: Obstetrics and Gynecology

## 2015-07-13 ENCOUNTER — Encounter (HOSPITAL_COMMUNITY): Payer: Self-pay

## 2015-07-13 ENCOUNTER — Ambulatory Visit
Admission: RE | Admit: 2015-07-13 | Discharge: 2015-07-13 | Disposition: A | Discharge status: Home / Self Care | Payer: No Typology Code available for payment source | Source: Ambulatory Visit | Attending: Obstetrics and Gynecology | Admitting: Obstetrics and Gynecology

## 2015-07-13 VITALS — BP 114/72 | Temp 97.7°F | Ht 65.5 in | Wt 179.4 lb

## 2015-07-13 DIAGNOSIS — N6323 Unspecified lump in the left breast, lower outer quadrant: Secondary | ICD-10-CM

## 2015-07-13 DIAGNOSIS — Z1239 Encounter for other screening for malignant neoplasm of breast: Secondary | ICD-10-CM

## 2015-07-13 DIAGNOSIS — N6320 Unspecified lump in the left breast, unspecified quadrant: Secondary | ICD-10-CM

## 2015-07-13 NOTE — Patient Instructions (Addendum)
Educational materials on self breast awareness given. Explained to Jamie Mercado that she did not need a Pap smear today due to her history of a hysterectomy for benign reasons. Informed patient that she doesn't need any further Pap smears due to her history of a hysterectomy for benign reasons. Referred patient to the [name] for a diagnostic mammogram. Appointment scheduled for Thursday, Jul 13, 2015 at 0830. Smoking cessation discussed with patient. Referred patient to the [facility] Quitline and gave resources to free smoking cessation classes offered at the [facility]. [name] verbalized understanding.  [place], Arvil Chaco, RN 9:56 AM

## 2015-07-13 NOTE — Progress Notes (Signed)
Complaints of a left breast lump x 4 years that has a achy weird feeling per patient. Patient stated the lump has increased in size.  Pap Smear: Pap smear not completed today. Last Pap smear was 5 years ago at [facility] and normal per patient. Per patient has a history of an abnormal Pap smear when she was 55 years old that a [facility] was completed for follow up. Patient stated all Pap smears have been normal since CKC. 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 Patient no longer needs to have Pap smears completed due to her history of a hysterectomy for benign reasons per BCCCP and ACOG guidelines. No Pap smear results are in EPIC.  Physical exam: Breasts Breasts symmetrical. No skin abnormalities bilateral breasts. No nipple retraction bilateral breasts. No nipple discharge bilateral breasts. No lymphadenopathy. No lumps palpated right breast. Palpated a pea sized lump within the left breast at 5 o'clock 4 cm from the nipple. Complaints of tenderness when palpated left outer breast on exam. Referred patient to the [facility] for a diagnostic mammogram. Appointment scheduled for Thursday, Jul 13, 2015 at 0830.   Pelvic/Bimanual No Pap smear completed today since patient has a history of a hysterectomy for benign reasons. Pap smear not indicated per BCCCP guidelines.   Smoking History: Patient is a current smoker.Smoking cessation discussed with patient. Referred patient to the [facility] Quitline and gave resources to free smoking cessation classes offered at the [facility].   Patient Navigation: Patient education provided. Access to services provided for patient through Normal program.   Colorectal Cancer Screening: Per patient had a colonoscopy completed two years ago. Patient stated her next one is due in three years due polyps being found. No complaints today.

## 2015-07-21 ENCOUNTER — Encounter (HOSPITAL_COMMUNITY): Payer: Self-pay | Admitting: *Deleted

## 2015-08-11 MED FILL — LEVOTHYROXINE 75 MCG TABLET: 75 | 30 days supply | Qty: 30 | Fill #2

## 2015-08-25 ENCOUNTER — Ambulatory Visit: Payer: Self-pay | Admitting: Family Medicine

## 2015-08-29 ENCOUNTER — Encounter: Payer: Self-pay | Admitting: Family Medicine

## 2015-08-29 ENCOUNTER — Ambulatory Visit (INDEPENDENT_AMBULATORY_CARE_PROVIDER_SITE_OTHER): Payer: No Typology Code available for payment source | Admitting: Family Medicine

## 2015-08-29 VITALS — BP 132/72 | HR 71 | Temp 98.2°F | Resp 18 | Ht 64.0 in | Wt 182.0 lb

## 2015-08-29 DIAGNOSIS — Z720 Tobacco use: Secondary | ICD-10-CM

## 2015-08-29 DIAGNOSIS — G4700 Insomnia, unspecified: Secondary | ICD-10-CM

## 2015-08-29 DIAGNOSIS — R300 Dysuria: Secondary | ICD-10-CM

## 2015-08-29 DIAGNOSIS — E039 Hypothyroidism, unspecified: Secondary | ICD-10-CM

## 2015-08-29 DIAGNOSIS — Z1211 Encounter for screening for malignant neoplasm of colon: Secondary | ICD-10-CM

## 2015-08-29 LAB — POCT URINALYSIS DIP (DEVICE)
BILIRUBIN URINE: NEGATIVE
GLUCOSE, UA: NEGATIVE mg/dL
HGB URINE DIPSTICK: NEGATIVE
Ketones, ur: NEGATIVE mg/dL
LEUKOCYTES UA: NEGATIVE
NITRITE: NEGATIVE
Protein, ur: NEGATIVE mg/dL
Specific Gravity, Urine: >=1.03 (ref 1.005–1.030)
Urobilinogen, UA: 0.2 mg/dL (ref 0.0–1.0)
pH: 5.5 (ref 5.0–8.0)

## 2015-08-29 LAB — T3, FREE: T3 FREE: 3.1 pg/mL (ref 2.3–4.2)

## 2015-08-29 LAB — TSH: TSH: 4.18 m[IU]/L

## 2015-08-29 LAB — T4, FREE: FREE T4: 1.3 ng/dL (ref 0.8–1.8)

## 2015-08-29 MED ORDER — NICOTINE 14 MG/24HR TD PT24: 14.0000 mg | MEDICATED_PATCH | Freq: Every day | TRANSDERMAL | Status: DC

## 2015-08-29 MED ORDER — TEMAZEPAM 15 MG PO CAPS: 15.0000 mg | ORAL_CAPSULE | Freq: Every evening | ORAL | Status: DC | PRN

## 2015-08-29 NOTE — Patient Instructions (Signed)
Will try temazepam for sleep.

## 2015-08-29 NOTE — Progress Notes (Signed)
Patient is here for FU   Patient complains of lower back pain being present for the past few days. Patient complains of increase in urination.  Patient has taken medication today and patient has eaten.

## 2015-08-30 LAB — GC/CHLAMYDIA PROBE AMP
CT Probe RNA: NOT DETECTED
GC Probe RNA: NOT DETECTED

## 2015-08-31 ENCOUNTER — Telehealth: Payer: Self-pay | Admitting: *Deleted

## 2015-08-31 NOTE — Telephone Encounter (Signed)
Called patient, advised of all labs normal. Patient verbalized understanding. Thanks!

## 2015-08-31 NOTE — Telephone Encounter (Signed)
Patient called inquiring about her lab results. Patient is requesting a call back. Her call back number is [phone] or [phone]) [phone]. Thanks

## 2015-09-01 ENCOUNTER — Ambulatory Visit (INDEPENDENT_AMBULATORY_CARE_PROVIDER_SITE_OTHER): Payer: No Typology Code available for payment source

## 2015-09-01 DIAGNOSIS — Z1211 Encounter for screening for malignant neoplasm of colon: Secondary | ICD-10-CM

## 2015-09-01 LAB — POC HEMOCCULT BLD/STL (HOME/3-CARD/SCREEN)
Card #2 Fecal Occult Blod, POC: NEGATIVE
Card #3 Fecal Occult Blood, POC: NEGATIVE
Fecal Occult Blood, POC: NEGATIVE

## 2015-09-06 NOTE — Progress Notes (Signed)
Patient ID: Jamie Mercado, female   DOB: [birth year], 55 y.o.   MRN: [id number]   Jamie Mercado, is a 55 y.o. female  [id number]  [id number]  DOB - 1960-08-22  CC:  Chief Complaint  Patient presents with  . Follow-up    3 month       HPI: Jamie Mercado is a 55 y.o. female here for follow-up of hypothyroidism and asthma. She is on albuterol and Symbicort for asthma. She is on synthroid 75 mcg. She is on trazadone 50 for sleep, Take otc Zantac for occasional heartburn. She does continue to smoke and would like to try nicoderm patches to quit.   She offers no specific complaints today.  Allergies  Allergen Reactions  . Codeine Itching and Nausea And Vomiting  . Codeine Itching, Nausea And Vomiting and Other (See Comments)    Makes her feel wierd   Past Medical History  Diagnosis Date  . Asthma   . Hypothyroidism    Current Outpatient Prescriptions on File Prior to Visit  Medication Sig Dispense Refill  . acetaminophen (TYLENOL) 500 MG tablet Take 1,000 mg by mouth every 8 (eight) hours as needed for mild pain, moderate pain or headache.    . albuterol (PROVENTIL HFA;VENTOLIN HFA) 108 (90 Base) MCG/ACT inhaler Inhale 1-2 puffs into the lungs every 6 (six) hours as needed for wheezing. 1 Inhaler 0  . albuterol-ipratropium (COMBIVENT) 18-103 MCG/ACT inhaler Inhale 2 puffs into the lungs every 4 (four) hours as needed for wheezing or shortness of breath.     . budesonide-formoterol (SYMBICORT) 160-4.5 MCG/ACT inhaler Inhale 2 puffs into the lungs 2 (two) times daily. 1 Inhaler 3  . diphenhydrAMINE (BENADRYL) 25 MG tablet Take 25 mg by mouth every 8 (eight) hours as needed for allergies. Reported on 05/26/2015    . ibuprofen (ADVIL,MOTRIN) 200 MG tablet Take 600 mg by mouth every 6 (six) hours as needed for pain.    Marland Kitchen levothyroxine (SYNTHROID, LEVOTHROID) 75 MCG tablet Take 1 tablet (75 mcg total) by mouth daily before breakfast. 30 tablet 3  . ranitidine (ZANTAC) 75 MG tablet  Take 75 mg by mouth daily as needed for heartburn.    . traZODone (DESYREL) 50 MG tablet Take 1 tablet (50 mg total) by mouth at bedtime as needed for sleep. 30 tablet 3  . escitalopram (LEXAPRO) 10 MG tablet Take 10 mg by mouth daily. Reported on 08/29/2015    . ondansetron (ZOFRAN) 4 MG tablet Take 1 tablet (4 mg total) by mouth every 6 (six) hours. (Patient not taking: Reported on 05/26/2015) 12 tablet 0  . polyethylene glycol powder (GLYCOLAX/MIRALAX) powder Take 17 g by mouth daily. Until daily soft stools  OTC (Patient not taking: Reported on 05/26/2015) 255 g 0   No current facility-administered medications on file prior to visit.   Family History  Problem Relation Age of Onset  . Diabetes Father   . Heart disease Father   . Hypertension Father   . Cancer Paternal Grandmother    Social History   Social History  . Marital Status: Legally Separated    Spouse Name: N/A  . Number of Children: N/A  . Years of Education: N/A   Occupational History  . Not on file.   Social History Main Topics  . Smoking status: Current Every Day Smoker -- 0.50 packs/day    Types: Cigarettes  . Smokeless tobacco: Not on file  . Alcohol Use: Yes     Comment: socially  . Drug Use: No  .  Sexual Activity: Yes    Birth Control/ Protection: Surgical   Other Topics Concern  . Not on file   Social History Narrative   ** Merged History Encounter **        Review of Systems: Constitutional: Negative for fever, chills, appetite change, weight loss,  Fatigue. Skin: Negative for rashes or lesions of concern. HENT: Negative for ear pain, ear discharge.nose bleeds. Some intermittent nasal congestion Eyes: Negative for pain, discharge, redness, itching and visual disturbance. Neck: Negative for pain, stiffness Respiratory: Positive for intermittent   cough, shortness of breath,   Cardiovascular: Negative for chest pain, and leg swelling.Positive for perceived palpations due to  anxiety Gastrointestinal: Negative for abdominal pain, nausea, vomiting, diarrhea, constipations Genitourinary: Negative for dysuria, urgency, frequency, hematuria,  Musculoskeletal: Negative for back pain, joint pain, joint  swelling, and gait problem.Negative for weakness. Neurological: Negative for dizziness, tremors, seizures, syncope,   light-headedness, numbness and headaches.  Hematological: Negative for easy bruising or bleeding Psychiatric/Behavioral: Negative for depression, decreased concentration, confusion. Positive for anxiety   Objective:   Filed Vitals:   08/29/15 1350  BP: 132/72  Pulse: 71  Temp: 98.2 F (36.8 C)  Resp: 18    Physical Exam: Constitutional: Patient appears well-developed and well-nourished. No distress. HENT: Normocephalic, atraumatic, External right and left ear normal. Oropharynx is clear and moist.  Eyes: Conjunctivae and EOM are normal. PERRLA, no scleral icterus. Neck: Normal ROM. Neck supple. No lymphadenopathy, No thyromegaly. CVS: RRR, S1/S2 +, no murmurs, no gallops, no rubs Pulmonary: Effort and breath sounds normal, no stridor, rhonchi, wheezes, rales.  Abdominal: Soft. Normoactive BS,, no distension, tenderness, rebound or guarding.  Musculoskeletal: Normal range of motion. No edema and no tenderness.  Neuro: Alert.Normal muscle tone coordination. Non-focal Skin: Skin is warm and dry. No rash noted. Not diaphoretic. No erythema. No pallor. Psychiatric: Normal mood and affect. Behavior, judgment, thought content normal.  Lab Results  Component Value Date   WBC 8.3 05/26/2015   HGB 15.2 05/26/2015   HCT 44.8 05/26/2015   MCV 87.0 05/26/2015   PLT 241 05/26/2015   Lab Results  Component Value Date   CREATININE 0.89 05/26/2015   BUN 12 05/26/2015   NA 140 05/26/2015   K 4.7 05/26/2015   CL 104 05/26/2015   CO2 24 05/26/2015    No results found for: HGBA1C Lipid Panel     Component Value Date/Time   CHOL 242* 05/26/2015  0926   TRIG 142 05/26/2015 0926   HDL 74 05/26/2015 0926   CHOLHDL 3.3 05/26/2015 0926   VLDL 28 05/26/2015 0926   LDLCALC 140* 05/26/2015 0926       Assessment and plan:   1. Hypothyroidism, unspecified hypothyroidism type  - TSH - T3, Free - T4, Free  2. Colon cancer screening  - POC Hemoccult Bld/Stl (3-Cd Home Screen); Future  3. Dysuria  - GC/Chlamydia Probe Amp   No Follow-up on file.  The patient was given clear instructions to go to ER or return to medical center if symptoms don't improve, worsen or new problems develop. The patient verbalized understanding.    Micheline Chapman FNP  09/06/2015, 3:25 PM

## 2015-09-15 MED FILL — ?LEVOTHYROXINE 75 MCG TABLE: 75 | 30 days supply | Qty: 30 | Fill #3

## 2015-09-24 IMAGING — US US SOFT TISSUE HEAD/NECK
1 series · 14 of 25 positions shown · non-contrast
Comparison: None.

CLINICAL DATA: 55-year-old female with a history of hypothyroidism.

EXAM:
THYROID ULTRASOUND
TECHNIQUE: Ultrasound examination of the thyroid gland and adjacent soft
tissues was performed.

[Series 1: us soft tissue head/neck · 0.06mm/px · 39 acquisitions, 14 frames shown]
[im 1/39]
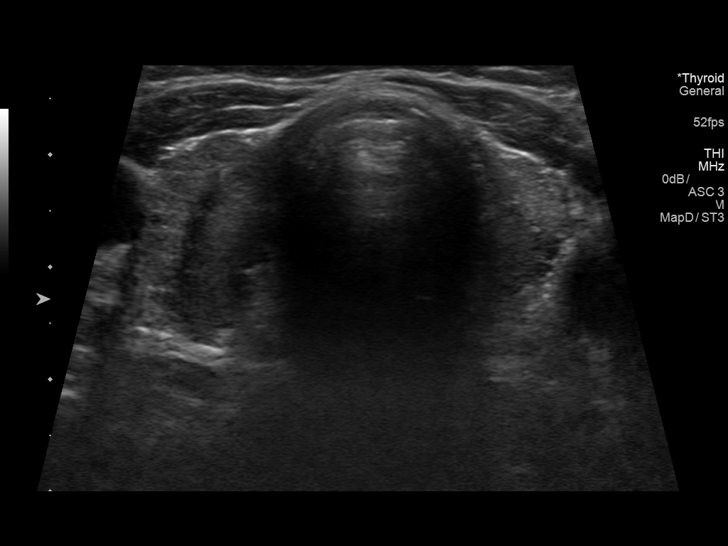
[im 4/39]
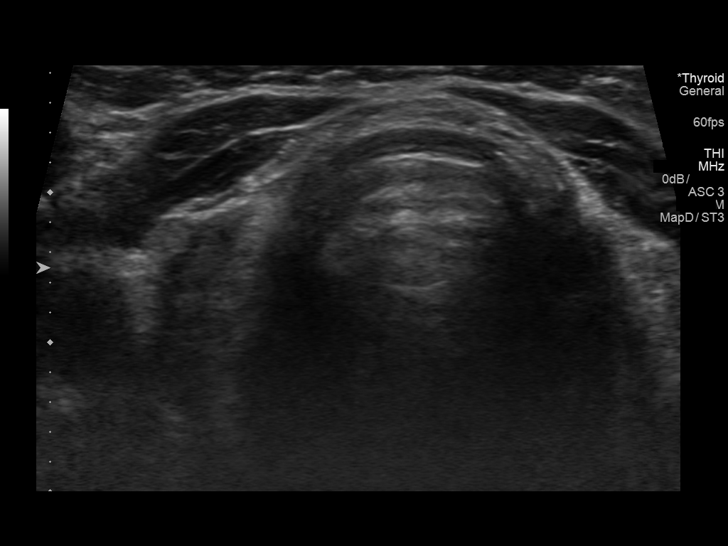
[im 7/39]
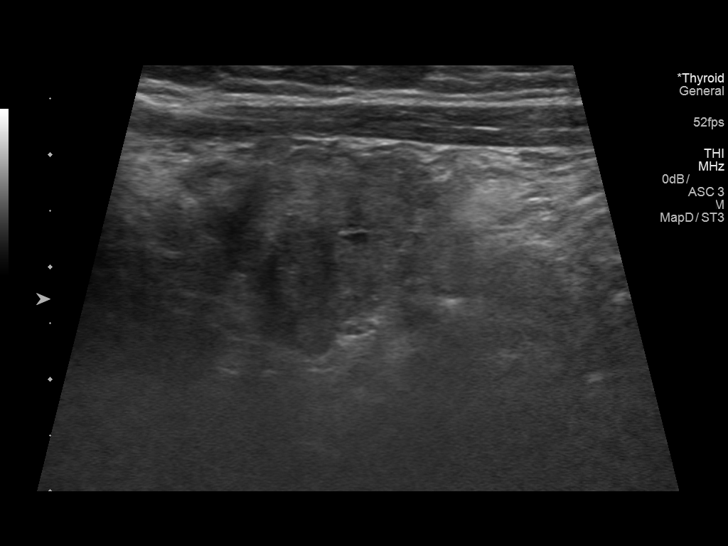
[im 10/39]
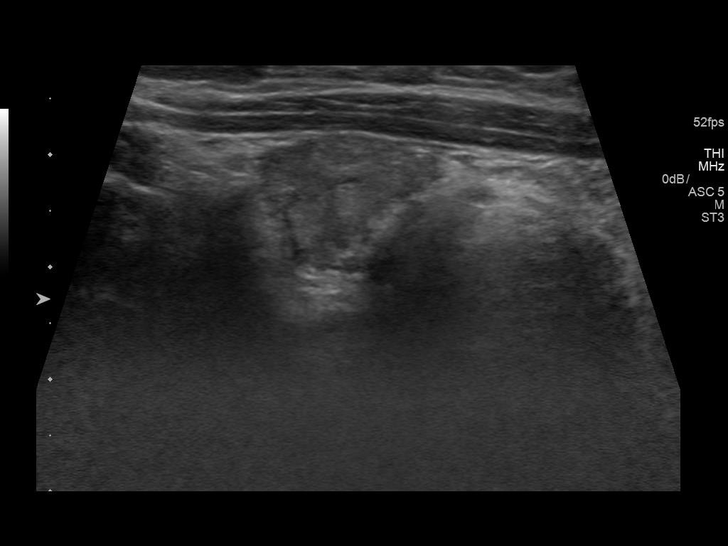
[im 13/39]
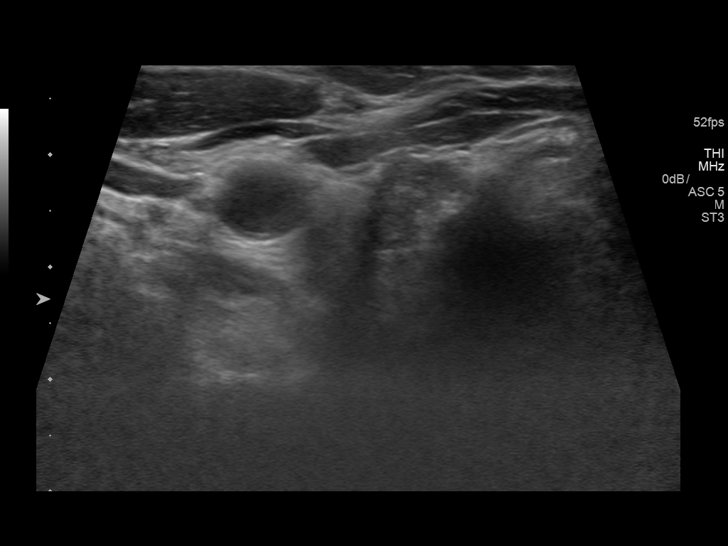
[im 15/39]
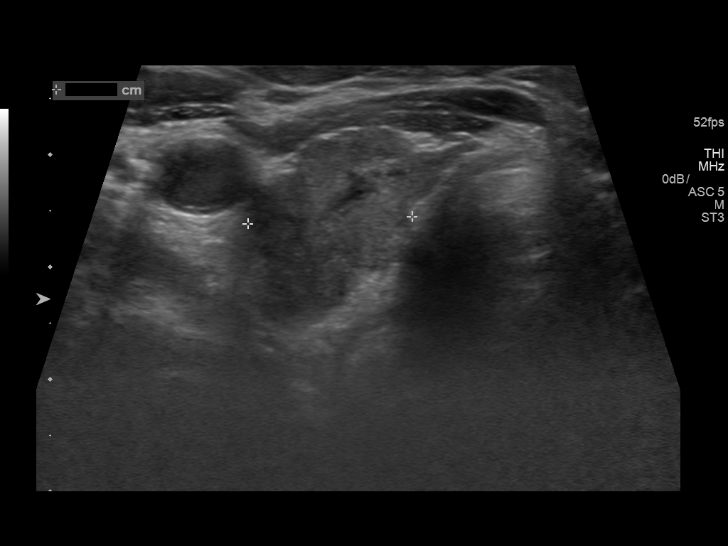
[im 18/39]
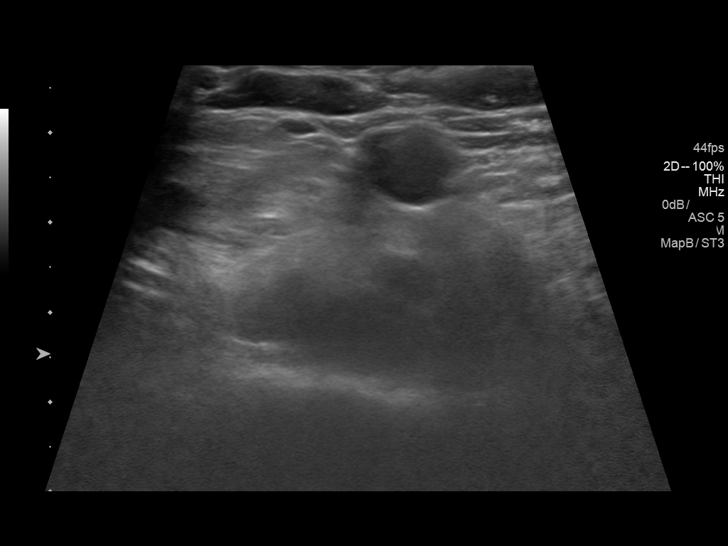
[im 21/39]
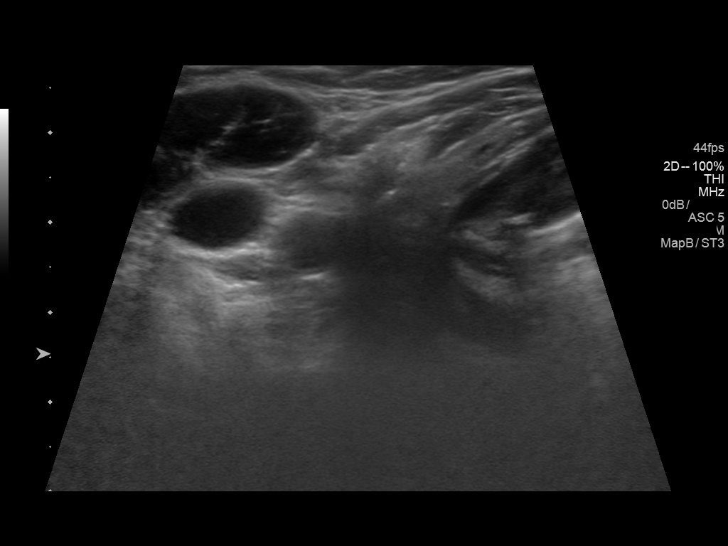
[im 24/39]
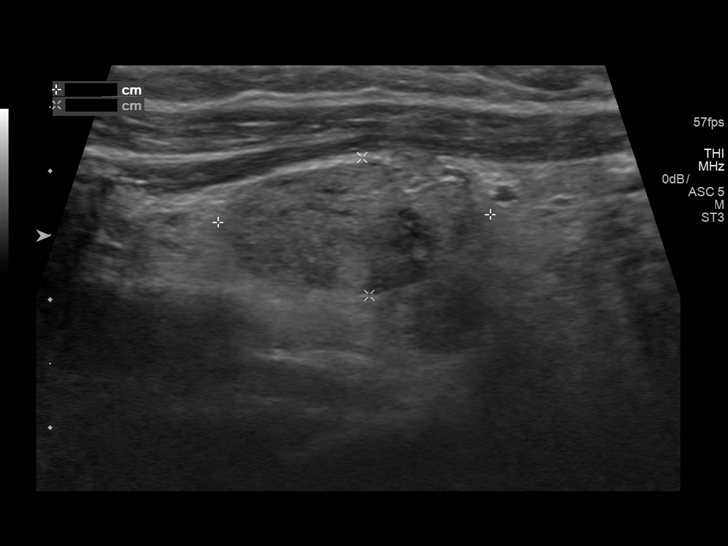
[im 26/39]
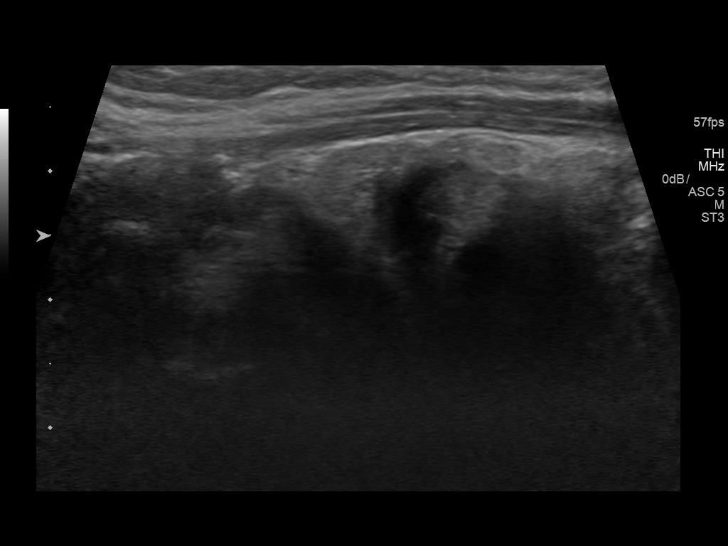
[im 29/39]
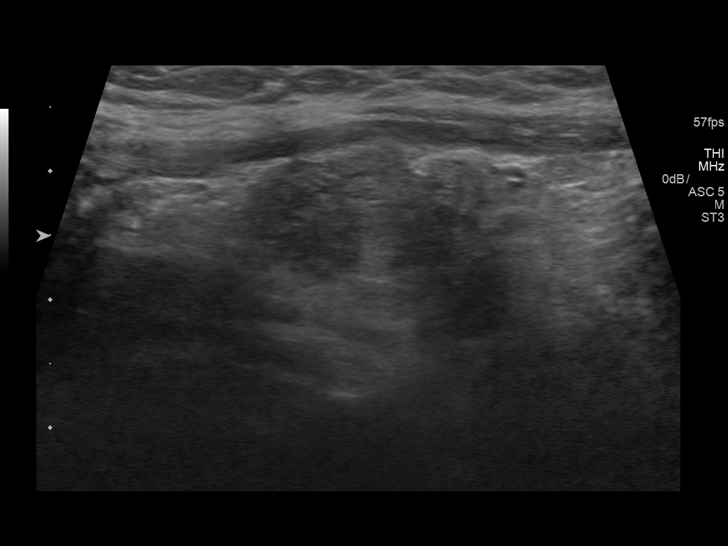
[im 32/39]
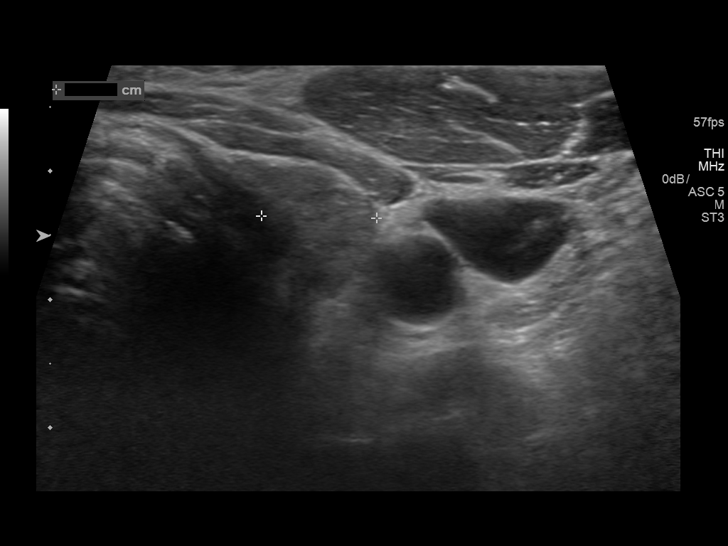
[im 35/39]
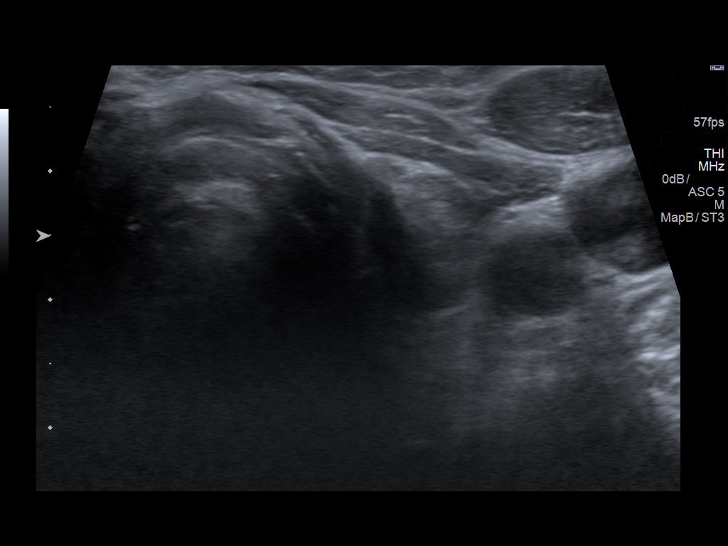
[im 39/39]
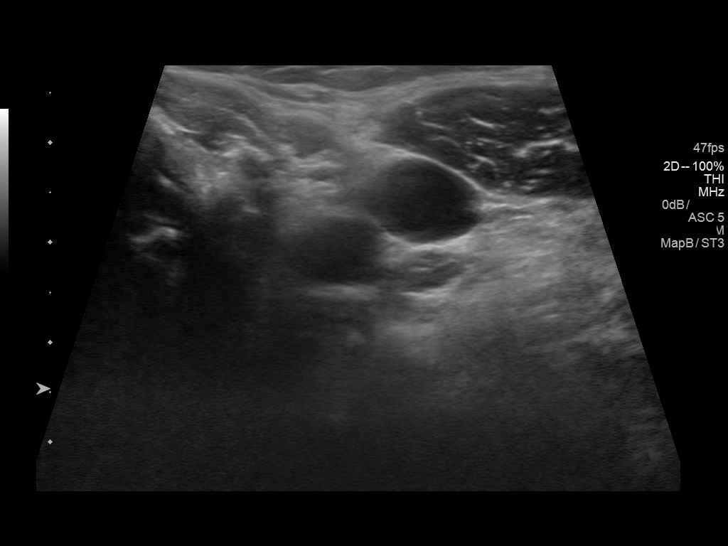

[14 of 25 positions shown; findings below may reference images not displayed]

FINDINGS: Parenchymal Echotexture: Mildly heterogenous

Isthmus: 0.3 cm

Right lobe: 2.5 cm x 2.0 cm x 1.5 cm

Left lobe: 2.1 cm x 1.1 cm x 0.9 cm

_________________________________________________________

Estimated total number of nodules >/= 1 cm: 0

Number of spongiform nodules >/=  2 cm not described below (TR1): 0

Number of mixed cystic and solid nodules >/= 1.5 cm not described
below (TR2): 0

_________________________________________________________

No discrete nodules are seen within the thyroid gland.
IMPRESSION: Heterogeneous thyroid potentially representing medical thyroid
disease.

## 2015-10-18 ENCOUNTER — Other Ambulatory Visit: Payer: Self-pay | Admitting: Family Medicine

## 2015-10-18 ENCOUNTER — Telehealth: Payer: Self-pay

## 2015-10-18 NOTE — Telephone Encounter (Signed)
This has been sent into pharmacy. Thanks!  

## 2015-10-25 MED FILL — LEVOTHYROXINE 75 MCG TABLET: 75 | 30 days supply | Qty: 30 | Fill #0

## 2015-12-04 ENCOUNTER — Ambulatory Visit: Payer: Self-pay | Admitting: Family Medicine

## 2015-12-20 ENCOUNTER — Ambulatory Visit: Payer: Self-pay

## 2016-01-03 ENCOUNTER — Ambulatory Visit: Payer: Self-pay | Attending: Internal Medicine

## 2016-01-04 ENCOUNTER — Encounter: Payer: Self-pay | Admitting: Family Medicine

## 2016-01-04 ENCOUNTER — Other Ambulatory Visit: Payer: Self-pay

## 2016-01-04 ENCOUNTER — Ambulatory Visit (INDEPENDENT_AMBULATORY_CARE_PROVIDER_SITE_OTHER): Payer: No Typology Code available for payment source | Admitting: Family Medicine

## 2016-01-04 VITALS — BP 144/92 | HR 69 | Temp 97.5°F | Resp 18 | Ht 64.0 in | Wt 187.0 lb

## 2016-01-04 DIAGNOSIS — Z23 Encounter for immunization: Secondary | ICD-10-CM

## 2016-01-04 DIAGNOSIS — N63 Unspecified lump in unspecified breast: Secondary | ICD-10-CM

## 2016-01-04 DIAGNOSIS — J45909 Unspecified asthma, uncomplicated: Secondary | ICD-10-CM

## 2016-01-04 DIAGNOSIS — E039 Hypothyroidism, unspecified: Secondary | ICD-10-CM

## 2016-01-04 DIAGNOSIS — Z0000 Encounter for general adult medical examination without abnormal findings: Secondary | ICD-10-CM

## 2016-01-04 LAB — CBC WITH DIFFERENTIAL/PLATELET
BASOS PCT: 0 %
Basophils Absolute: 0 cells/uL (ref 0–200)
EOS ABS: 268 {cells}/uL (ref 15–500)
Eosinophils Relative: 4 %
HEMATOCRIT: 42.8 % (ref 35.0–45.0)
Hemoglobin: 14.5 g/dL (ref 11.7–15.5)
LYMPHS PCT: 33 %
Lymphs Abs: 2211 cells/uL (ref 850–3900)
MCH: 28.9 pg (ref 27.0–33.0)
MCHC: 33.9 g/dL (ref 32.0–36.0)
MCV: 85.3 fL (ref 80.0–100.0)
MONOS PCT: 5 %
MPV: 10.7 fL (ref 7.5–12.5)
Monocytes Absolute: 335 cells/uL (ref 200–950)
NEUTROS PCT: 58 %
Neutro Abs: 3886 cells/uL (ref 1500–7800)
PLATELETS: 244 10*3/uL (ref 140–400)
RBC: 5.02 MIL/uL (ref 3.80–5.10)
RDW: 14.4 % (ref 11.0–15.0)
WBC: 6.7 10*3/uL (ref 3.8–10.8)

## 2016-01-04 LAB — COMPLETE METABOLIC PANEL WITH GFR
ALT: 40 U/L — AB (ref 6–29)
AST: 30 U/L (ref 10–35)
Albumin: 4.3 g/dL (ref 3.6–5.1)
Alkaline Phosphatase: 59 U/L (ref 33–130)
BILIRUBIN TOTAL: 0.5 mg/dL (ref 0.2–1.2)
BUN: 10 mg/dL (ref 7–25)
CALCIUM: 9.9 mg/dL (ref 8.6–10.4)
CO2: 28 mmol/L (ref 20–31)
CREATININE: 0.76 mg/dL (ref 0.50–1.05)
Chloride: 105 mmol/L (ref 98–110)
GFR, Est Non African American: 89 mL/min (ref >=60)
Glucose, Bld: 103 mg/dL — ABNORMAL HIGH (ref 65–99)
Potassium: 4.3 mmol/L (ref 3.5–5.3)
Sodium: 141 mmol/L (ref 135–146)
TOTAL PROTEIN: 6.8 g/dL (ref 6.1–8.1)

## 2016-01-04 MED ORDER — BUDESONIDE-FORMOTEROL FUMARATE 160-4.5 MCG/ACT IN AERO: 2.0000 | INHALATION_SPRAY | Freq: Two times a day (BID) | RESPIRATORY_TRACT | 3 refills | Status: DC

## 2016-01-04 MED ORDER — LEVOTHYROXINE SODIUM 75 MCG PO TABS: ORAL_TABLET | ORAL | 1 refills | Status: DC

## 2016-01-04 MED ORDER — IPRATROPIUM-ALBUTEROL 18-103 MCG/ACT IN AERO: 2.0000 | INHALATION_SPRAY | RESPIRATORY_TRACT | 3 refills | Status: DC | PRN

## 2016-01-04 MED ORDER — BENZONATATE 100 MG PO CAPS: 100.0000 mg | ORAL_CAPSULE | Freq: Two times a day (BID) | ORAL | 1 refills | Status: DC | PRN

## 2016-01-04 MED ORDER — MOMETASONE FURO-FORMOTEROL FUM 100-5 MCG/ACT IN AERO: 2.0000 | INHALATION_SPRAY | Freq: Two times a day (BID) | RESPIRATORY_TRACT | 2 refills | Status: DC

## 2016-01-04 NOTE — Telephone Encounter (Signed)
Sent tessalon pearls into correct pharmacy. Thanks!

## 2016-01-05 LAB — HEMOGLOBIN A1C
Hgb A1c MFr Bld: 5.6 % (ref <5.7)
MEAN PLASMA GLUCOSE: 114 mg/dL

## 2016-01-08 NOTE — Progress Notes (Signed)
Jamie Mercado, is a 55 y.o. female  [id number]  [id number]  DOB - 17-Feb-1961  CC:  Chief Complaint  Patient presents with  . Cough    x 3 weeks   . Follow-up       HPI: Jamie Mercado is a 55 y.o. female here for follow-up asthma, hypothyroidism. She is currently on Symbicort, albuterol and levothyroxine. She reports needing refills. She reports using her symbicort regularly and using albuterol 1-2 times a day. She does report a current cough. She continues to smoke 1/2 pk cigarettes daily.  Her BP today is 151/95. Repeat 144/92. Previous readings have been normal.  Health Maintenance: She declines flu shot but will receive Tdap today. Her mammogram is up to date and she has had a hysterectomy.    Allergies  Allergen Reactions  . Codeine Itching and Nausea And Vomiting  . Codeine Itching, Nausea And Vomiting and Other (See Comments)    Makes her feel wierd   Past Medical History:  Diagnosis Date  . Asthma   . Hypothyroidism    Current Outpatient Prescriptions on File Prior to Visit  Medication Sig Dispense Refill  . acetaminophen (TYLENOL) 500 MG tablet Take 1,000 mg by mouth every 8 (eight) hours as needed for mild pain, moderate pain or headache.    . albuterol (PROVENTIL HFA;VENTOLIN HFA) 108 (90 Base) MCG/ACT inhaler Inhale 1-2 puffs into the lungs every 6 (six) hours as needed for wheezing. 1 Inhaler 0  . diphenhydrAMINE (BENADRYL) 25 MG tablet Take 25 mg by mouth every 8 (eight) hours as needed for allergies. Reported on 05/26/2015    . ibuprofen (ADVIL,MOTRIN) 200 MG tablet Take 600 mg by mouth every 6 (six) hours as needed for pain.    . ranitidine (ZANTAC) 75 MG tablet Take 75 mg by mouth daily as needed for heartburn.    . escitalopram (LEXAPRO) 10 MG tablet Take 10 mg by mouth daily. Reported on 08/29/2015    . nicotine (NICODERM CQ) 14 mg/24hr patch Place 1 patch (14 mg total) onto the skin daily. (Patient not taking: Reported on 01/04/2016) 28 patch 1  .  ondansetron (ZOFRAN) 4 MG tablet Take 1 tablet (4 mg total) by mouth every 6 (six) hours. (Patient not taking: Reported on 01/04/2016) 12 tablet 0  . polyethylene glycol powder (GLYCOLAX/MIRALAX) powder Take 17 g by mouth daily. Until daily soft stools  OTC (Patient not taking: Reported on 01/04/2016) 255 g 0  . temazepam (RESTORIL) 15 MG capsule Take 1 capsule (15 mg total) by mouth at bedtime as needed for sleep. (Patient not taking: Reported on 01/04/2016) 30 capsule 0  . traZODone (DESYREL) 50 MG tablet Take 1 tablet (50 mg total) by mouth at bedtime as needed for sleep. (Patient not taking: Reported on 01/04/2016) 30 tablet 3   No current facility-administered medications on file prior to visit.    Family History  Problem Relation Age of Onset  . Diabetes Father   . Heart disease Father   . Hypertension Father   . Cancer Paternal Grandmother    Social History   Social History  . Marital status: Legally Separated    Spouse name: N/A  . Number of children: N/A  . Years of education: N/A   Occupational History  . Not on file.   Social History Main Topics  . Smoking status: Current Every Day Smoker    Packs/day: 0.50    Types: Cigarettes  . Smokeless tobacco: Never Used  . Alcohol use Yes  Comment: socially  . Drug use: No  . Sexual activity: Yes    Birth control/ protection: Surgical   Other Topics Concern  . Not on file   Social History Narrative   ** Merged History Encounter **        Review of Systems: Constitutional: Negative Skin: Negative HENT: Negative  Eyes: Negative  Neck: Negative Respiratory: Negative Cardiovascular: Negative Gastrointestinal: Negative Genitourinary: Negative  Musculoskeletal: Negative   Neurological: Negative for Hematological: Negative  Psychiatric/Behavioral: Negative    Objective:   Vitals:   01/04/16 1109 01/04/16 1155  BP: (!) 151/95 (!) 144/92  Pulse: 64 69  Resp: 18   Temp: 97.5 F (36.4 C)     Physical  Exam: Constitutional: Patient appears well-developed and well-nourished. No distress. HENT: Normocephalic, atraumatic, External right and left ear normal. Oropharynx is clear and moist.  Eyes: Conjunctivae and EOM are normal. PERRLA, no scleral icterus. Neck: Normal ROM. Neck supple. No lymphadenopathy, No thyromegaly. CVS: RRR, S1/S2 +, no murmurs, no gallops, no rubs Pulmonary: Effort and breath sounds normal, no stridor, rhonchi, wheezes, rales.  Abdominal: Soft. Normoactive BS,, no distension, tenderness, rebound or guarding.  Musculoskeletal: Normal range of motion. No edema and no tenderness.  Neuro: Alert.Normal muscle tone coordination. Non-focal Skin: Skin is warm and dry. No rash noted. Not diaphoretic. No erythema. No pallor. Psychiatric: Normal mood and affect. Behavior, judgment, thought content normal.  Lab Results  Component Value Date   WBC 6.7 01/04/2016   HGB 14.5 01/04/2016   HCT 42.8 01/04/2016   MCV 85.3 01/04/2016   PLT 244 01/04/2016   Lab Results  Component Value Date   CREATININE 0.76 01/04/2016   BUN 10 01/04/2016   NA 141 01/04/2016   K 4.3 01/04/2016   CL 105 01/04/2016   CO2 28 01/04/2016    Lab Results  Component Value Date   HGBA1C 5.6 01/04/2016   Lipid Panel     Component Value Date/Time   CHOL 242 (H) 05/26/2015 0926   TRIG 142 05/26/2015 0926   HDL 74 05/26/2015 0926   CHOLHDL 3.3 05/26/2015 0926   VLDL 28 05/26/2015 0926   LDLCALC 140 (H) 05/26/2015 0926        Assessment and plan:   1. Elevated BP w/o diagnosis of hypertension -BP by nurse in 2 weeks.  2. Health care maintenance  - Tdap vaccine greater than or equal to 7yo IM - Hemoglobin A1c - COMPLETE METABOLIC PANEL WITH GFR - CBC with Differential  3. Uncomplicated asthma, unspecified asthma severity, unspecified whether persistent  - albuterol-ipratropium (COMBIVENT) 18-103 MCG/ACT inhaler; Inhale 2 puffs into the lungs every 4 (four) hours as needed for wheezing  or shortness of breath.  Dispense: 1 Inhaler; Refill: 3  4. Hypothyroidism, unspecified type  - levothyroxine (SYNTHROID, LEVOTHROID) 75 MCG tablet; TAKE 1 TABLET BY MOUTH DAILY BEFORE BREAKFAST.  Dispense: 90 tablet; Refill: 1   No Follow-up on file.  The patient was given clear instructions to go to ER or return to medical center if symptoms don't improve, worsen or new problems develop. The patient verbalized understanding.    Micheline Chapman FNP  01/08/2016, 10:46 AM

## 2016-01-08 NOTE — Patient Instructions (Signed)
Return in 2 weeks for BP check with nurse.

## 2016-01-23 ENCOUNTER — Ambulatory Visit (INDEPENDENT_AMBULATORY_CARE_PROVIDER_SITE_OTHER): Payer: No Typology Code available for payment source

## 2016-01-23 DIAGNOSIS — R030 Elevated blood-pressure reading, without diagnosis of hypertension: Secondary | ICD-10-CM

## 2016-01-23 NOTE — Progress Notes (Signed)
Patient is here for BP CHECK  Patient has taken medication today. Patient has eaten today.

## 2016-01-23 NOTE — Patient Instructions (Addendum)
Thank you for visiting the clinic today. Patient is aware of no changes being made and to FU in 3 months for her Thyroid.

## 2016-03-01 MED FILL — LEVOTHYROXINE 75 MCG TABLET: 75 | 30 days supply | Qty: 30 | Fill #0

## 2016-04-15 ENCOUNTER — Ambulatory Visit: Payer: Self-pay | Admitting: Family Medicine

## 2016-04-15 MED FILL — LEVOTHYROXINE 75 MCG TABLET: 75 | 30 days supply | Qty: 30 | Fill #1

## 2016-04-16 ENCOUNTER — Ambulatory Visit: Payer: Self-pay | Admitting: Family Medicine

## 2016-04-23 ENCOUNTER — Ambulatory Visit: Payer: Self-pay | Admitting: Family Medicine

## 2016-05-21 MED FILL — LEVOTHYROXINE 75 MCG TABLET: 75 | 30 days supply | Qty: 30 | Fill #2

## 2016-05-29 ENCOUNTER — Ambulatory Visit: Payer: Self-pay | Admitting: Family Medicine

## 2016-06-05 ENCOUNTER — Ambulatory Visit: Payer: Self-pay | Admitting: Family Medicine

## 2016-06-12 ENCOUNTER — Ambulatory Visit: Payer: Self-pay | Admitting: Family Medicine

## 2016-06-19 ENCOUNTER — Ambulatory Visit: Payer: Self-pay | Admitting: Family Medicine

## 2016-06-24 MED FILL — LEVOTHYROXINE 75 MCG TABLET: 75 | 30 days supply | Qty: 30 | Fill #3

## 2016-06-26 ENCOUNTER — Ambulatory Visit: Payer: Self-pay | Admitting: Family Medicine

## 2016-07-02 ENCOUNTER — Other Ambulatory Visit: Payer: Self-pay | Admitting: Obstetrics and Gynecology

## 2016-07-02 DIAGNOSIS — Z1231 Encounter for screening mammogram for malignant neoplasm of breast: Secondary | ICD-10-CM

## 2016-07-10 ENCOUNTER — Ambulatory Visit: Payer: Self-pay | Admitting: Family Medicine

## 2016-07-18 ENCOUNTER — Ambulatory Visit (HOSPITAL_COMMUNITY): Payer: Self-pay

## 2016-07-19 ENCOUNTER — Emergency Department (HOSPITAL_BASED_OUTPATIENT_CLINIC_OR_DEPARTMENT_OTHER)
Admission: EM | Admit: 2016-07-19 | Discharge: 2016-07-19 | Disposition: A | Discharge status: Home / Self Care | Payer: No Typology Code available for payment source | Attending: Emergency Medicine | Admitting: Emergency Medicine

## 2016-07-19 ENCOUNTER — Encounter (HOSPITAL_BASED_OUTPATIENT_CLINIC_OR_DEPARTMENT_OTHER): Payer: Self-pay | Admitting: Emergency Medicine

## 2016-07-19 ENCOUNTER — Emergency Department (HOSPITAL_BASED_OUTPATIENT_CLINIC_OR_DEPARTMENT_OTHER): Payer: No Typology Code available for payment source

## 2016-07-19 DIAGNOSIS — E039 Hypothyroidism, unspecified: Secondary | ICD-10-CM | POA: Insufficient documentation

## 2016-07-19 DIAGNOSIS — M545 Low back pain, unspecified: Secondary | ICD-10-CM

## 2016-07-19 DIAGNOSIS — Z79899 Other long term (current) drug therapy: Secondary | ICD-10-CM | POA: Insufficient documentation

## 2016-07-19 DIAGNOSIS — F17210 Nicotine dependence, cigarettes, uncomplicated: Secondary | ICD-10-CM | POA: Insufficient documentation

## 2016-07-19 DIAGNOSIS — J45909 Unspecified asthma, uncomplicated: Secondary | ICD-10-CM | POA: Insufficient documentation

## 2016-07-19 DIAGNOSIS — R1030 Lower abdominal pain, unspecified: Secondary | ICD-10-CM

## 2016-07-19 LAB — URINALYSIS, ROUTINE W REFLEX MICROSCOPIC
Bilirubin Urine: NEGATIVE
GLUCOSE, UA: NEGATIVE mg/dL
Hgb urine dipstick: NEGATIVE
KETONES UR: NEGATIVE mg/dL
LEUKOCYTES UA: NEGATIVE
NITRITE: NEGATIVE
Protein, ur: NEGATIVE mg/dL
Specific Gravity, Urine: 1.006 (ref 1.005–1.030)
pH: 6 (ref 5.0–8.0)

## 2016-07-19 IMAGING — CT CT RENAL STONE PROTOCOL
2 of 4 series · 17 of 46 positions shown, 19 images · non-contrast
Comparison: [DATE]

CLINICAL DATA: Bilateral flank pain for 1 week

EXAM:
CT ABDOMEN AND PELVIS WITHOUT CONTRAST
TECHNIQUE: Multidetector CT imaging of the abdomen and pelvis was performed
following the standard protocol without IV contrast.

[Series 2: axial st · axial · 0.98mm/px · z∈[+686,+1112]mm · 14 of 93 slices shown, 16 images]
[im 4/93  soft-tissue]
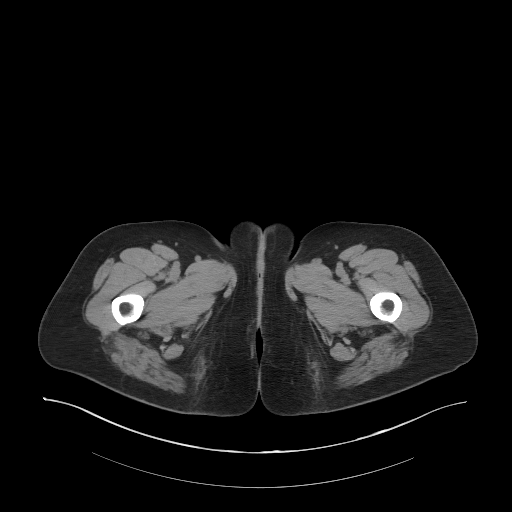
[im 4/93  bone]
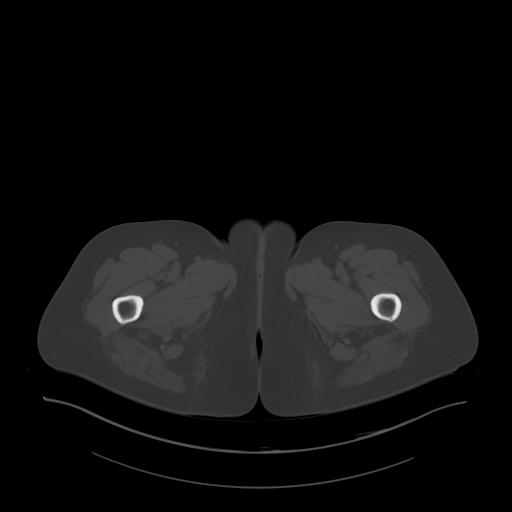
[im 12/93  soft-tissue]
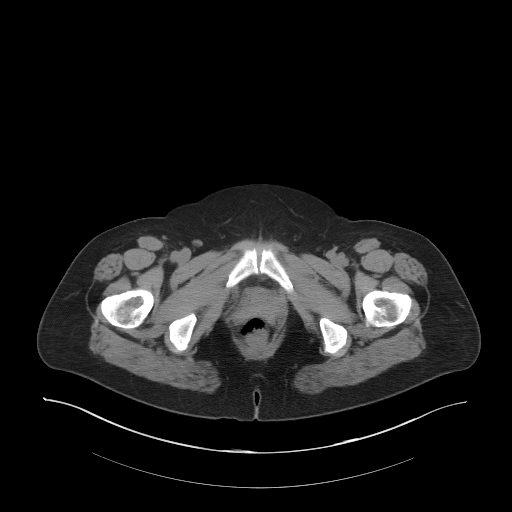
[im 19/93  soft-tissue]
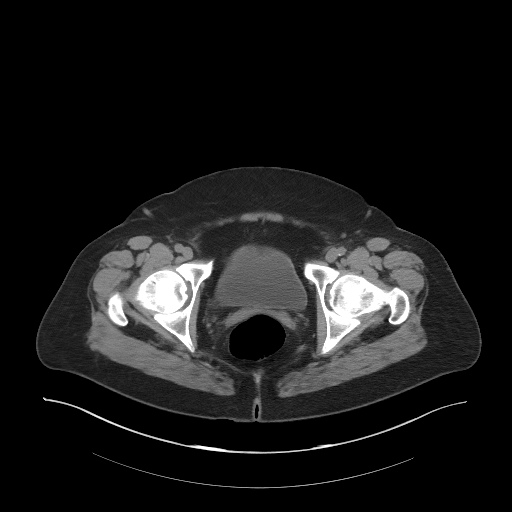
[im 26/93  soft-tissue]
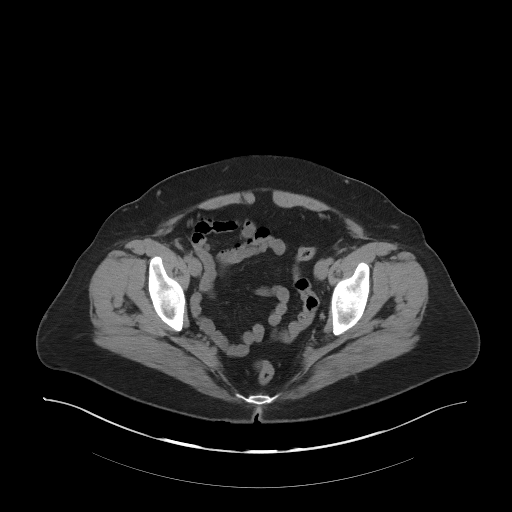
[im 30/93  soft-tissue]
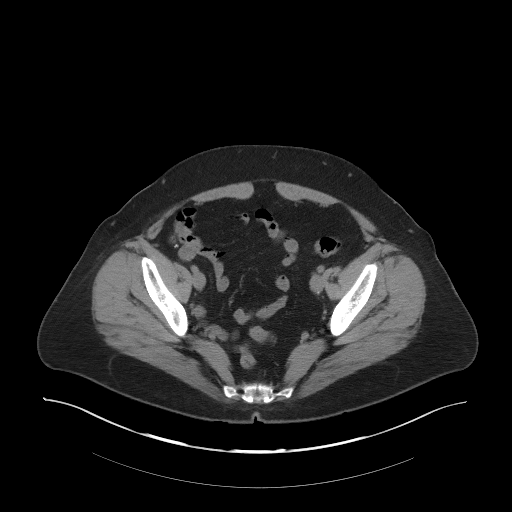
[im 37/93  soft-tissue]
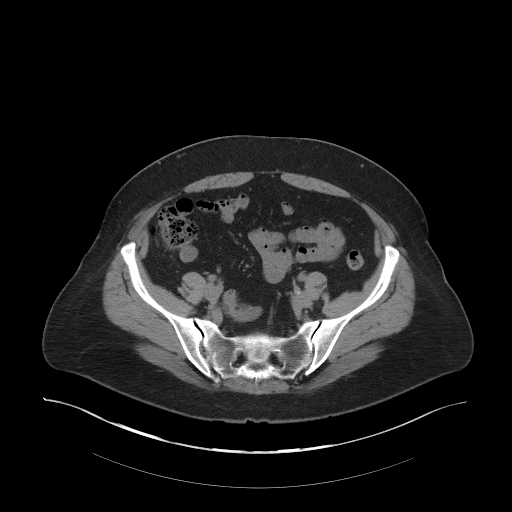
[im 45/93  soft-tissue]
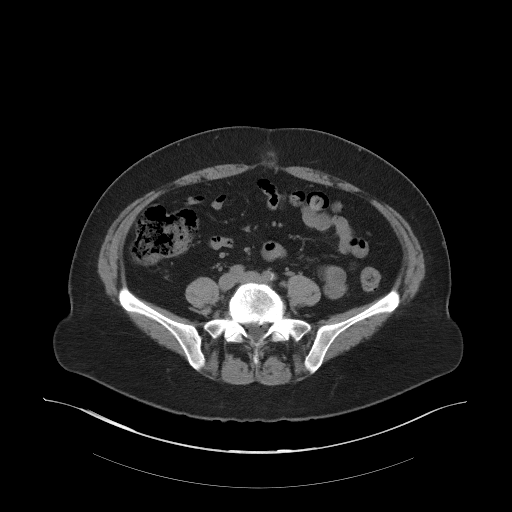
[im 48/93  soft-tissue]
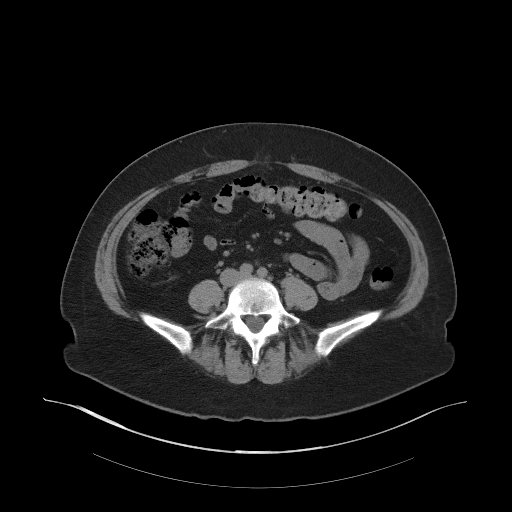
[im 56/93  soft-tissue]
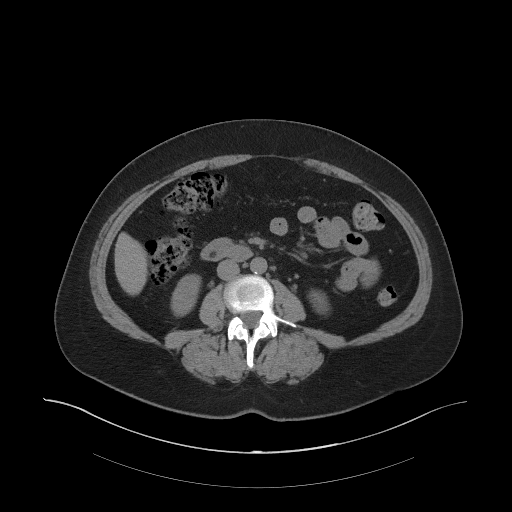
[im 56/93  bone]
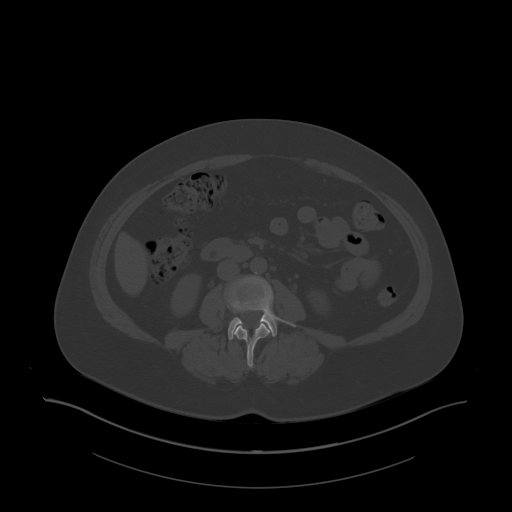
[im 63/93  soft-tissue]
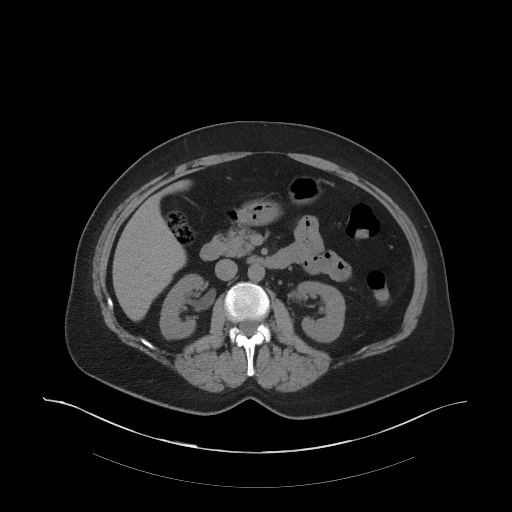
[im 70/93  soft-tissue]
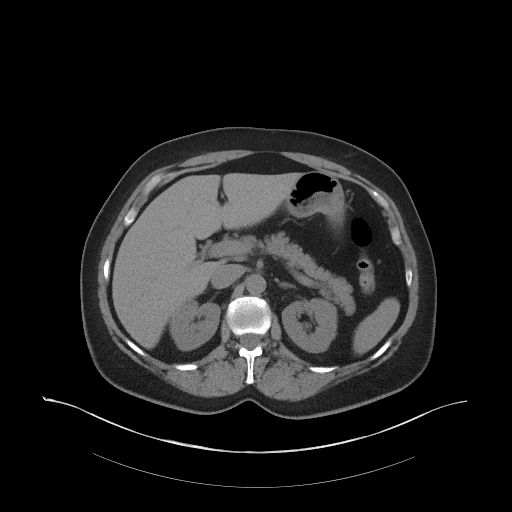
[im 74/93  soft-tissue]
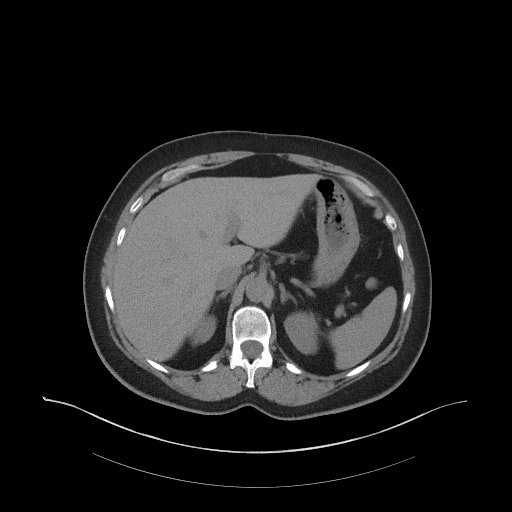
[im 81/93  soft-tissue]
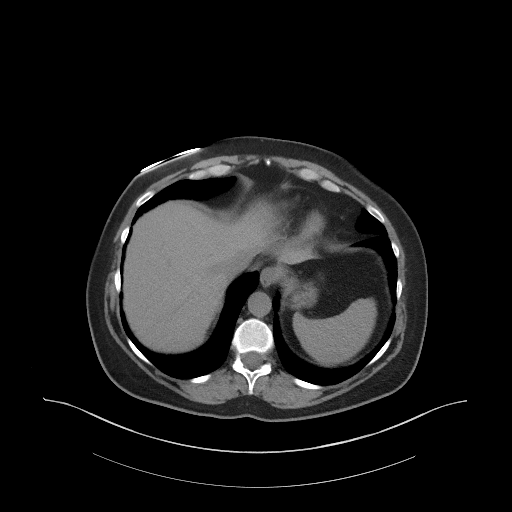
[im 89/93  soft-tissue]
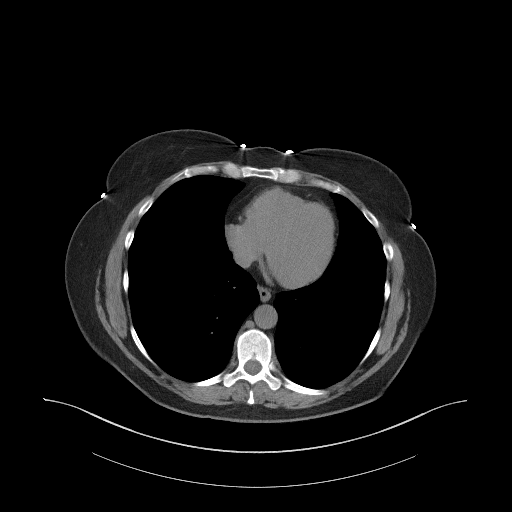

[Series 5: coronal st · coronal · 0.95mm/px · 3 of 93 slices shown]
[im 31/93  soft-tissue]
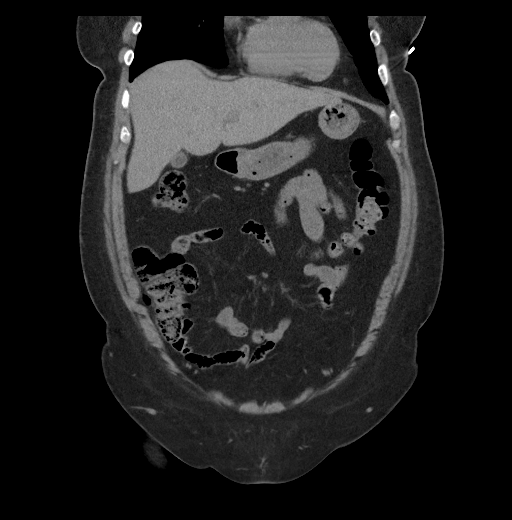
[im 41/93  soft-tissue]
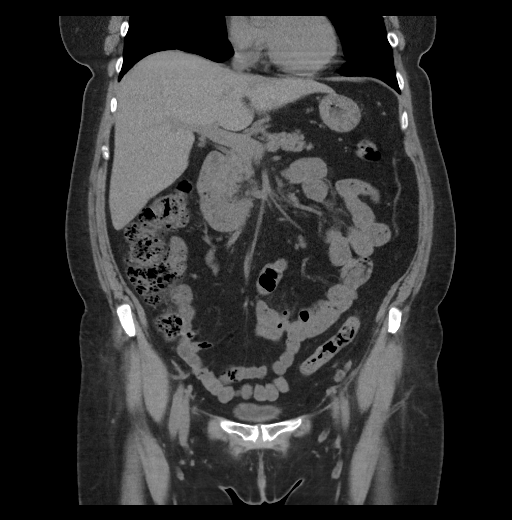
[im 52/93  soft-tissue]
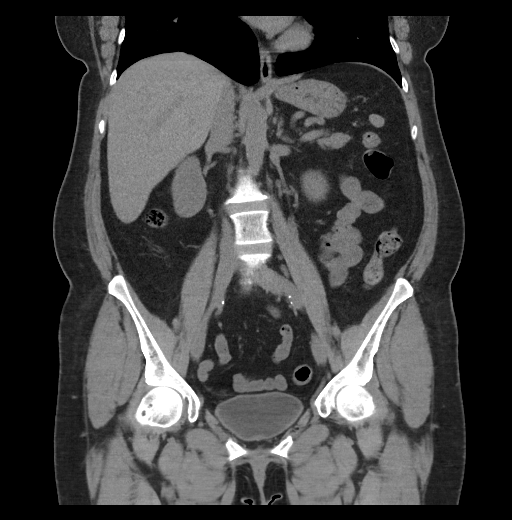

[17 of 46 positions shown; findings below may reference images not displayed]

FINDINGS: Lower chest: No acute abnormality.

Hepatobiliary: No focal liver abnormality is seen. No gallstones,
gallbladder wall thickening, or biliary dilatation.

Pancreas: Unremarkable. No pancreatic ductal dilatation or
surrounding inflammatory changes.

Spleen: Normal in size without focal abnormality.

Adrenals/Urinary Tract: The adrenal glands are within normal limits
bilaterally. No renal calculi or obstructive changes are seen.
Bladder is partially distended.

Stomach/Bowel: The appendix has been surgically removed. No
inflammatory or obstructive changes of the bowel are seen.

Vascular/Lymphatic: Aortic atherosclerosis. No enlarged abdominal or
pelvic lymph nodes.

Reproductive: Status post hysterectomy. No adnexal masses.

Other: No abdominal wall hernia or abnormality. No abdominopelvic
ascites.

Musculoskeletal: No acute or significant osseous findings.
IMPRESSION: No renal calculi or obstructive changes.

Postsurgical change.

No acute abnormality noted.

## 2016-07-19 MED ORDER — HYDROCODONE-ACETAMINOPHEN 5-325 MG PO TABS
2.0000 | ORAL_TABLET | Freq: Once | ORAL | Status: AC
  Administered 2016-07-19: 2 via ORAL
  Filled 2016-07-19: qty 2

## 2016-07-19 MED ORDER — TRAMADOL HCL 50 MG PO TABS: 50.0000 mg | ORAL_TABLET | Freq: Four times a day (QID) | ORAL | 0 refills | Status: DC | PRN

## 2016-07-19 MED ORDER — DICYCLOMINE HCL 20 MG PO TABS: 20.0000 mg | ORAL_TABLET | Freq: Two times a day (BID) | ORAL | 0 refills | Status: DC

## 2016-07-19 NOTE — ED Triage Notes (Signed)
PT presents to ED with c/o lower back pain and lower abdominal pain that started about a week ago but has gotten much worse.

## 2016-07-19 NOTE — ED Provider Notes (Signed)
[facility] Provider Note   CSN: [id number] Arrival date & time: 07/19/16  1946  By signing my name below, I, Collene Leyden, attest that this documentation has been prepared under the direction and in the presence of Charlesetta Shanks, MD. Electronically Signed: Collene Leyden, Scribe. 07/19/16. 8:57 PM.   History   Chief Complaint Chief Complaint  Patient presents with  . Abdominal Pain  . Back Pain    HPI Comments: Jamie Mercado is a 56 y.o. female with no pertinent past medical history, who presents to the Emergency Department complaining of sudden-onset, constant bilateral lower back pain that began one week ago. Patient states she has had kidney stones in the past, in which her symptoms feel similar. Patient reports associated pain radiation into the abdomen, subjective fever, and urinary frequency. No medications taken prior to arrival. Patient denies any new increased activity. Surgical history of an appendectomy and hysterectomy. Patient denies any dysuria, vaginal bleeding, vaginal discharge, lower extremity pain, chills, nausea, vomiting, shortness of breath, or chest pain.   The history is provided by the patient. No language interpreter was used.    Past Medical History:  Diagnosis Date  . Asthma   . Hypothyroidism     Patient Active Problem List   Diagnosis Date Noted  . Elevated BP without diagnosis of hypertension 01/23/2016  . Generalized anxiety disorder 05/26/2015  . Insomnia 05/26/2015    Past Surgical History:  Procedure Laterality Date  . ABDOMINAL HYSTERECTOMY    . APPENDECTOMY    . endometrosis    . KNEE SURGERY    . TONSILLECTOMY    . TUBAL LIGATION      OB History    Gravida Para Term Preterm AB Living   2 2 2  0 0     SAB TAB Ectopic Multiple Live Births   0 0 0           Home Medications    Prior to Admission medications   Medication Sig Start Date End Date Taking? Authorizing Provider  acetaminophen (TYLENOL) 500 MG  tablet Take 1,000 mg by mouth every 8 (eight) hours as needed for mild pain, moderate pain or headache.    [provider]  albuterol (PROVENTIL HFA;VENTOLIN HFA) 108 (90 Base) MCG/ACT inhaler Inhale 1-2 puffs into the lungs every 6 (six) hours as needed for wheezing. 05/26/15   Micheline Chapman, NP  albuterol-ipratropium (COMBIVENT) 18-103 MCG/ACT inhaler Inhale 2 puffs into the lungs every 4 (four) hours as needed for wheezing or shortness of breath. 01/04/16   Micheline Chapman, NP  benzonatate (TESSALON) 100 MG capsule Take 1 capsule (100 mg total) by mouth 2 (two) times daily as needed for cough. 01/04/16   Micheline Chapman, NP  dicyclomine (BENTYL) 20 MG tablet Take 1 tablet (20 mg total) by mouth 2 (two) times daily. 07/19/16   Charlesetta Shanks, MD  diphenhydrAMINE (BENADRYL) 25 MG tablet Take 25 mg by mouth every 8 (eight) hours as needed for allergies. Reported on 05/26/2015    [provider]  escitalopram (LEXAPRO) 10 MG tablet Take 10 mg by mouth daily. Reported on 08/29/2015    [provider]  ibuprofen (ADVIL,MOTRIN) 200 MG tablet Take 600 mg by mouth every 6 (six) hours as needed for pain.    [provider]  levothyroxine (SYNTHROID, LEVOTHROID) 75 MCG tablet TAKE 1 TABLET BY MOUTH DAILY BEFORE BREAKFAST. 01/04/16   Micheline Chapman, NP  mometasone-formoterol (DULERA) 100-5 MCG/ACT AERO Inhale 2 puffs into  the lungs 2 (two) times daily. 01/04/16   Micheline Chapman, NP  nicotine (NICODERM CQ) 14 mg/24hr patch Place 1 patch (14 mg total) onto the skin daily. Patient not taking: Reported on 01/04/2016 08/29/15   Micheline Chapman, NP  ondansetron (ZOFRAN) 4 MG tablet Take 1 tablet (4 mg total) by mouth every 6 (six) hours. Patient not taking: Reported on 01/04/2016 07/31/12   Piepenbrink, Anderson Malta, PA-C  polyethylene glycol powder (GLYCOLAX/MIRALAX) powder Take 17 g by mouth daily. Until daily soft stools  OTC Patient not taking: Reported on  01/04/2016 07/31/12   Piepenbrink, Anderson Malta, PA-C  ranitidine (ZANTAC) 75 MG tablet Take 75 mg by mouth daily as needed for heartburn.    [provider]  temazepam (RESTORIL) 15 MG capsule Take 1 capsule (15 mg total) by mouth at bedtime as needed for sleep. Patient not taking: Reported on 01/04/2016 08/29/15   Micheline Chapman, NP  traMADol (ULTRAM) 50 MG tablet Take 1 tablet (50 mg total) by mouth every 6 (six) hours as needed. 07/19/16   Charlesetta Shanks, MD  traZODone (DESYREL) 50 MG tablet Take 1 tablet (50 mg total) by mouth at bedtime as needed for sleep. Patient not taking: Reported on 01/04/2016 05/26/15   Micheline Chapman, NP    Family History Family History  Problem Relation Age of Onset  . Diabetes Father   . Heart disease Father   . Hypertension Father   . Cancer Paternal Grandmother     Social History Social History  Substance Use Topics  . Smoking status: Current Every Day Smoker    Packs/day: 0.50    Types: Cigarettes  . Smokeless tobacco: Never Used  . Alcohol use Yes     Comment: socially     Allergies   Codeine and Codeine   Review of Systems Review of Systems  A complete 10 system review of systems was obtained and all systems are negative except as noted in the HPI and PMH.   Physical Exam Updated Vital Signs BP 137/88 (BP Location: Right Arm)   Pulse 67   Temp 98.5 F (36.9 C) (Oral)   Resp 14   SpO2 93%   Physical Exam  Constitutional: She is oriented to person, place, and time. She appears well-developed and well-nourished.  HENT:  Head: Normocephalic and atraumatic.  Eyes: EOM are normal. Pupils are equal, round, and reactive to light.  Neck: Normal range of motion. Neck supple.  Cardiovascular: Normal rate and regular rhythm.   Pulmonary/Chest: Effort normal and breath sounds normal.  Abdominal: Soft. Bowel sounds are normal. She exhibits no distension and no mass. There is tenderness. There is no rebound and no guarding.  Left  CVA tenderness. Left lateral quadrant tenderness. Diffuse lower abdominal tenderness. No guarding or palpable mass.   Musculoskeletal: Normal range of motion.  Neurological: She is alert and oriented to person, place, and time.  Skin: Skin is warm and dry.  Psychiatric: She has a normal mood and affect.  Nursing note and vitals reviewed.    ED Treatments / Results  DIAGNOSTIC STUDIES: Oxygen Saturation is 98% on RA, normal by my interpretation.    COORDINATION OF CARE: 8:56 PM Discussed treatment plan with pt at bedside and pt agreed to plan, which includes imaging.   Labs (all labs ordered are listed, but only abnormal results are displayed) Labs Reviewed  URINALYSIS, ROUTINE W REFLEX MICROSCOPIC    EKG  EKG Interpretation None       Radiology Ct  Renal Stone Study  Result Date: 07/19/2016 CLINICAL DATA:  Bilateral flank pain for 1 week EXAM: CT ABDOMEN AND PELVIS WITHOUT CONTRAST TECHNIQUE: Multidetector CT imaging of the abdomen and pelvis was performed following the standard protocol without IV contrast. COMPARISON:  07/31/2012 FINDINGS: Lower chest: No acute abnormality. Hepatobiliary: No focal liver abnormality is seen. No gallstones, gallbladder wall thickening, or biliary dilatation. Pancreas: Unremarkable. No pancreatic ductal dilatation or surrounding inflammatory changes. Spleen: Normal in size without focal abnormality. Adrenals/Urinary Tract: The adrenal glands are within normal limits bilaterally. No renal calculi or obstructive changes are seen. Bladder is partially distended. Stomach/Bowel: The appendix has been surgically removed. No inflammatory or obstructive changes of the bowel are seen. Vascular/Lymphatic: Aortic atherosclerosis. No enlarged abdominal or pelvic lymph nodes. Reproductive: Status post hysterectomy. No adnexal masses. Other: No abdominal wall hernia or abnormality. No abdominopelvic ascites. Musculoskeletal: No acute or significant osseous findings.  IMPRESSION: No renal calculi or obstructive changes. Postsurgical change. No acute abnormality noted. Electronically Signed   By: Inez Catalina M.D.   On: 07/19/2016 21:35    Procedures Procedures (including critical care time)  Medications Ordered in ED Medications  HYDROcodone-acetaminophen (NORCO/VICODIN) 5-325 MG per tablet 2 tablet (2 tablets Oral Given 07/19/16 2153)     Initial Impression / Assessment and Plan / ED Course  I have reviewed the triage vital signs and the nursing notes.  Pertinent labs & imaging results that were available during my care of the patient were reviewed by me and considered in my medical decision making (see chart for details).     Final Clinical Impressions(s) / ED Diagnoses   Final diagnoses:  Acute bilateral low back pain without sciatica  Lower abdominal pain  Patient felt the symptoms are very much like prior kidney stones and was concern for possible UTI. Patient clinically well and appearance. She however has developed lower back pain which is worse to the left. Also she has diffuse lower abdominal pain that radiates around from the sides. Patient felt that the symptoms were like prior kidney stones. Generally symptoms are somewhat atypical in that it seems to be more bilateral although she did favor left CVA tenderness. Also she has diffuse lower abdominal pain. CT does not show any evidence of stone or inflammatory changes within the bowel. Patient has had complete hysterectomy. The pain seems to have some colicky spasmodic quality. At this time as there are no other findings, the patient is clinically well, plan will be to treat for pain with Bentyl for spasms and tramadol for back pain. There is no associated neurologic symptoms or radiation to the legs. Patient will follow-up with her PCP midweek.  New Prescriptions New Prescriptions   DICYCLOMINE (BENTYL) 20 MG TABLET    Take 1 tablet (20 mg total) by mouth 2 (two) times daily.   TRAMADOL  (ULTRAM) 50 MG TABLET    Take 1 tablet (50 mg total) by mouth every 6 (six) hours as needed.      Charlesetta Shanks, MD 07/19/16 [phone]

## 2016-07-23 ENCOUNTER — Telehealth: Payer: Self-pay

## 2016-07-23 ENCOUNTER — Encounter: Payer: Self-pay | Admitting: Family Medicine

## 2016-07-23 ENCOUNTER — Ambulatory Visit (INDEPENDENT_AMBULATORY_CARE_PROVIDER_SITE_OTHER): Payer: Self-pay | Admitting: Family Medicine

## 2016-07-23 VITALS — BP 128/87 | HR 84 | Temp 97.4°F | Resp 16 | Ht 64.0 in | Wt 173.8 lb

## 2016-07-23 DIAGNOSIS — F411 Generalized anxiety disorder: Secondary | ICD-10-CM

## 2016-07-23 DIAGNOSIS — E039 Hypothyroidism, unspecified: Secondary | ICD-10-CM

## 2016-07-23 DIAGNOSIS — G4700 Insomnia, unspecified: Secondary | ICD-10-CM

## 2016-07-23 DIAGNOSIS — L409 Psoriasis, unspecified: Secondary | ICD-10-CM

## 2016-07-23 LAB — CBC WITH DIFFERENTIAL/PLATELET
BASOS ABS: 0 {cells}/uL (ref 0–200)
Basophils Relative: 0 %
EOS ABS: 462 {cells}/uL (ref 15–500)
Eosinophils Relative: 6 %
HEMATOCRIT: 45.9 % — AB (ref 35.0–45.0)
HEMOGLOBIN: 15.1 g/dL (ref 11.7–15.5)
LYMPHS ABS: 2464 {cells}/uL (ref 850–3900)
Lymphocytes Relative: 32 %
MCH: 28.2 pg (ref 27.0–33.0)
MCHC: 32.9 g/dL (ref 32.0–36.0)
MCV: 85.6 fL (ref 80.0–100.0)
MPV: 10.6 fL (ref 7.5–12.5)
Monocytes Absolute: 616 cells/uL (ref 200–950)
Monocytes Relative: 8 %
NEUTROS ABS: 4158 {cells}/uL (ref 1500–7800)
Neutrophils Relative %: 54 %
Platelets: 257 10*3/uL (ref 140–400)
RBC: 5.36 MIL/uL — ABNORMAL HIGH (ref 3.80–5.10)
RDW: 14.5 % (ref 11.0–15.0)
WBC: 7.7 10*3/uL (ref 3.8–10.8)

## 2016-07-23 LAB — POCT URINALYSIS DIP (DEVICE)
Bilirubin Urine: NEGATIVE
GLUCOSE, UA: NEGATIVE mg/dL
Hgb urine dipstick: NEGATIVE
Leukocytes, UA: NEGATIVE
NITRITE: NEGATIVE
Protein, ur: NEGATIVE mg/dL
Specific Gravity, Urine: >=1.03 (ref 1.005–1.030)
UROBILINOGEN UA: 0.2 mg/dL (ref 0.0–1.0)
pH: 5 (ref 5.0–8.0)

## 2016-07-23 MED ORDER — TRIAMCINOLONE ACETONIDE 0.025 % EX OINT: 1.0000 "application " | TOPICAL_OINTMENT | Freq: Two times a day (BID) | CUTANEOUS | 0 refills | Status: DC

## 2016-07-23 MED ORDER — PREDNISONE 20 MG PO TABS: 20.0000 mg | ORAL_TABLET | Freq: Every day | ORAL | 0 refills | Status: DC

## 2016-07-23 MED ORDER — LEVOTHYROXINE SODIUM 75 MCG PO TABS: ORAL_TABLET | ORAL | 1 refills | Status: DC

## 2016-07-23 MED ORDER — BUSPIRONE HCL 10 MG PO TABS: 10.0000 mg | ORAL_TABLET | Freq: Three times a day (TID) | ORAL | 0 refills | Status: DC

## 2016-07-23 MED ORDER — ESCITALOPRAM OXALATE 20 MG PO TABS: 20.0000 mg | ORAL_TABLET | Freq: Every day | ORAL | 1 refills | Status: DC

## 2016-07-23 NOTE — Patient Instructions (Addendum)
For your anxiety I have sent over Buspirone 10 mg. You may take this medication 2-3 times daily for anxiety.  For anxiety, I resuming your escitalopram 20 mg at bedtime and take this with 50 mg of benadryl at night to induce sleep.   For psoriasis, take prednisone 20 mg with breakfast daily for 7 days and apply triamcinolone cream to affected areas twice daily.

## 2016-07-23 NOTE — Progress Notes (Signed)
Patient ID: Jamie Mercado, female    DOB: 07-Dec-1960, 56 y.o.   MRN: [id number]  PCP: Scot Jun, FNP  Chief Complaint  Patient presents with  . Follow-up    thyroid  . Insomnia    Subjective:  HPI  Jamie Mercado is a 56 y.o. female presents for thyroid follow-up and to discuss insomnia.  Active problem list and current medications reviewed.  Hypothyroidism Last TSH measured 11 months prior and was within normal range although was upper level of normal at  4.18. Reports compliance with Levothyroxine 75 mcg. Concerned today as she feel her neck has increased in size. She has never received a neck ultrasound is rather concerned due to noticeable enlargement.   Insomnia, Anxiety, and Depression Jamie Mercado has suffered chronically from insomnia associated with anxiety. She has major depression also. Reports that she was previously prescribed Xanax which was effective in achieving sleep in the past. She was also previously prescribed escitalopram which she discontinued taking. She was on a low dose Previously.  Psoriasis  Chronic skin itching and dryness. Previously prescribed steroidal cream which improved symptoms. At present she reports skin eruptions all over the arms and legs.  Social History   Social History  . Marital status: Legally Separated    Spouse name: N/A  . Number of children: N/A  . Years of education: N/A   Occupational History  . Not on file.   Social History Main Topics  . Smoking status: Current Every Day Smoker    Packs/day: 0.50    Types: Cigarettes  . Smokeless tobacco: Never Used  . Alcohol use Yes     Comment: socially  . Drug use: No  . Sexual activity: Yes    Birth control/ protection: Surgical   Other Topics Concern  . Not on file   Social History Narrative   ** Merged History Encounter **        Family History  Problem Relation Age of Onset  . Diabetes Father   . Heart disease Father   . Hypertension Father   .  Cancer Paternal Grandmother    Review of Systems See HPI  Patient Active Problem List   Diagnosis Date Noted  . Elevated BP without diagnosis of hypertension 01/23/2016  . Generalized anxiety disorder 05/26/2015  . Insomnia 05/26/2015    Allergies  Allergen Reactions  . Codeine Itching and Nausea And Vomiting  . Codeine Itching, Nausea And Vomiting and Other (See Comments)    Makes her feel wierd    Prior to Admission medications   Medication Sig Start Date End Date Taking? Authorizing Provider  acetaminophen (TYLENOL) 500 MG tablet Take 1,000 mg by mouth every 8 (eight) hours as needed for mild pain, moderate pain or headache.   Yes [provider]  albuterol (PROVENTIL HFA;VENTOLIN HFA) 108 (90 Base) MCG/ACT inhaler Inhale 1-2 puffs into the lungs every 6 (six) hours as needed for wheezing. 05/26/15  Yes Micheline Chapman, NP  albuterol-ipratropium (COMBIVENT) 18-103 MCG/ACT inhaler Inhale 2 puffs into the lungs every 4 (four) hours as needed for wheezing or shortness of breath. 01/04/16  Yes Micheline Chapman, NP  dicyclomine (BENTYL) 20 MG tablet Take 1 tablet (20 mg total) by mouth 2 (two) times daily. 07/19/16  Yes Charlesetta Shanks, MD  escitalopram (LEXAPRO) 10 MG tablet Take 10 mg by mouth daily. Reported on 08/29/2015   Yes [provider]  ibuprofen (ADVIL,MOTRIN) 200 MG tablet Take 600 mg by mouth every 6 (six) hours  as needed for pain.   Yes [provider]  levothyroxine (SYNTHROID, LEVOTHROID) 75 MCG tablet TAKE 1 TABLET BY MOUTH DAILY BEFORE BREAKFAST. 01/04/16  Yes Micheline Chapman, NP  mometasone-formoterol (DULERA) 100-5 MCG/ACT AERO Inhale 2 puffs into the lungs 2 (two) times daily. 01/04/16  Yes Micheline Chapman, NP  nicotine (NICODERM CQ) 14 mg/24hr patch Place 1 patch (14 mg total) onto the skin daily. 08/29/15  Yes Micheline Chapman, NP  ranitidine (ZANTAC) 75 MG tablet Take 75 mg by mouth daily as needed for heartburn.   Yes [provider]  temazepam (RESTORIL) 15 MG capsule Take 1 capsule (15 mg total) by mouth at bedtime as needed for sleep. 08/29/15  Yes Micheline Chapman, NP  traMADol (ULTRAM) 50 MG tablet Take 1 tablet (50 mg total) by mouth every 6 (six) hours as needed. 07/19/16  Yes Charlesetta Shanks, MD  diphenhydrAMINE (BENADRYL) 25 MG tablet Take 25 mg by mouth every 8 (eight) hours as needed for allergies. Reported on 05/26/2015    [provider]  ondansetron (ZOFRAN) 4 MG tablet Take 1 tablet (4 mg total) by mouth every 6 (six) hours. Patient not taking: Reported on 07/23/2016 07/31/12   Piepenbrink, Anderson Malta, PA-C  traZODone (DESYREL) 50 MG tablet Take 1 tablet (50 mg total) by mouth at bedtime as needed for sleep. Patient not taking: Reported on 07/23/2016 05/26/15   Micheline Chapman, NP    Past Medical, Surgical Family and Social History reviewed and updated.    Objective:   Today's Vitals   07/23/16 1527  BP: 128/87  Pulse: 84  Resp: 16  Temp: 97.4 F (36.3 C)  TempSrc: Oral  SpO2: 98%  Weight: 173 lb 12.8 oz (78.8 kg)  Height: 5\' 4"  (1.626 m)    Wt Readings from Last 3 Encounters:  07/23/16 173 lb 12.8 oz (78.8 kg)  01/04/16 187 lb (84.8 kg)  08/29/15 182 lb (82.6 kg)    Physical Exam  Constitutional: She is oriented to person, place, and time. She appears well-developed and well-nourished.  HENT:  Head: Normocephalic and atraumatic.  Eyes: Conjunctivae and EOM are normal. Pupils are equal, round, and reactive to light.  Neck: Normal range of motion.  Cardiovascular: Normal rate, regular rhythm, normal heart sounds and intact distal pulses.   Pulmonary/Chest: Effort normal and breath sounds normal.  Musculoskeletal: Normal range of motion.  Neurological: She is alert and oriented to person, place, and time.  Skin: Skin is warm, dry and intact. Rash noted. Rash is macular. There is erythema.  Psychiatric: Her behavior is normal. Judgment and thought content normal. Her mood  appears anxious. Her speech is rapid and/or pressured.      Assessment & Plan:  1. Hypothyroidism, unspecified type - COMPLETE METABOLIC PANEL WITH GFR - CBC with Differential - Thyroid Panel With TSH - Continue Levothyroxine (SYNTHROID, LEVOTHROID) 75 MCG tablet;  - US Soft Tissue Head/Neck; Future  2. Generalized anxiety disorder -Buspirone 10 mg, up to 3 times daily  -Escitalopram 20 mg at bedtime with  3. Insomnia, unspecified type  -Benadryl 50 mg to induce sleep.  4. Psoriasis (a type of skin inflammation) -Prednisone 20 mg with breakfast daily for 7 days -Triamcinolone Cream applied twice daily to affected areas of skin.   RTC: 6 weeks for anxiety and depression follow-up   Carroll Sage. Kenton Kingfisher, MSN, FNP-C [facility]  [address] [place], [place] [address] [phone]

## 2016-07-24 LAB — COMPLETE METABOLIC PANEL WITH GFR
ALBUMIN: 4.3 g/dL (ref 3.6–5.1)
ALT: 21 U/L (ref 6–29)
AST: 18 U/L (ref 10–35)
Alkaline Phosphatase: 66 U/L (ref 33–130)
BUN: 13 mg/dL (ref 7–25)
CO2: 23 mmol/L (ref 20–31)
Calcium: 9.4 mg/dL (ref 8.6–10.4)
Chloride: 107 mmol/L (ref 98–110)
Creat: 0.91 mg/dL (ref 0.50–1.05)
GFR, EST NON AFRICAN AMERICAN: 72 mL/min (ref >=60)
GFR, Est African American: 83 mL/min (ref >=60)
GLUCOSE: 88 mg/dL (ref 65–99)
POTASSIUM: 4.5 mmol/L (ref 3.5–5.3)
SODIUM: 142 mmol/L (ref 135–146)
TOTAL PROTEIN: 6.7 g/dL (ref 6.1–8.1)
Total Bilirubin: 0.4 mg/dL (ref 0.2–1.2)

## 2016-07-24 LAB — THYROID PANEL WITH TSH
Free Thyroxine Index: 2 (ref 1.4–3.8)
T3 Uptake: 30 % (ref 22–35)
T4, Total: 6.7 ug/dL (ref 4.5–12.0)
TSH: 3.81 m[IU]/L

## 2016-07-24 MED FILL — ?LEVOTHYROXINE 75 MCG TABLE: 75 | 30 days supply | Qty: 90 | Fill #0

## 2016-07-24 MED FILL — ?PREDNISONE 20 MG TABLET: 20 | 7 days supply | Qty: 7 | Fill #0

## 2016-07-24 MED FILL — TRIAMCINOLONE 0.025% OINT: 0.025 | 20 days supply | Qty: 454 | Fill #0

## 2016-07-24 MED FILL — ?ESCITALOPRAM 20 MG TABLET: 20 | 30 days supply | Qty: 30 | Fill #0

## 2016-07-27 DIAGNOSIS — L409 Psoriasis, unspecified: Secondary | ICD-10-CM | POA: Insufficient documentation

## 2016-07-29 ENCOUNTER — Telehealth: Payer: Self-pay

## 2016-07-29 NOTE — Telephone Encounter (Signed)
Left a vm that [place] was scheduled for this Wednesday at 11am at [facility].

## 2016-07-31 ENCOUNTER — Ambulatory Visit (HOSPITAL_COMMUNITY)
Admission: RE | Admit: 2016-07-31 | Discharge: 2016-07-31 | Disposition: A | Discharge status: Home / Self Care | Payer: Self-pay | Source: Ambulatory Visit | Attending: Family Medicine | Admitting: Family Medicine

## 2016-07-31 DIAGNOSIS — E039 Hypothyroidism, unspecified: Secondary | ICD-10-CM | POA: Insufficient documentation

## 2016-08-01 ENCOUNTER — Telehealth: Payer: Self-pay

## 2016-08-01 NOTE — Telephone Encounter (Signed)
Result note sent to your inbox

## 2016-08-20 ENCOUNTER — Ambulatory Visit: Payer: Self-pay | Admitting: Family Medicine

## 2016-08-22 ENCOUNTER — Ambulatory Visit (HOSPITAL_COMMUNITY)
Admission: RE | Admit: 2016-08-22 | Discharge: 2016-08-22 | Disposition: A | Discharge status: Home / Self Care | Payer: Self-pay | Source: Ambulatory Visit | Attending: Obstetrics and Gynecology | Admitting: Obstetrics and Gynecology

## 2016-08-22 ENCOUNTER — Encounter (HOSPITAL_COMMUNITY): Payer: Self-pay

## 2016-08-22 ENCOUNTER — Ambulatory Visit
Admission: RE | Admit: 2016-08-22 | Discharge: 2016-08-22 | Disposition: A | Discharge status: Home / Self Care | Payer: Self-pay | Source: Ambulatory Visit | Attending: Obstetrics and Gynecology | Admitting: Obstetrics and Gynecology

## 2016-08-22 VITALS — BP 118/86 | Ht 65.5 in | Wt 173.0 lb

## 2016-08-22 DIAGNOSIS — Z1239 Encounter for other screening for malignant neoplasm of breast: Secondary | ICD-10-CM

## 2016-08-22 DIAGNOSIS — Z1231 Encounter for screening mammogram for malignant neoplasm of breast: Secondary | ICD-10-CM

## 2016-08-22 NOTE — Progress Notes (Signed)
No complaints today.   Pap Smear: Pap smear not completed today. Last Pap smear was 6 years ago at [facility] and normal per patient. Per patient has a history of an abnormal Pap smear when she was 56 years old that a [facility] was completed for follow up. Patient stated all Pap smears have been normal since [facility]. 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 Patient no longer needs to have Pap smears completed due to her history of a hysterectomy for benign reasons per BCCCP and ACOG guidelines. No Pap smear results are in EPIC.  Physical exam: Breasts Breasts symmetrical. No skin abnormalities bilateral breasts. No nipple retraction bilateral breasts. No nipple discharge bilateral breasts. No lymphadenopathy. No lumps palpated bilateral breasts. No complaints of pain or tenderness on exam. Referred patient to the [facility] for a screening mammogram. Appointment scheduled for Thursday, August 22, 2016 at 1510.        Pelvic/Bimanual No Pap smear completed today since patient has a history of a hysterectomy for benign reasons. Pap smear not indicated per BCCCP guidelines.   Smoking History: Patient is a current smoker.Smoking cessation discussed with patient. Referred patient to the [facility] Quitline and gave resources to free smoking cessation classes offered at [facility].   Patient Navigation: Patient education provided. Access to services provided for patient through [facility] program.   Colorectal Cancer Screening: Per patient had a colonoscopy completed two years ago. Patient stated her next one is due in two years due polyps being found. No complaints today. FIT Test given to patient to complete and return to BCCCP.

## 2016-08-22 NOTE — Patient Instructions (Addendum)
Explained breast self awareness with [name]. Patient did not need a Pap smear today due to her history of a hysterectomy for benign reasons. Informed patient that she doesn't need any further Pap smears due to her history of a hysterectomy for benign reasons. Referred patient to the [facility] for a screening mammogram. Appointment scheduled for Thursday, August 22, 2016 at 1510. Smoking cessation discussed with patient. Referred patient to the [facility] Quitline and gave resources to free smoking cessation classes offered at [facility]. Let patient know the [facility] will follow up with her within the next couple weeks with results of mammogram by letter or phone. Jamie Mercado verbalized understanding.  [name], Arvil Chaco, RN 2:50 PM

## 2016-08-23 ENCOUNTER — Encounter (HOSPITAL_COMMUNITY): Payer: Self-pay | Admitting: *Deleted

## 2016-08-26 ENCOUNTER — Other Ambulatory Visit: Payer: Self-pay

## 2016-08-29 LAB — PLEASE NOTE

## 2016-08-29 LAB — FECAL OCCULT BLOOD, IMMUNOCHEMICAL: Fecal Occult Bld: NEGATIVE

## 2016-08-30 ENCOUNTER — Encounter (HOSPITAL_COMMUNITY): Payer: Self-pay | Admitting: *Deleted

## 2016-08-30 NOTE — Progress Notes (Signed)
Letter mailed to patient with normal Fit Test results.

## 2016-09-18 ENCOUNTER — Telehealth: Payer: Self-pay

## 2016-09-18 NOTE — Telephone Encounter (Signed)
Left a vm for patient to callback 

## 2016-09-18 NOTE — Telephone Encounter (Signed)
Patient will try to get in within the next few weeks

## 2016-09-18 NOTE — Telephone Encounter (Signed)
Advise patient that it's been greater than 1 month since she has been seen in office or had labs. I verbally would not be able to advise or not advise plasma donation. She can schedule an office visit for further evaluation.

## 2016-11-12 MED FILL — LEVOTHYROXINE 75 MCG TABLET: 75 | 90 days supply | Qty: 90 | Fill #1

## 2017-01-19 ENCOUNTER — Emergency Department
Admission: EM | Admit: 2017-01-19 | Discharge: 2017-01-19 | Disposition: A | Discharge status: Home / Self Care | Payer: 59 | Source: Home / Self Care | Attending: Family Medicine | Admitting: Family Medicine

## 2017-01-19 ENCOUNTER — Encounter: Payer: Self-pay | Admitting: Emergency Medicine

## 2017-01-19 DIAGNOSIS — B9689 Other specified bacterial agents as the cause of diseases classified elsewhere: Secondary | ICD-10-CM | POA: Diagnosis not present

## 2017-01-19 DIAGNOSIS — J019 Acute sinusitis, unspecified: Secondary | ICD-10-CM

## 2017-01-19 MED ORDER — IPRATROPIUM BROMIDE 0.06 % NA SOLN: 2.0000 | Freq: Four times a day (QID) | NASAL | 1 refills | Status: DC

## 2017-01-19 MED ORDER — AMOXICILLIN 500 MG PO CAPS: 500.0000 mg | ORAL_CAPSULE | Freq: Three times a day (TID) | ORAL | 0 refills | Status: DC

## 2017-01-19 NOTE — ED Provider Notes (Signed)
[name]    CSN: [id number] Arrival date & time: 01/19/17  1318     History   Chief Complaint Chief Complaint  Patient presents with  . Nasal Congestion  . Fatigue    HPI Jamie Mercado is a 56 y.o. female.   HPI Jamie Mercado is a 56 y.o. female presenting to UC with c/o 1 week of gradually worsening sinus pressure and pain with nasal congestion and scratchy throat. Minimal cough.  She has tried OTC Goodies powder, generic Flonase, and sinus rinse w/o relief.  Hx of sinus infection 1 other time in her life. Symptoms feel similar. Denies fever, chills, n/v/d but has had fatigue and frontal headache.    Past Medical History:  Diagnosis Date  . Anxiety   . Asthma   . Hypothyroidism     Patient Active Problem List   Diagnosis Date Noted  . Psoriasis (a type of skin inflammation) 07/27/2016  . Elevated BP without diagnosis of hypertension 01/23/2016  . Generalized anxiety disorder 05/26/2015  . Insomnia 05/26/2015    Past Surgical History:  Procedure Laterality Date  . ABDOMINAL HYSTERECTOMY    . APPENDECTOMY    . endometrosis    . KNEE SURGERY    . TONSILLECTOMY    . TUBAL LIGATION      OB History    Gravida Para Term Preterm AB Living   2 2 2  0 0     SAB TAB Ectopic Multiple Live Births   0 0 0           Home Medications    Prior to Admission medications   Medication Sig Start Date End Date Taking? Authorizing Provider  acetaminophen (TYLENOL) 500 MG tablet Take 1,000 mg by mouth every 8 (eight) hours as needed for mild pain, moderate pain or headache.    [provider]  albuterol (PROVENTIL HFA;VENTOLIN HFA) 108 (90 Base) MCG/ACT inhaler Inhale 1-2 puffs into the lungs every 6 (six) hours as needed for wheezing. 05/26/15   Micheline Chapman, NP  albuterol-ipratropium (COMBIVENT) 18-103 MCG/ACT inhaler Inhale 2 puffs into the lungs every 4 (four) hours as needed for wheezing or shortness of breath. 01/04/16   Micheline Chapman,  NP  amoxicillin (AMOXIL) 500 MG capsule Take 1 capsule (500 mg total) by mouth 3 (three) times daily. 01/19/17   Noe Gens, PA-C  busPIRone (BUSPAR) 10 MG tablet Take 1 tablet (10 mg total) by mouth 3 (three) times daily. 07/23/16   Scot Jun, FNP  dicyclomine (BENTYL) 20 MG tablet Take 1 tablet (20 mg total) by mouth 2 (two) times daily. Patient not taking: Reported on 08/22/2016 07/19/16   Charlesetta Shanks, MD  diphenhydrAMINE (BENADRYL) 25 MG tablet Take 25 mg by mouth every 8 (eight) hours as needed for allergies. Reported on 05/26/2015    [provider]  escitalopram (LEXAPRO) 20 MG tablet Take 1 tablet (20 mg total) by mouth daily. Reported on 08/29/2015 07/23/16   Scot Jun, FNP  ibuprofen (ADVIL,MOTRIN) 200 MG tablet Take 600 mg by mouth every 6 (six) hours as needed for pain.    [provider]  ipratropium (ATROVENT) 0.06 % nasal spray Place 2 sprays into both nostrils 4 (four) times daily. 01/19/17   Noe Gens, PA-C  levothyroxine (SYNTHROID, LEVOTHROID) 75 MCG tablet TAKE 1 TABLET BY MOUTH DAILY BEFORE BREAKFAST. 07/23/16   Scot Jun, FNP  mometasone-formoterol (DULERA) 100-5 MCG/ACT AERO Inhale 2 puffs into the lungs 2 (two)  times daily. Patient not taking: Reported on 08/22/2016 01/04/16   Micheline Chapman, NP  nicotine (NICODERM CQ) 14 mg/24hr patch Place 1 patch (14 mg total) onto the skin daily. Patient not taking: Reported on 08/22/2016 08/29/15   Micheline Chapman, NP  ondansetron (ZOFRAN) 4 MG tablet Take 1 tablet (4 mg total) by mouth every 6 (six) hours. Patient not taking: Reported on 07/23/2016 07/31/12   Piepenbrink, Anderson Malta, PA-C  predniSONE (DELTASONE) 20 MG tablet Take 1 tablet (20 mg total) by mouth daily with breakfast. Patient not taking: Reported on 08/22/2016 07/23/16   Scot Jun, FNP  ranitidine (ZANTAC) 75 MG tablet Take 75 mg by mouth daily as needed for heartburn.    [provider]  traMADol (ULTRAM) 50 MG  tablet Take 1 tablet (50 mg total) by mouth every 6 (six) hours as needed. Patient not taking: Reported on 08/22/2016 07/19/16   Charlesetta Shanks, MD  traZODone (DESYREL) 50 MG tablet Take 1 tablet (50 mg total) by mouth at bedtime as needed for sleep. Patient not taking: Reported on 07/23/2016 05/26/15   Micheline Chapman, NP  triamcinolone (KENALOG) 0.025 % ointment Apply 1 application topically 2 (two) times daily. 07/23/16   Scot Jun, FNP    Family History Family History  Problem Relation Age of Onset  . Diabetes Father   . Heart disease Father   . Hypertension Father   . Cancer Paternal Grandmother     Social History Social History   Tobacco Use  . Smoking status: Current Every Day Smoker    Packs/day: 0.50    Types: Cigarettes  . Smokeless tobacco: Never Used  Substance Use Topics  . Alcohol use: Yes    Comment: socially  . Drug use: No     Allergies   Codeine   Review of Systems Review of Systems  Constitutional: Positive for fatigue. Negative for chills and fever.  HENT: Positive for congestion, ear pain ( pressure), postnasal drip, rhinorrhea, sinus pressure, sinus pain and sore throat. Negative for trouble swallowing and voice change.   Respiratory: Positive for cough. Negative for shortness of breath.   Cardiovascular: Negative for chest pain and palpitations.  Gastrointestinal: Negative for abdominal pain, diarrhea, nausea and vomiting.  Musculoskeletal: Negative for arthralgias, back pain and myalgias.  Skin: Negative for rash.  Neurological: Positive for headaches. Negative for dizziness and light-headedness.     Physical Exam Triage Vital Signs ED Triage Vitals [01/19/17 1353]  Enc Vitals Group     BP 123/87     Pulse Rate 79     Resp 16     Temp 97.8 F (36.6 C)     Temp Source Oral     SpO2 99 %     Weight 175 lb (79.4 kg)     Height 5' 5.5" (1.664 m)     Head Circumference      Peak Flow      Pain Score 2     Pain Loc      Pain Edu?        Excl. in [place]?    No data found.  Updated Vital Signs BP 123/87 (BP Location: Right Arm)   Pulse 79   Temp 97.8 F (36.6 C) (Oral)   Resp 16   Ht 5' 5.5" (1.664 m)   Wt 175 lb (79.4 kg)   SpO2 99%   BMI 28.68 kg/m   Visual Acuity Right Eye Distance:   Left Eye Distance:  Bilateral Distance:    Right Eye Near:   Left Eye Near:    Bilateral Near:     Physical Exam  Constitutional: She is oriented to person, place, and time. She appears well-developed and well-nourished. No distress.  HENT:  Head: Normocephalic and atraumatic.  Right Ear: Tympanic membrane normal.  Left Ear: Tympanic membrane normal.  Nose: Mucosal edema present. Right sinus exhibits maxillary sinus tenderness and frontal sinus tenderness. Left sinus exhibits maxillary sinus tenderness and frontal sinus tenderness.  Mouth/Throat: Uvula is midline, oropharynx is clear and moist and mucous membranes are normal.  Eyes: EOM are normal.  Neck: Normal range of motion. Neck supple.  Cardiovascular: Normal rate and regular rhythm.  Pulmonary/Chest: Effort normal and breath sounds normal. No stridor. No respiratory distress. She has no wheezes. She has no rales.  Musculoskeletal: Normal range of motion.  Lymphadenopathy:    She has no cervical adenopathy.  Neurological: She is alert and oriented to person, place, and time.  Skin: Skin is warm and dry. She is not diaphoretic.  Psychiatric: She has a normal mood and affect. Her behavior is normal.  Nursing note and vitals reviewed.    UC Treatments / Results  Labs (all labs ordered are listed, but only abnormal results are displayed) Labs Reviewed - No data to display  EKG  EKG Interpretation None       Radiology No results found.  Procedures Procedures (including critical care time)  Medications Ordered in UC Medications - No data to display   Initial Impression / Assessment and Plan / UC Course  I have reviewed the triage vital signs and  the nursing notes.  Pertinent labs & imaging results that were available during my care of the patient were reviewed by me and considered in my medical decision making (see chart for details).     Hx and exam c/w bacterial sinusitis given sinus tenderness on exam Home care instructions provided F/u with PCP in 1 week if not improving.   Final Clinical Impressions(s) / UC Diagnoses   Final diagnoses:  Acute bacterial rhinosinusitis    ED Discharge Orders        Ordered    amoxicillin (AMOXIL) 500 MG capsule  3 times daily     01/19/17 1415    ipratropium (ATROVENT) 0.06 % nasal spray  4 times daily     01/19/17 1415       Controlled Substance Prescriptions  Controlled Substance Registry consulted? Not Applicable   Tyrell Antonio 01/19/17 1504

## 2017-01-19 NOTE — ED Triage Notes (Signed)
Reports congestion, sinus pressure and fatigue for past week; taking Goodie's powder and using other OTCs without relief.

## 2017-01-19 NOTE — Discharge Instructions (Signed)
°  You may take 500mg acetaminophen every 4-6 hours or in combination with ibuprofen 400-600mg every 6-8 hours as needed for pain, inflammation, and fever. ° °Be sure to drink at least eight 8oz glasses of water to stay well hydrated and get at least 8 hours of sleep at night, preferably more while sick.  ° °Please take antibiotics as prescribed and be sure to complete entire course even if you start to feel better to ensure infection does not come back. ° °

## 2017-02-18 DIAGNOSIS — F1290 Cannabis use, unspecified, uncomplicated: Secondary | ICD-10-CM

## 2017-02-18 HISTORY — DX: Cannabis use, unspecified, uncomplicated: F12.90

## 2017-02-19 ENCOUNTER — Telehealth: Payer: Self-pay

## 2017-02-19 DIAGNOSIS — E039 Hypothyroidism, unspecified: Secondary | ICD-10-CM

## 2017-02-19 MED ORDER — LEVOTHYROXINE SODIUM 75 MCG PO TABS: ORAL_TABLET | ORAL | 0 refills | Status: DC

## 2017-02-19 NOTE — Telephone Encounter (Signed)
Medication sent for 30 days and message sent that patient needs follow up appointment

## 2017-02-20 ENCOUNTER — Ambulatory Visit: Payer: Self-pay | Admitting: Family Medicine

## 2017-02-20 MED ORDER — LEVOTHYROXINE SODIUM 75 MCG PO TABS: ORAL_TABLET | ORAL | 0 refills | Status: DC

## 2017-02-20 NOTE — Telephone Encounter (Signed)
Patient is establishing at another practice according to appointment scheduled at [place]. If re-established elsewhere, will no longer refill medications.

## 2017-02-27 ENCOUNTER — Ambulatory Visit (INDEPENDENT_AMBULATORY_CARE_PROVIDER_SITE_OTHER): Payer: 59 | Admitting: Family Medicine

## 2017-02-27 ENCOUNTER — Encounter: Payer: Self-pay | Admitting: Family Medicine

## 2017-02-27 VITALS — BP 131/85 | HR 68 | Temp 97.9°F | Resp 16 | Ht 66.0 in | Wt 188.0 lb

## 2017-02-27 DIAGNOSIS — Z79899 Other long term (current) drug therapy: Secondary | ICD-10-CM | POA: Diagnosis not present

## 2017-02-27 DIAGNOSIS — E039 Hypothyroidism, unspecified: Secondary | ICD-10-CM

## 2017-02-27 DIAGNOSIS — N951 Menopausal and female climacteric states: Secondary | ICD-10-CM | POA: Diagnosis not present

## 2017-02-27 LAB — CBC WITH DIFFERENTIAL/PLATELET
BASOS ABS: 0.1 10*3/uL (ref 0.0–0.1)
Basophils Relative: 0.7 % (ref 0.0–3.0)
EOS ABS: 0.3 10*3/uL (ref 0.0–0.7)
Eosinophils Relative: 3.8 % (ref 0.0–5.0)
HCT: 43.1 % (ref 36.0–46.0)
Hemoglobin: 14.4 g/dL (ref 12.0–15.0)
LYMPHS ABS: 2.3 10*3/uL (ref 0.7–4.0)
Lymphocytes Relative: 32.5 % (ref 12.0–46.0)
MCHC: 33.4 g/dL (ref 30.0–36.0)
MCV: 85 fl (ref 78.0–100.0)
Monocytes Absolute: 0.4 10*3/uL (ref 0.1–1.0)
Monocytes Relative: 5.3 % (ref 3.0–12.0)
NEUTROS ABS: 4.1 10*3/uL (ref 1.4–7.7)
NEUTROS PCT: 57.7 % (ref 43.0–77.0)
PLATELETS: 261 10*3/uL (ref 150.0–400.0)
RBC: 5.07 Mil/uL (ref 3.87–5.11)
RDW: 14.1 % (ref 11.5–15.5)
WBC: 7.2 10*3/uL (ref 4.0–10.5)

## 2017-02-27 LAB — COMPREHENSIVE METABOLIC PANEL
ALT: 21 U/L (ref 0–35)
AST: 14 U/L (ref 0–37)
Albumin: 4.5 g/dL (ref 3.5–5.2)
Alkaline Phosphatase: 60 U/L (ref 39–117)
BILIRUBIN TOTAL: 0.4 mg/dL (ref 0.2–1.2)
BUN: 10 mg/dL (ref 6–23)
CHLORIDE: 104 meq/L (ref 96–112)
CO2: 31 meq/L (ref 19–32)
CREATININE: 0.68 mg/dL (ref 0.40–1.20)
Calcium: 9.7 mg/dL (ref 8.4–10.5)
GFR: 95.34 mL/min (ref >60.00)
GLUCOSE: 95 mg/dL (ref 70–99)
Potassium: 4.3 mEq/L (ref 3.5–5.1)
Sodium: 141 mEq/L (ref 135–145)
Total Protein: 7 g/dL (ref 6.0–8.3)

## 2017-02-27 LAB — TSH: TSH: 8.03 u[IU]/mL — ABNORMAL HIGH (ref 0.35–4.50)

## 2017-02-27 MED ORDER — FLUOXETINE HCL 10 MG PO CAPS: 10.0000 mg | ORAL_CAPSULE | Freq: Every day | ORAL | 0 refills | Status: DC

## 2017-02-27 MED ORDER — ALPRAZOLAM 1 MG PO TABS: 0.5000 mg | ORAL_TABLET | Freq: Two times a day (BID) | ORAL | 0 refills | Status: DC | PRN

## 2017-02-27 NOTE — Progress Notes (Signed)
Office Note 02/27/2017  CC:  Chief Complaint  Patient presents with  . Establish Care    HPI:  Jamie Mercado is a 57 y.o.  female who is here to establish care Patient's most recent primary MD: Molli Barrows, [place]. Old records in [facility] E[facility] reviewed prior to or during today's visit.  She begins by talking a mile a minute, hard to interrupt, continually saying she has lots of "issues" that she needs fixed right away.   Main synopsis: For about the last year, worse the last 6 mo---lots of anxiety/stress with work and kids, having sleep problems, hot flashes, all she wants to do is eat, moody, irritable, decreased libido, fatigued.  She says antidepressants (lexapro is the only one she can recall) "did not agree with me" and says a friend gave her xanax recently and this "made everything better", particularly restful sleep and woke up less stressed and moody/irritable.   Has hx of TAH/BSO around 8 yrs ago for endometriosis.  She is interested in some sort of "hormones" to help her current symptoms. She smokes 1/2-1 pack per day and has done this for decades, says she wants to quit but insists she only wants to try with med assistance (she mentions wellbutrin multiple times). Says she last took 1/2 of a xanax (unknown mg) yesterday.  Denies any other controlled substances taken, no illicit drugs.  Past Medical History:  Diagnosis Date  . Asthma   . GAD (generalized anxiety disorder)   . GERD (gastroesophageal reflux disease)   . Hypothyroidism   . IBS (irritable bowel syndrome)   . Insomnia     Past Surgical History:  Procedure Laterality Date  . ABDOMINAL HYSTERECTOMY    . APPENDECTOMY    . COLONOSCOPY  2015  . endometrosis    . KNEE SURGERY    . TONSILLECTOMY    . TUBAL LIGATION      Family History  Problem Relation Age of Onset  . Diabetes Father   . Heart disease Father   . Hypertension Father   . Early death Father   . Hyperlipidemia Father   . Cancer Paternal  Grandmother   . Hyperlipidemia Mother   . Hypertension Mother   . Arthritis Maternal Grandmother   . Diabetes Maternal Grandmother   . Hypertension Maternal Grandmother   . Hyperlipidemia Maternal Grandmother   . Kidney disease Maternal Grandmother   . Stroke Maternal Grandmother   . Hypertension Maternal Grandfather   . Hypertension Paternal Grandfather     Social History   Socioeconomic History  . Marital status: Legally Separated    Spouse name: Not on file  . Number of children: Not on file  . Years of education: Not on file  . Highest education level: Not on file  Social Needs  . Financial resource strain: Not on file  . Food insecurity - worry: Not on file  . Food insecurity - inability: Not on file  . Transportation needs - medical: Not on file  . Transportation needs - non-medical: Not on file  Occupational History  . Not on file  Tobacco Use  . Smoking status: Current Every Day Smoker    Packs/day: 0.50    Types: Cigarettes  . Smokeless tobacco: Never Used  Substance and Sexual Activity  . Alcohol use: Yes    Comment: socially  . Drug use: No  . Sexual activity: Yes    Birth control/protection: Surgical  Other Topics Concern  . Not on file  Social History  Narrative   ** Merged History Encounter **        Outpatient Encounter Medications as of 02/27/2017  Medication Sig  . acetaminophen (TYLENOL) 500 MG tablet Take 1,000 mg by mouth every 8 (eight) hours as needed for mild pain, moderate pain or headache.  . albuterol (PROVENTIL HFA;VENTOLIN HFA) 108 (90 Base) MCG/ACT inhaler Inhale 1-2 puffs into the lungs every 6 (six) hours as needed for wheezing.  Marland Kitchen ibuprofen (ADVIL,MOTRIN) 200 MG tablet Take 600 mg by mouth every 6 (six) hours as needed for pain.  Marland Kitchen ipratropium (ATROVENT) 0.06 % nasal spray Place 2 sprays into both nostrils 4 (four) times daily.  Marland Kitchen levothyroxine (SYNTHROID, LEVOTHROID) 75 MCG tablet TAKE 1 TABLET BY MOUTH DAILY BEFORE BREAKFAST.  .  ranitidine (ZANTAC) 75 MG tablet Take 75 mg by mouth daily as needed for heartburn.  . triamcinolone (KENALOG) 0.025 % ointment Apply 1 application topically 2 (two) times daily.  Marland Kitchen albuterol-ipratropium (COMBIVENT) 18-103 MCG/ACT inhaler Inhale 2 puffs into the lungs every 4 (four) hours as needed for wheezing or shortness of breath. (Patient not taking: Reported on 02/27/2017)  . busPIRone (BUSPAR) 10 MG tablet Take 1 tablet (10 mg total) by mouth 3 (three) times daily. (Patient not taking: Reported on 02/27/2017)  . dicyclomine (BENTYL) 20 MG tablet Take 1 tablet (20 mg total) by mouth 2 (two) times daily. (Patient not taking: Reported on 08/22/2016)  . diphenhydrAMINE (BENADRYL) 25 MG tablet Take 25 mg by mouth every 8 (eight) hours as needed for allergies. Reported on 05/26/2015  . escitalopram (LEXAPRO) 20 MG tablet Take 1 tablet (20 mg total) by mouth daily. Reported on 08/29/2015 (Patient not taking: Reported on 02/27/2017)  . mometasone-formoterol (DULERA) 100-5 MCG/ACT AERO Inhale 2 puffs into the lungs 2 (two) times daily. (Patient not taking: Reported on 08/22/2016)  . nicotine (NICODERM CQ) 14 mg/24hr patch Place 1 patch (14 mg total) onto the skin daily. (Patient not taking: Reported on 08/22/2016)  . ondansetron (ZOFRAN) 4 MG tablet Take 1 tablet (4 mg total) by mouth every 6 (six) hours. (Patient not taking: Reported on 07/23/2016)  . traMADol (ULTRAM) 50 MG tablet Take 1 tablet (50 mg total) by mouth every 6 (six) hours as needed. (Patient not taking: Reported on 08/22/2016)  . traZODone (DESYREL) 50 MG tablet Take 1 tablet (50 mg total) by mouth at bedtime as needed for sleep. (Patient not taking: Reported on 07/23/2016)  . [DISCONTINUED] amoxicillin (AMOXIL) 500 MG capsule Take 1 capsule (500 mg total) by mouth 3 (three) times daily.  . [DISCONTINUED] predniSONE (DELTASONE) 20 MG tablet Take 1 tablet (20 mg total) by mouth daily with breakfast. (Patient not taking: Reported on 08/22/2016)   No  facility-administered encounter medications on file as of 02/27/2017.     Allergies  Allergen Reactions  . Codeine Itching, Nausea And Vomiting and Other (See Comments)    Makes her feel wierd    ROS Review of Systems  Constitutional: Positive for fatigue. Negative for fever.  HENT: Negative for congestion and sore throat.   Eyes: Negative for visual disturbance.  Respiratory: Negative for cough.   Cardiovascular: Negative for chest pain.  Gastrointestinal: Negative for abdominal pain and nausea.  Genitourinary: Negative for dysuria.  Musculoskeletal: Negative for back pain and joint swelling.  Skin: Negative for rash.  Neurological: Negative for weakness and headaches.  Hematological: Negative for adenopathy.  Psychiatric/Behavioral:       See HPI    PE; Blood pressure 131/85, pulse 68, temperature  97.9 F (36.6 C), temperature source Oral, resp. rate 16, height 5\' 6"  (1.676 m), weight 188 lb (85.3 kg), SpO2 97 %. Body mass index is 30.34 kg/m. Exam chaperoned by Starla Link, CMA.  Gen: Alert, well appearing.  Patient is oriented to person, place, time, and situation. AFFECT: pleasant, lucid thought and speech.  Anxious and high strung. JQB:HALP: no injection, icteris, swelling, or exudate.  EOMI, PERRLA. Mouth: lips without lesion/swelling.  Oral mucosa pink and moist. Oropharynx without erythema, exudate, or swelling.  Neck - No masses or thyromegaly or limitation in range of motion CV: RRR, no m/r/g.   LUNGS: CTA bilat, nonlabored resps, good aeration in all lung fields. ABD: soft, NT/ND EXT: no clubbing, cyanosis, or edema.    Pertinent labs:  Lab Results  Component Value Date   TSH 3.81 07/23/2016   Lab Results  Component Value Date   WBC 7.7 07/23/2016   HGB 15.1 07/23/2016   HCT 45.9 (H) 07/23/2016   MCV 85.6 07/23/2016   PLT 257 07/23/2016   Lab Results  Component Value Date   CREATININE 0.91 07/23/2016   BUN 13 07/23/2016   NA 142 07/23/2016   K 4.5  07/23/2016   CL 107 07/23/2016   CO2 23 07/23/2016   Lab Results  Component Value Date   ALT 21 07/23/2016   AST 18 07/23/2016   ALKPHOS 66 07/23/2016   BILITOT 0.4 07/23/2016   Lab Results  Component Value Date   CHOL 242 (H) 05/26/2015   Lab Results  Component Value Date   HDL 74 05/26/2015   Lab Results  Component Value Date   LDLCALC 140 (H) 05/26/2015   Lab Results  Component Value Date   TRIG 142 05/26/2015   Lab Results  Component Value Date   CHOLHDL 3.3 05/26/2015   Lab Results  Component Value Date   HGBA1C 5.6 01/04/2016    ASSESSMENT AND PLAN:   New pt; will try to obtain records of her colonoscopy.  1) Menopausal syndrome, with significant life stressors and poor coping. Minimal signs of depression.  I told her that hormone replacement was contraindicated in her situation (age > 27 + smoker).  Decided to do trial of fluoxetine 10mg  qd to see if it helps with her multitude of sx's, will likely have to increase dose if she tolerates 10mg  dose well.   I did rx alprazolam 1mg , 1/2-1 bid prn, #30 (2 week supply), no RF. Urine tox screen today: may have alpraz in it but should not have any other controlled substance in it. Encouraged tobacco cessation but it looks like we'll have to try med assistance at some point in future after we get some of her current psych issues improved. Check CBC and CMET today.  2) Hypothyroidism: check TSH today.  Spent 30 min with pt today, with >50% of this time spent in counseling and care coordination regarding the above problems.  An After Visit Summary was printed and given to the patient.  F/u 2 weeks.  Signed:  Crissie Sickles, MD           02/27/2017

## 2017-02-28 ENCOUNTER — Other Ambulatory Visit: Payer: Self-pay | Admitting: *Deleted

## 2017-02-28 ENCOUNTER — Other Ambulatory Visit: Payer: Self-pay | Admitting: Family Medicine

## 2017-02-28 ENCOUNTER — Encounter: Payer: Self-pay | Admitting: *Deleted

## 2017-02-28 DIAGNOSIS — E039 Hypothyroidism, unspecified: Secondary | ICD-10-CM

## 2017-02-28 MED ORDER — LEVOTHYROXINE SODIUM 75 MCG PO TABS: ORAL_TABLET | ORAL | 1 refills | Status: DC

## 2017-03-05 LAB — PAIN MGMT, PROFILE 8 W/CONF, U
6 Acetylmorphine: NEGATIVE ng/mL (ref <10)
ALPHAHYDROXYALPRAZOLAM: 34 ng/mL — AB (ref <25)
ALPHAHYDROXYMIDAZOLAM: NEGATIVE ng/mL (ref <50)
Alcohol Metabolites: NEGATIVE ng/mL (ref <500)
Alphahydroxytriazolam: NEGATIVE ng/mL (ref <50)
Aminoclonazepam: NEGATIVE ng/mL (ref <25)
Amphetamines: NEGATIVE ng/mL (ref <500)
BUPRENORPHINE, URINE: NEGATIVE ng/mL (ref <5)
Benzodiazepines: POSITIVE ng/mL — AB (ref <100)
COCAINE METABOLITE: NEGATIVE ng/mL (ref <150)
Creatinine: 50.9 mg/dL
HYDROXYETHYLFLURAZEPAM: NEGATIVE ng/mL (ref <50)
Lorazepam: NEGATIVE ng/mL (ref <50)
MARIJUANA METABOLITE: 10 ng/mL — AB (ref <5)
MDMA: NEGATIVE ng/mL (ref <500)
Marijuana Metabolite: POSITIVE ng/mL — AB (ref <20)
NORDIAZEPAM: NEGATIVE ng/mL (ref <50)
OXYCODONE: NEGATIVE ng/mL (ref <100)
Opiates: NEGATIVE ng/mL (ref <100)
Oxazepam: NEGATIVE ng/mL (ref <50)
Oxidant: NEGATIVE ug/mL (ref <200)
Temazepam: NEGATIVE ng/mL (ref <50)
pH: 6.88 (ref 4.5–9.0)

## 2017-03-13 ENCOUNTER — Ambulatory Visit: Payer: Self-pay | Admitting: Family Medicine

## 2017-03-19 ENCOUNTER — Encounter: Payer: Self-pay | Admitting: Family Medicine

## 2017-03-19 ENCOUNTER — Ambulatory Visit: Payer: 59 | Admitting: Family Medicine

## 2017-03-19 VITALS — BP 134/82 | HR 69 | Wt 185.0 lb

## 2017-03-19 DIAGNOSIS — E039 Hypothyroidism, unspecified: Secondary | ICD-10-CM | POA: Diagnosis not present

## 2017-03-19 DIAGNOSIS — F419 Anxiety disorder, unspecified: Secondary | ICD-10-CM

## 2017-03-19 DIAGNOSIS — F411 Generalized anxiety disorder: Secondary | ICD-10-CM | POA: Diagnosis not present

## 2017-03-19 DIAGNOSIS — N951 Menopausal and female climacteric states: Secondary | ICD-10-CM

## 2017-03-19 DIAGNOSIS — Z79899 Other long term (current) drug therapy: Secondary | ICD-10-CM

## 2017-03-19 DIAGNOSIS — F5105 Insomnia due to other mental disorder: Secondary | ICD-10-CM

## 2017-03-19 MED ORDER — FLUOXETINE HCL 10 MG PO CAPS: 10.0000 mg | ORAL_CAPSULE | Freq: Every day | ORAL | 0 refills | Status: DC

## 2017-03-19 MED ORDER — ALPRAZOLAM 1 MG PO TABS: 0.5000 mg | ORAL_TABLET | Freq: Two times a day (BID) | ORAL | 0 refills | Status: DC | PRN

## 2017-03-19 NOTE — Progress Notes (Signed)
OFFICE VISIT  03/21/2017   CC:  Chief Complaint  Patient presents with  . Anxiety    follow up   HPI:    Patient is a 57 y.o. Caucasian female who presents for 3 week f/u menopausal syndrome superimposed on GAD. Started fluoxetine 10mg  qd last visit (non HRT candidate due to being a smoker). She specifically wanted xanax, denied use of illicit substances.  Urine tox screen done that day returned with results showing + benzo (expected) and marijuana metabolites (not expected). She admits she took one puff on a joint about a month ago to see if it would help her sleep. Most recent dose of alpraz was last night.  Pt states NOTHING else should show up on UDS.  She reports feeling much improved.  No problems taking fluoxetine.  Says xanax helps her sleep. Went on [employer], has been walking.  No more chest pains, very minimal hot flashes now.  Anxiety is much improved, says she feels much less "manic" and laughs.  Also, at last visit her labs showed that her TSH was a bit high so we increased her levothyroxine a bit. No probs taking this dose per pt report today.   Past Medical History:  Diagnosis Date  . GAD (generalized anxiety disorder)   . GERD (gastroesophageal reflux disease)    well controlled with ranitidine bid  . Hay fever   . Hypothyroidism   . IBS (irritable bowel syndrome)   . Insomnia   . Moderate persistent asthma    Not taking controller meds b/c she said she improved significantly with cutting back on tobacco use.  . Psoriasis   . Tobacco dependence    ongoing as of 02/2017    Past Surgical History:  Procedure Laterality Date  . APPENDECTOMY    . COLONOSCOPY  2015  . endometrosis     TAH/BSO 2011  . KNEE SURGERY    . TONSILLECTOMY    . TOTAL ABDOMINAL HYSTERECTOMY W/ BILATERAL SALPINGOOPHORECTOMY  2011   Endometriosis  . TUBAL LIGATION      Outpatient Medications Prior to Visit  Medication Sig Dispense Refill  . acetaminophen (TYLENOL) 500 MG tablet  Take 1,000 mg by mouth every 8 (eight) hours as needed for mild pain, moderate pain or headache.    . albuterol (PROVENTIL HFA;VENTOLIN HFA) 108 (90 Base) MCG/ACT inhaler Inhale 1-2 puffs into the lungs every 6 (six) hours as needed for wheezing. 1 Inhaler 0  . ibuprofen (ADVIL,MOTRIN) 200 MG tablet Take 600 mg by mouth every 6 (six) hours as needed for pain.    Marland Kitchen ipratropium (ATROVENT) 0.06 % nasal spray Place 2 sprays into both nostrils 4 (four) times daily. 15 mL 1  . levothyroxine (SYNTHROID, LEVOTHROID) 75 MCG tablet 1 tab po qAM on Mondays through Fridays, and take 1 and 1/2 tabs every Saturday and Sunday.  Take 1/2 hour prior to breakfast and take separate from any vitamins or iron. 34 tablet 1  . ranitidine (ZANTAC) 75 MG tablet Take 75 mg by mouth daily as needed for heartburn.    . triamcinolone (KENALOG) 0.025 % ointment Apply 1 application topically 2 (two) times daily. 454 g 0  . ALPRAZolam (XANAX) 1 MG tablet Take 0.5-1 tablets (0.5-1 mg total) by mouth 2 (two) times daily as needed for anxiety. 30 tablet 0  . FLUoxetine (PROZAC) 10 MG capsule Take 1 capsule (10 mg total) by mouth daily. 30 capsule 0  . diphenhydrAMINE (BENADRYL) 25 MG tablet Take 25 mg  by mouth every 8 (eight) hours as needed for allergies. Reported on 05/26/2015    . nicotine (NICODERM CQ) 14 mg/24hr patch Place 1 patch (14 mg total) onto the skin daily. (Patient not taking: Reported on 08/22/2016) 28 patch 1  . albuterol-ipratropium (COMBIVENT) 18-103 MCG/ACT inhaler Inhale 2 puffs into the lungs every 4 (four) hours as needed for wheezing or shortness of breath. (Patient not taking: Reported on 02/27/2017) 1 Inhaler 3  . busPIRone (BUSPAR) 10 MG tablet Take 1 tablet (10 mg total) by mouth 3 (three) times daily. (Patient not taking: Reported on 02/27/2017) 90 tablet 0  . dicyclomine (BENTYL) 20 MG tablet Take 1 tablet (20 mg total) by mouth 2 (two) times daily. (Patient not taking: Reported on 08/22/2016) 20 tablet 0  .  escitalopram (LEXAPRO) 20 MG tablet Take 1 tablet (20 mg total) by mouth daily. Reported on 08/29/2015 (Patient not taking: Reported on 02/27/2017) 90 tablet 1  . mometasone-formoterol (DULERA) 100-5 MCG/ACT AERO Inhale 2 puffs into the lungs 2 (two) times daily. (Patient not taking: Reported on 08/22/2016) 1 Inhaler 2  . ondansetron (ZOFRAN) 4 MG tablet Take 1 tablet (4 mg total) by mouth every 6 (six) hours. (Patient not taking: Reported on 07/23/2016) 12 tablet 0  . traMADol (ULTRAM) 50 MG tablet Take 1 tablet (50 mg total) by mouth every 6 (six) hours as needed. (Patient not taking: Reported on 08/22/2016) 15 tablet 0  . traZODone (DESYREL) 50 MG tablet Take 1 tablet (50 mg total) by mouth at bedtime as needed for sleep. (Patient not taking: Reported on 07/23/2016) 30 tablet 3   No facility-administered medications prior to visit.     Allergies  Allergen Reactions  . Codeine Itching, Nausea And Vomiting and Other (See Comments)    Makes her feel wierd    ROS As per HPI  PE: Blood pressure 134/82, pulse 69, weight 185 lb (83.9 kg), SpO2 97 %. Gen: Alert, well appearing.  Patient is oriented to person, place, time, and situation. AFFECT: pleasant, lucid thought and speech. No further exam today.  LABS:  Lab Results  Component Value Date   TSH 8.03 (H) 02/27/2017   Lab Results  Component Value Date   WBC 7.2 02/27/2017   HGB 14.4 02/27/2017   HCT 43.1 02/27/2017   MCV 85.0 02/27/2017   PLT 261.0 02/27/2017   Lab Results  Component Value Date   CREATININE 0.68 02/27/2017   BUN 10 02/27/2017   NA 141 02/27/2017   K 4.3 02/27/2017   CL 104 02/27/2017   CO2 31 02/27/2017   Lab Results  Component Value Date   ALT 21 02/27/2017   AST 14 02/27/2017   ALKPHOS 60 02/27/2017   BILITOT 0.4 02/27/2017   Lab Results  Component Value Date   CHOL 242 (H) 05/26/2015   Lab Results  Component Value Date   HDL 74 05/26/2015   Lab Results  Component Value Date   LDLCALC 140 (H)  05/26/2015   Lab Results  Component Value Date   TRIG 142 05/26/2015   Lab Results  Component Value Date   CHOLHDL 3.3 05/26/2015     IMPRESSION AND PLAN:  1) Menopausal syndrome + GAD: signif improved on fluoxetine 10mg  qd x 3 wks. Alprazolam very helpful for anxiety and sleep. Reviewed urine tox screen results with pt---told pt that concurrent use of marijuana and alprazolam is not only potentially dangerous, it is a substance she cannot continue to use or I will not continue  to rx alprazolam. She insists this was a one time recent use (prior to her urine tox screen), says it did not help her anxiety anyway, says she has not used any marijuana since then.  I agreed that if her urine tox screen today shows no illicit substances then I would continue to rx her alprazolam with the stipulation of regular urine tox screening in future and if pos for anything I don't rx/street drugs then I will cease rx'ing any controlled substances for her. She expressed understanding today.  2) Hypothyroidism: overreplaced.  Dose recently changed.  Plan on recheck TSH in about 1 mo: future order already in EMR.  Spent 25 min with pt today, with >50% of this time spent in counseling and care coordination regarding the above problems.  An After Visit Summary was printed and given to the patient.  FOLLOW UP: Return in about 4 weeks (around 04/16/2017) for annual CPE (fasting).  Signed:  Crissie Sickles, MD           03/21/2017

## 2017-03-21 ENCOUNTER — Ambulatory Visit: Payer: Self-pay | Admitting: Family Medicine

## 2017-03-25 LAB — PAIN MGMT, PROFILE 8 W/CONF, U
6 Acetylmorphine: NEGATIVE ng/mL (ref <10)
ALCOHOL METABOLITES: NEGATIVE ng/mL (ref <500)
ALPHAHYDROXYALPRAZOLAM: NEGATIVE ng/mL (ref <25)
Alphahydroxymidazolam: NEGATIVE ng/mL (ref <50)
Alphahydroxytriazolam: NEGATIVE ng/mL (ref <50)
Aminoclonazepam: NEGATIVE ng/mL (ref <25)
Amphetamines: NEGATIVE ng/mL (ref <500)
BENZODIAZEPINES: NEGATIVE ng/mL (ref <100)
Buprenorphine, Urine: NEGATIVE ng/mL (ref <5)
COCAINE METABOLITE: NEGATIVE ng/mL (ref <150)
Creatinine: 14 mg/dL — ABNORMAL LOW
HYDROXYETHYLFLURAZEPAM: NEGATIVE ng/mL (ref <50)
LORAZEPAM: NEGATIVE ng/mL (ref <50)
MARIJUANA METABOLITE: NEGATIVE ng/mL (ref <20)
MDMA: NEGATIVE ng/mL (ref <500)
Nordiazepam: NEGATIVE ng/mL (ref <50)
Opiates: NEGATIVE ng/mL (ref <100)
Oxazepam: NEGATIVE ng/mL (ref <50)
Oxidant: NEGATIVE ug/mL (ref <200)
Oxycodone: NEGATIVE ng/mL (ref <100)
Specific Gravity: 1.003 (ref >=1.0)
Temazepam: NEGATIVE ng/mL (ref <50)
pH: 6.19 (ref 4.5–9.0)

## 2017-04-23 ENCOUNTER — Encounter: Payer: Self-pay | Admitting: Family Medicine

## 2017-04-24 ENCOUNTER — Telehealth: Payer: Self-pay | Admitting: Family Medicine

## 2017-04-24 NOTE — Telephone Encounter (Signed)
RF request for alprazolam. Last OV 03/19/17 Next OV 04/29/17 Last RX dated 03/19/17 # 30 x 1 RF  Please advise.

## 2017-04-24 NOTE — Telephone Encounter (Signed)
Copied from [place] [id number]. Topic: Quick Communication - Rx Refill/Question >> Apr 24, 2017  9:59 AM Antonieta Iba C wrote: Medication: ALPRAZolam Duanne Moron) 1 MG tablet   Has the patient contacted their pharmacy? No -    (Agent: If no, request that the patient contact the pharmacy for the refill.)   Preferred Pharmacy (with phone number or street name): [facility]    Agent: Please be advised that RX refills may take up to 3 business days. We ask that you follow-up with your pharmacy.

## 2017-04-24 NOTE — Telephone Encounter (Signed)
Alprazolam 1 mg   LOV: 03/19/17   PCP: [place]: verified

## 2017-04-24 NOTE — Telephone Encounter (Signed)
I am going to deny this RF. Pls notify pt that I'll discuss any ongoing use of this med with her at upcoming office visit. I have to discuss the findings of her last urine drug screen with her at that time. -thx

## 2017-04-24 NOTE — Telephone Encounter (Signed)
Patient called in again for her Alprazolam prescription.  She was told that the doctor denied it and he wanted to discuss it with her on her next office visit.  Patient said that would be fine.

## 2017-04-25 NOTE — Telephone Encounter (Signed)
Noted  

## 2017-04-29 ENCOUNTER — Encounter: Payer: Self-pay | Admitting: Family Medicine

## 2017-05-07 ENCOUNTER — Ambulatory Visit (INDEPENDENT_AMBULATORY_CARE_PROVIDER_SITE_OTHER): Payer: 59 | Admitting: Family Medicine

## 2017-05-07 ENCOUNTER — Encounter: Payer: Self-pay | Admitting: Family Medicine

## 2017-05-07 VITALS — BP 130/86 | HR 66 | Temp 97.8°F | Ht 65.0 in | Wt 186.6 lb

## 2017-05-07 DIAGNOSIS — E039 Hypothyroidism, unspecified: Secondary | ICD-10-CM | POA: Diagnosis not present

## 2017-05-07 DIAGNOSIS — M2011 Hallux valgus (acquired), right foot: Secondary | ICD-10-CM | POA: Diagnosis not present

## 2017-05-07 DIAGNOSIS — Z0000 Encounter for general adult medical examination without abnormal findings: Secondary | ICD-10-CM

## 2017-05-07 DIAGNOSIS — F411 Generalized anxiety disorder: Secondary | ICD-10-CM

## 2017-05-07 MED ORDER — FLUOXETINE HCL 20 MG PO CAPS: 20.0000 mg | ORAL_CAPSULE | Freq: Every day | ORAL | 1 refills | Status: DC

## 2017-05-07 NOTE — Progress Notes (Signed)
OFFICE VISIT  05/07/2017   CC:  Chief Complaint  Patient presents with  . Annual Exam    CPE   HPI:    Patient is a 57 y.o. Caucasian female who presents for annual health maintenance exam. Says she has quit smoking--no cigs in 1 week.  Anxiety with sleep issues still a problem.  Unfortunately I had to tell her today that I won't rx any controlled substances for her anymore b/c of 2 failed drug screens (on 02/18/17 she had +benzos as appropriate but also + marijuana, and on 03/19/17 she had NEG benzos and + marijuana).  She continues to act incredulous that her UDS's would have this result. She is open to increase of her fluoxetine and referral for counseling for her chronic anxiety.  She has increased her levothyroxine dosing as instructed about 8 wks ago when TSH came back slightly elevated.  She has a painful bunion deformity on R foot, asks for podiatry referral.  Past Medical History:  Diagnosis Date  . GAD (generalized anxiety disorder)   . GERD (gastroesophageal reflux disease)    well controlled with ranitidine bid  . Hay fever   . Hypothyroidism   . IBS (irritable bowel syndrome)   . Insomnia   . Moderate persistent asthma    Not taking controller meds b/c she said she improved significantly with cutting back on tobacco use.  . Psoriasis   . Tobacco dependence    ongoing as of 02/2017    Past Surgical History:  Procedure Laterality Date  . APPENDECTOMY    . COLONOSCOPY  2015  . endometrosis     TAH/BSO 2011  . KNEE SURGERY    . TONSILLECTOMY    . TOTAL ABDOMINAL HYSTERECTOMY W/ BILATERAL SALPINGOOPHORECTOMY  2011   Endometriosis  . TUBAL LIGATION     Social History   Socioeconomic History  . Marital status: Legally Separated    Spouse name: None  . Number of children: None  . Years of education: None  . Highest education level: None  Social Needs  . Financial resource strain: None  . Food insecurity - worry: None  . Food insecurity - inability: None   . Transportation needs - medical: None  . Transportation needs - non-medical: None  Occupational History  . None  Tobacco Use  . Smoking status: Current Every Day Smoker    Packs/day: 0.50    Types: Cigarettes  . Smokeless tobacco: Never Used  Substance and Sexual Activity  . Alcohol use: Yes    Comment: socially  . Drug use: No  . Sexual activity: Yes    Birth control/protection: Surgical  Other Topics Concern  . None  Social History Narrative   Widowed, 1 son and 1 daughter.   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   Tob: 40 pack-yr hx, current as of 02/2017.   Alcohol: socially.       Family History  Problem Relation Age of Onset  . Diabetes Father   . Heart disease Father   . Hypertension Father   . Early death Father   . Hyperlipidemia Father   . Cancer Paternal Grandmother   . Hyperlipidemia Mother   . Hypertension Mother   . Arthritis Maternal Grandmother   . Diabetes Maternal Grandmother   . Hypertension Maternal Grandmother   . Hyperlipidemia Maternal Grandmother   . Kidney disease Maternal Grandmother   . Stroke Maternal Grandmother   . Hypertension Maternal Grandfather   .  Hypertension Paternal Grandfather     Outpatient Medications Prior to Visit  Medication Sig Dispense Refill  . acetaminophen (TYLENOL) 500 MG tablet Take 1,000 mg by mouth every 8 (eight) hours as needed for mild pain, moderate pain or headache.    . albuterol (PROVENTIL HFA;VENTOLIN HFA) 108 (90 Base) MCG/ACT inhaler Inhale 1-2 puffs into the lungs every 6 (six) hours as needed for wheezing. 1 Inhaler 0  . ALPRAZolam (XANAX) 1 MG tablet Take 0.5-1 tablets (0.5-1 mg total) by mouth 2 (two) times daily as needed for anxiety. 30 tablet 0  . levothyroxine (SYNTHROID, LEVOTHROID) 75 MCG tablet 1 tab po qAM on Mondays through Fridays, and take 1 and 1/2 tabs every Saturday and Sunday.  Take 1/2 hour prior to breakfast and take separate from any vitamins or iron. 34  tablet 1  . triamcinolone (KENALOG) 0.025 % ointment Apply 1 application topically 2 (two) times daily. 454 g 0  . FLUoxetine (PROZAC) 10 MG capsule Take 1 capsule (10 mg total) by mouth daily. 30 capsule 0  . diphenhydrAMINE (BENADRYL) 25 MG tablet Take 25 mg by mouth every 8 (eight) hours as needed for allergies. Reported on 05/26/2015    . ibuprofen (ADVIL,MOTRIN) 200 MG tablet Take 600 mg by mouth every 6 (six) hours as needed for pain.    Marland Kitchen ipratropium (ATROVENT) 0.06 % nasal spray Place 2 sprays into both nostrils 4 (four) times daily. 15 mL 1  . nicotine (NICODERM CQ) 14 mg/24hr patch Place 1 patch (14 mg total) onto the skin daily. (Patient not taking: Reported on 08/22/2016) 28 patch 1  . ranitidine (ZANTAC) 75 MG tablet Take 75 mg by mouth daily as needed for heartburn.     No facility-administered medications prior to visit.     Allergies  Allergen Reactions  . Codeine Itching, Nausea And Vomiting and Other (See Comments)    Makes her feel wierd    ROS Review of Systems  Constitutional: Negative for appetite change, chills, fatigue and fever.  HENT: Negative for congestion, dental problem, ear pain and sore throat.   Eyes: Negative for discharge, redness and visual disturbance.  Respiratory: Negative for cough, chest tightness, shortness of breath and wheezing.   Cardiovascular: Negative for chest pain, palpitations and leg swelling.  Gastrointestinal: Negative for abdominal pain, blood in stool, diarrhea, nausea and vomiting.  Genitourinary: Negative for difficulty urinating, dysuria, flank pain, frequency, hematuria and urgency.  Musculoskeletal: Negative for arthralgias, back pain, joint swelling, myalgias and neck stiffness.  Skin: Negative for pallor and rash.  Neurological: Negative for dizziness, speech difficulty, weakness and headaches.  Hematological: Negative for adenopathy. Does not bruise/bleed easily.  Psychiatric/Behavioral: Negative for confusion and sleep  disturbance. The patient is not nervous/anxious.      PE: Blood pressure 130/86, pulse 66, temperature 97.8 F (36.6 C), temperature source Oral, height 5\' 5"  (1.651 m), weight 186 lb 9.6 oz (84.6 kg), SpO2 98 %. Body mass index is 31.05 kg/m. Exam chaperoned by Starla Link, CMA.  Gen: Alert, well appearing.  Patient is oriented to person, place, time, and situation. AFFECT: pleasant, lucid thought and speech. ENT: Ears: EACs clear, normal epithelium.  TMs with good light reflex and landmarks bilaterally.  Eyes: no injection, icteris, swelling, or exudate.  EOMI, PERRLA. Nose: no drainage or turbinate edema/swelling.  No injection or focal lesion.  Mouth: lips without lesion/swelling.  Oral mucosa pink and moist.  Dentition intact and without obvious caries or gingival swelling.  Oropharynx without erythema, exudate,  or swelling.  Neck: supple/nontender.  No LAD, mass, or TM.  Carotid pulses 2+ bilaterally, without bruits. CV: RRR, no m/r/g.   LUNGS: CTA bilat, nonlabored resps, good aeration in all lung fields. ABD: soft, NT, ND, BS normal.  No hepatospenomegaly or mass.  No bruits. EXT: no clubbing, cyanosis, or edema.  Musculoskeletal: no joint swelling, erythema, warmth, or tenderness except her R foot MTP joint has some erythema of lateral aspect, no signif TTP.  Bunion deformity noted.  ROM of all joints intact. Skin - no sores or suspicious lesions or rashes or color changes  LABS:   Lab Results  Component Value Date   TSH 8.03 (H) 02/27/2017   T4TOTAL 6.7 07/23/2016    Lab Results  Component Value Date   WBC 7.2 02/27/2017   HGB 14.4 02/27/2017   HCT 43.1 02/27/2017   MCV 85.0 02/27/2017   PLT 261.0 02/27/2017   Lab Results  Component Value Date   CREATININE 0.68 02/27/2017   BUN 10 02/27/2017   NA 141 02/27/2017   K 4.3 02/27/2017   CL 104 02/27/2017   CO2 31 02/27/2017   Lab Results  Component Value Date   ALT 21 02/27/2017   AST 14 02/27/2017   ALKPHOS 60  02/27/2017   BILITOT 0.4 02/27/2017   Lab Results  Component Value Date   CHOL 242 (H) 05/26/2015   Lab Results  Component Value Date   HDL 74 05/26/2015   Lab Results  Component Value Date   LDLCALC 140 (H) 05/26/2015   Lab Results  Component Value Date   TRIG 142 05/26/2015   Lab Results  Component Value Date   CHOLHDL 3.3 05/26/2015   Lab Results  Component Value Date   HGBA1C 5.6 01/04/2016   IMPRESSION AND PLAN:  1) GAD, with insomnia due to anxiety. Failed UDS x 2, so I won't rx benzo's for her any longer. Increase fluoxetine to 20mg  qd. Referral to Dr. Gaynell Face ordered.  2) Hypothyroidism: recent TSH elevation (mild). Recheck TSH level to see if dose adjustment normalized TSH.  3) R foot hallux valgus, painful. Referred pt to podiatrist today.  4) Health maintenance exam: Reviewed age and gender appropriate health maintenance issues (prudent diet, regular exercise, health risks of tobacco and excessive alcohol, use of seatbelts, fire alarms in home, use of sunscreen).  Also reviewed age and gender appropriate health screening as well as vaccine recommendations. Congratulated pt on quitting smoking. Vaccines: UTD.  Shingrix discussed-she wants to think about it. Labs: TSH recheck today since increase of dose about 8 wks ago.  All other appropriate labs UTD. Cervical ca screening: n/a due to hysterectomy with BSO for endometriosis. Breast ca screening: next mammo due 08/2017. Colon ca screening: next colonoscopy 2025.  An After Visit Summary was printed and given to the patient.  FOLLOW UP: Return in about 4 weeks (around 06/04/2017) for f/u anxiety.  Signed:  Crissie Sickles, MD           05/07/2017

## 2017-05-07 NOTE — Patient Instructions (Signed)

## 2017-05-08 LAB — TSH: TSH: 7.04 u[IU]/mL — ABNORMAL HIGH (ref 0.35–4.50)

## 2017-05-15 ENCOUNTER — Other Ambulatory Visit: Payer: Self-pay

## 2017-05-15 ENCOUNTER — Telehealth: Payer: Self-pay | Admitting: Family Medicine

## 2017-05-15 MED ORDER — LEVOTHYROXINE SODIUM 100 MCG PO TABS: 100.0000 ug | ORAL_TABLET | Freq: Every day | ORAL | 2 refills | Status: DC

## 2017-05-15 NOTE — Telephone Encounter (Signed)
Pt. Called regarding this prescription.  She is out of medication and Dr. Anitra Lauth was going to up medication to 100mg . A day.   Please call in prescription to [facility].

## 2017-05-15 NOTE — Telephone Encounter (Signed)
Copied from [place] [id number]. Topic: Quick Communication - See Telephone Encounter >> May 15, 2017  8:44 AM Ahmed Prima L wrote: CRM for notification. See Telephone encounter for: 05/15/17.  [facility] called and said that the pt told them that Dr Anitra Lauth was going to increase her levothyroxine (SYNTHROID, LEVOTHROID) 75 MCG tablet. Pharmacy states they went ahead and gave her just a few of the recent script of 17mcg. Please advise. Call back is [phone].

## 2017-05-15 NOTE — Telephone Encounter (Signed)
Patient notified that new Rx was sent to pharmacy requested.

## 2017-06-04 ENCOUNTER — Telehealth: Payer: Self-pay | Admitting: Family Medicine

## 2017-06-04 NOTE — Telephone Encounter (Signed)
Copied from [place] [id number]. Topic: Inquiry >> Jun 04, 2017  2:07 PM Margot Ables wrote: Reason for CRM: Pt requesting medication for what she feels is a sinus infection. She states that she takes zyrtec daily. She has albuterol inhaler just when needed. 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  Pt has a hard time getting someone to cover for her to get out of work for an appointment.   [place] [phone] (Phone) [phone] (Fax)

## 2017-06-04 NOTE — Telephone Encounter (Signed)
Pt needs office visit.  SW pt, she stated that she will call back in a few days to schedule an apt if she is not feeling better.

## 2017-06-09 ENCOUNTER — Ambulatory Visit: Payer: Self-pay | Admitting: Family Medicine

## 2017-06-25 ENCOUNTER — Ambulatory Visit: Payer: Self-pay | Admitting: Family Medicine

## 2017-08-11 ENCOUNTER — Encounter: Payer: Self-pay | Admitting: Family Medicine

## 2017-08-11 ENCOUNTER — Ambulatory Visit (INDEPENDENT_AMBULATORY_CARE_PROVIDER_SITE_OTHER): Payer: 59 | Admitting: Family Medicine

## 2017-08-11 ENCOUNTER — Other Ambulatory Visit: Payer: Self-pay | Admitting: Family Medicine

## 2017-08-11 VITALS — BP 132/80 | HR 64 | Temp 97.7°F | Resp 16 | Ht 65.0 in | Wt 194.0 lb

## 2017-08-11 DIAGNOSIS — E669 Obesity, unspecified: Secondary | ICD-10-CM

## 2017-08-11 DIAGNOSIS — F411 Generalized anxiety disorder: Secondary | ICD-10-CM

## 2017-08-11 DIAGNOSIS — E039 Hypothyroidism, unspecified: Secondary | ICD-10-CM

## 2017-08-11 LAB — TSH: TSH: 1.25 u[IU]/mL (ref 0.35–4.50)

## 2017-08-11 MED ORDER — LEVOTHYROXINE SODIUM 100 MCG PO TABS: 100.0000 ug | ORAL_TABLET | Freq: Every day | ORAL | 1 refills | Status: DC

## 2017-08-11 MED ORDER — FLUOXETINE HCL 20 MG PO CAPS: 20.0000 mg | ORAL_CAPSULE | Freq: Every day | ORAL | 3 refills | Status: DC

## 2017-08-11 NOTE — Progress Notes (Signed)
OFFICE VISIT  08/11/2017   CC:  Chief Complaint  Patient presents with  . Follow-up    RCI, pt is not fasting.    HPI:    Patient is a 57 y.o.  female who presents for 3 mo f/u hypothyroidism. TSH's have been mildly elevated so I've been increasing her T4 dose slowly. Last increase was to 100 mcg qd about 3 mo ago.  Stress level and anxiety are pretty bad lately.  Taking the fluoxetine daily.  Lots of situations in life going bad: relationship issues, work issues. We had stopped her xanax b/c of a UDS that came back with marijuana.  She says she took one puff to see if it would help with her anxiety and her level was just barely above the positive.  However,on recheck UDS a few weeks later showed no marijuana but also did not show her benzo that she says she was taking. We reviewed this situation again today. She says counseling has never helped her in the past.   Past Medical History:  Diagnosis Date  . GAD (generalized anxiety disorder)    NO BENZOS DUE TO PT FAILING UDS X 2 IN 2019.  Marland Kitchen GERD (gastroesophageal reflux disease)    well controlled with ranitidine bid  . Hay fever   . Hypothyroidism   . IBS (irritable bowel syndrome)   . Insomnia   . Marijuana use 2019   Failed UDS x 2 2019; NO FURTHER CONTROLLED SUBSTANCES ARE TO BE PRESCRIBED FOR THIS PATIENT.  . Moderate persistent asthma    Not taking controller meds b/c she said she improved significantly with cutting back on tobacco use.  . Psoriasis   . Tobacco dependence    ongoing as of 02/2017    Past Surgical History:  Procedure Laterality Date  . APPENDECTOMY    . COLONOSCOPY  2015  . endometrosis     TAH/BSO 2011  . KNEE SURGERY    . TONSILLECTOMY    . TOTAL ABDOMINAL HYSTERECTOMY W/ BILATERAL SALPINGOOPHORECTOMY  2011   Endometriosis  . TUBAL LIGATION      Outpatient Medications Prior to Visit  Medication Sig Dispense Refill  . acetaminophen (TYLENOL) 500 MG tablet Take 1,000 mg by mouth every 8  (eight) hours as needed for mild pain, moderate pain or headache.    . albuterol (PROVENTIL HFA;VENTOLIN HFA) 108 (90 Base) MCG/ACT inhaler Inhale 1-2 puffs into the lungs every 6 (six) hours as needed for wheezing. 1 Inhaler 0  . levothyroxine (SYNTHROID, LEVOTHROID) 100 MCG tablet Take 1 tablet (100 mcg total) by mouth daily. 30 tablet 2  . triamcinolone (KENALOG) 0.025 % ointment Apply 1 application topically 2 (two) times daily. 454 g 0  . FLUoxetine (PROZAC) 20 MG capsule Take 1 capsule (20 mg total) by mouth daily. 30 capsule 1  . ALPRAZolam (XANAX) 1 MG tablet Take 0.5-1 tablets (0.5-1 mg total) by mouth 2 (two) times daily as needed for anxiety. (Patient not taking: Reported on 08/11/2017) 30 tablet 0  . levothyroxine (SYNTHROID, LEVOTHROID) 75 MCG tablet 1 tab po qAM on Mondays through Fridays, and take 1 and 1/2 tabs every Saturday and Sunday.  Take 1/2 hour prior to breakfast and take separate from any vitamins or iron. (Patient not taking: Reported on 08/11/2017) 34 tablet 1   No facility-administered medications prior to visit.     Allergies  Allergen Reactions  . Codeine Itching, Nausea And Vomiting and Other (See Comments)    Makes her feel wierd  ROS As per HPI  PE: Blood pressure 132/80, pulse 64, temperature 97.7 F (36.5 C), temperature source Oral, resp. rate 16, height 5\' 5"  (1.651 m), weight 194 lb (88 kg), SpO2 96 %. Gen: Alert, well appearing.  Patient is oriented to person, place, time, and situation. AFFECT: pleasant, lucid thought and speech. No further exam today.  LABS:  Lab Results  Component Value Date   TSH 7.04 (H) 05/07/2017     Chemistry      Component Value Date/Time   NA 141 02/27/2017 1443   K 4.3 02/27/2017 1443   CL 104 02/27/2017 1443   CO2 31 02/27/2017 1443   BUN 10 02/27/2017 1443   CREATININE 0.68 02/27/2017 1443   CREATININE 0.91 07/23/2016 1600      Component Value Date/Time   CALCIUM 9.7 02/27/2017 1443   ALKPHOS 60  02/27/2017 1443   AST 14 02/27/2017 1443   ALT 21 02/27/2017 1443   BILITOT 0.4 02/27/2017 1443      IMPRESSION AND PLAN:  1) Hypothyroidism: up-titrating dose lately. Recheck TSH today.  Send in appropriate T4 dose when result returns.  2) GAD: she declined to have her fluoxetine increased. She declines counseling referral. Denies depression.  An After Visit Summary was printed and given to the patient.  FOLLOW UP: Return in about 6 months (around 02/10/2018) for f/u hypoth and anxiety.  Signed:  Crissie Sickles, MD           08/11/2017

## 2017-08-12 ENCOUNTER — Encounter: Payer: Self-pay | Admitting: *Deleted

## 2017-09-09 ENCOUNTER — Ambulatory Visit: Payer: Self-pay | Admitting: Family Medicine

## 2017-09-09 ENCOUNTER — Ambulatory Visit: Payer: 59 | Admitting: Family Medicine

## 2017-09-09 ENCOUNTER — Encounter: Payer: Self-pay | Admitting: Family Medicine

## 2017-09-09 VITALS — BP 122/82 | HR 71 | Temp 98.0°F | Resp 20 | Ht 65.0 in | Wt 195.0 lb

## 2017-09-09 DIAGNOSIS — J0100 Acute maxillary sinusitis, unspecified: Secondary | ICD-10-CM

## 2017-09-09 MED ORDER — DOXYCYCLINE HYCLATE 100 MG PO TABS: 100.0000 mg | ORAL_TABLET | Freq: Two times a day (BID) | ORAL | 0 refills | Status: DC

## 2017-09-09 MED ORDER — PREDNISONE 20 MG PO TABS: ORAL_TABLET | ORAL | 0 refills | Status: DC

## 2017-09-09 NOTE — Patient Instructions (Signed)
Rest, hydrate.  + flonase, mucinex,  nasal saline.  Doxycyline prescribed, take until completed.  Prednisone taper.  If cough present it can last up to 6-8 weeks.  F/U 2 weeks of not improved.    Sinusitis, Adult Sinusitis is soreness and inflammation of your sinuses. Sinuses are hollow spaces in the bones around your face. They are located:  Around your eyes.  In the middle of your forehead.  Behind your nose.  In your cheekbones.  Your sinuses and nasal passages are lined with a stringy fluid (mucus). Mucus normally drains out of your sinuses. When your nasal tissues get inflamed or swollen, the mucus can get trapped or blocked so air cannot flow through your sinuses. This lets bacteria, viruses, and funguses grow, and that leads to infection. Follow these instructions at home: Medicines  Take, use, or apply over-the-counter and prescription medicines only as told by your doctor. These may include nasal sprays.  If you were prescribed an antibiotic medicine, take it as told by your doctor. Do not stop taking the antibiotic even if you start to feel better. Hydrate and Humidify  Drink enough water to keep your pee (urine) clear or pale yellow.  Use a cool mist humidifier to keep the humidity level in your home above 50%.  Breathe in steam for 10-15 minutes, 3-4 times a day or as told by your doctor. You can do this in the bathroom while a hot shower is running.  Try not to spend time in cool or dry air. Rest  Rest as much as possible.  Sleep with your head raised (elevated).  Make sure to get enough sleep each night. General instructions  Put a warm, moist washcloth on your face 3-4 times a day or as told by your doctor. This will help with discomfort.  Wash your hands often with soap and water. If there is no soap and water, use hand sanitizer.  Do not smoke. Avoid being around people who are smoking (secondhand smoke).  Keep all follow-up visits as told by your  doctor. This is important. Contact a doctor if:  You have a fever.  Your symptoms get worse.  Your symptoms do not get better within 10 days. Get help right away if:  You have a very bad headache.  You cannot stop throwing up (vomiting).  You have pain or swelling around your face or eyes.  You have trouble seeing.  You feel confused.  Your neck is stiff.  You have trouble breathing. This information is not intended to replace advice given to you by your health care provider. Make sure you discuss any questions you have with your health care provider. Document Released: 07/24/2007 Document Revised: 10/01/2015 Document Reviewed: 11/30/2014 Elsevier Interactive Patient Education  [employer].

## 2017-09-09 NOTE — Progress Notes (Signed)
Jamie Mercado , [birth year], 57 y.o., female MRN: [id number] Patient Care Team    Relationship Specialty Notifications Start End  McGowen, Adrian Blackwater, MD PCP - General Family Medicine  02/27/17     Chief Complaint  Patient presents with  . URI    congestion facial pressure , teeth hurt, ear pain x 2 weeks     Subjective: Pt presents for an OV with complaints of congestion of 2 weeks duration.  Associated symptoms include progression to facial pain, teeth pain, dizziness, ear pain. fatigue, headaches. She denies fever, chills or nausea.  Pt has tried mucinex, afrin and OTC sinus meds to ease their symptoms.   Depression screen [facility] 2/9 08/11/2017 05/07/2017 02/27/2017 01/04/2016 05/26/2015  Decreased Interest 0 0 0 0 0  Down, Depressed, Hopeless 0 0 0 0 0  PHQ - 2 Score 0 0 0 0 0  Altered sleeping 3 1 - - -  Tired, decreased energy 0 0 - - -  Change in appetite - 0 - - -  Feeling bad or failure about yourself  0 0 - - -  Trouble concentrating 3 0 - - -  Moving slowly or fidgety/restless 3 0 - - -  Suicidal thoughts 0 0 - - -  PHQ-9 Score 9 1 - - -  Difficult doing work/chores Somewhat difficult Not difficult at all - - -    Allergies  Allergen Reactions  . Codeine Itching, Nausea And Vomiting and Other (See Comments)    Makes her feel wierd   Social History   Tobacco Use  . Smoking status: Current Every Day Smoker    Packs/day: 0.50    Types: Cigarettes  . Smokeless tobacco: Never Used  Substance Use Topics  . Alcohol use: Yes    Comment: socially   Past Medical History:  Diagnosis Date  . GAD (generalized anxiety disorder)    NO BENZOS DUE TO PT FAILING UDS X 2 IN 2019.  Marland Kitchen GERD (gastroesophageal reflux disease)    well controlled with ranitidine bid  . Hay fever   . Hypothyroidism   . IBS (irritable bowel syndrome)   . Insomnia   . Marijuana use 2019   Failed UDS x 2 2019; NO FURTHER CONTROLLED SUBSTANCES ARE TO BE PRESCRIBED FOR THIS PATIENT.  . Moderate  persistent asthma    Not taking controller meds b/c she said she improved significantly with cutting back on tobacco use.  . Psoriasis   . Tobacco dependence    ongoing as of 02/2017   Past Surgical History:  Procedure Laterality Date  . APPENDECTOMY    . COLONOSCOPY  2015  . endometrosis     TAH/BSO 2011  . KNEE SURGERY    . TONSILLECTOMY    . TOTAL ABDOMINAL HYSTERECTOMY W/ BILATERAL SALPINGOOPHORECTOMY  2011   Endometriosis  . TUBAL LIGATION     Family History  Problem Relation Age of Onset  . Diabetes Father   . Heart disease Father   . Hypertension Father   . Early death Father   . Hyperlipidemia Father   . Cancer Paternal Grandmother   . Hyperlipidemia Mother   . Hypertension Mother   . Arthritis Maternal Grandmother   . Diabetes Maternal Grandmother   . Hypertension Maternal Grandmother   . Hyperlipidemia Maternal Grandmother   . Kidney disease Maternal Grandmother   . Stroke Maternal Grandmother   . Hypertension Maternal Grandfather   . Hypertension Paternal Grandfather    Allergies as of 09/09/2017  Reactions   Codeine Itching, Nausea And Vomiting, Other (See Comments)   Makes her feel wierd      Medication List        Accurate as of 09/09/17  3:04 PM. Always use your most recent med list.          acetaminophen 500 MG tablet Commonly known as:  TYLENOL Take 1,000 mg by mouth every 8 (eight) hours as needed for mild pain, moderate pain or headache.   albuterol 108 (90 Base) MCG/ACT inhaler Commonly known as:  PROVENTIL HFA;VENTOLIN HFA Inhale 1-2 puffs into the lungs every 6 (six) hours as needed for wheezing.   ALPRAZolam 1 MG tablet Commonly known as:  XANAX Take 0.5-1 tablets (0.5-1 mg total) by mouth 2 (two) times daily as needed for anxiety.   FLUoxetine 20 MG capsule Commonly known as:  PROZAC Take 1 capsule (20 mg total) by mouth daily.   levothyroxine 100 MCG tablet Commonly known as:  SYNTHROID, LEVOTHROID Take 1 tablet (100 mcg  total) by mouth daily.   triamcinolone 0.025 % ointment Commonly known as:  KENALOG Apply 1 application topically 2 (two) times daily.       All past medical history, surgical history, allergies, family history, immunizations andmedications were updated in the EMR today and reviewed under the history and medication portions of their EMR.     ROS: Negative, with the exception of above mentioned in HPI   Objective:  BP 122/82 (BP Location: Left Arm, Patient Position: Sitting, Cuff Size: Large)   Pulse 71   Temp 98 F (36.7 C)   Resp 20   Ht 5\' 5"  (1.651 m)   Wt 195 lb (88.5 kg)   SpO2 99%   BMI 32.45 kg/m  Body mass index is 32.45 kg/m. Gen: Afebrile. No acute distress. Nontoxic in appearance, well developed, well nourished. Very pleasant caucasian female.  HENT: AT. [place]. Bilateral TM visualized with fullness bilateral, no erythema or bulging.  MMM, no oral lesions. Bilateral nares with erythema, swelling and drainage. Throat without erythema or exudates. No cough. Hoarseness present. TTP max sinus Eyes:Pupils Equal Round Reactive to light, Extraocular movements intact,  Conjunctiva without redness, discharge or icterus. Neck/lymp/endocrine: Supple,no lymphadenopathy CV: RRR Chest: CTAB, no wheeze or crackles. Good air movement, normal resp effort.  Abd: Soft. NTND. BS present. no Masses palpated. No rebound or guarding.  Skin: no rashes, purpura or petechiae.  Neuro:  Normal gait. PERLA. EOMi. Alert. Oriented x3   No exam data present No results found. No results found for this or any previous visit (from the past 24 hour(s)).  Assessment/Plan: Jamie Mercado is a 57 y.o. female present for OV for  Acute non-recurrent maxillary sinusitis Rest, hydrate.  + flonase, mucinex,  nasal saline.  Doxycyline prescribed, take until completed.  Prednisone taper.  If cough present it can last up to 6-8 weeks.  F/U 2 weeks of not improved.     Reviewed expectations re: course  of current medical issues.  Discussed self-management of symptoms.  Outlined signs and symptoms indicating need for more acute intervention.  Patient verbalized understanding and all questions were answered.  Patient received an After-Visit Summary.    No orders of the defined types were placed in this encounter.    Note is dictated utilizing voice recognition software. Although note has been proof read prior to signing, occasional typographical errors still can be missed. If any questions arise, please do not hesitate to call for verification.   electronically  signed by:  Howard Pouch, DO  [place]

## 2017-09-09 NOTE — Telephone Encounter (Signed)
Pt c/o sinus congestion for 1 week. Pt c/o pain under eyes, sides of her nose, on the sides of her face near her jaw. Pt c/o of headache of moderate severity. Last sinus infection 4 to 5 years ago and was treated with antibiotics. Pt denies nasal discharge, or fever. Pt c/o scratchy throat and left earache or pressure.  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  Pt has tried saline nasal spray and Afrin and Goody's powder for headache. Pt stated that nothing has worked. Care advice given and pt verbalized understanding. Pt requesting appointment today late in the day. PCP not available for appointments after noon. Pt given appt with Dr Raoul Pitch for this afternoon.  Reason for Disposition . Earache  Answer Assessment - Initial Assessment Questions 1. LOCATION: "Where does it hurt?"      Under eyes, sides of nose, side jaws headache 2. ONSET: "When did the sinus pain start?"  (e.g., hours, days)      1 week ago 3. SEVERITY: "How bad is the pain?"   (Scale 1-10; mild, moderate or severe)   - MILD (1-3): doesn't interfere with normal activities    - MODERATE (4-7): interferes with normal activities (e.g., work or school) or awakens from sleep   - SEVERE (8-10): excruciating pain and patient unable to do any normal activities        moderate 4. RECURRENT SYMPTOM: "Have you ever had sinus problems before?" If so, ask: "When was the last time?" and "What happened that time?"      Yes- 4-5 years-home care and antibiotic 5. NASAL CONGESTION: "Is the nose blocked?" If so, ask, "Can you open it or must you breathe through the mouth?"     no 6. NASAL DISCHARGE: "Do you have discharge from your nose?" If so ask, "What color?"     no 7. FEVER: "Do you have a fever?" If so, ask: "What is it, how was it measured, and when did it start?"      no 8. OTHER SYMPTOMS: "Do you have any other symptoms?" (e.g., sore throat, cough, earache,  difficulty breathing)     Scratchy throat left earache 9. PREGNANCY: "Is there any chance you are pregnant?" "When was your last menstrual period?"     n/a  Protocols used: SINUS PAIN OR CONGESTION-A-AH

## 2017-10-03 ENCOUNTER — Other Ambulatory Visit: Payer: Self-pay

## 2017-10-03 ENCOUNTER — Encounter (HOSPITAL_BASED_OUTPATIENT_CLINIC_OR_DEPARTMENT_OTHER): Payer: Self-pay

## 2017-10-03 ENCOUNTER — Emergency Department (HOSPITAL_BASED_OUTPATIENT_CLINIC_OR_DEPARTMENT_OTHER): Payer: 59

## 2017-10-03 ENCOUNTER — Emergency Department (HOSPITAL_BASED_OUTPATIENT_CLINIC_OR_DEPARTMENT_OTHER)
Admission: EM | Admit: 2017-10-03 | Discharge: 2017-10-03 | Disposition: A | Discharge status: Home / Self Care | Payer: 59 | Attending: Emergency Medicine | Admitting: Emergency Medicine

## 2017-10-03 DIAGNOSIS — Z79899 Other long term (current) drug therapy: Secondary | ICD-10-CM | POA: Diagnosis not present

## 2017-10-03 DIAGNOSIS — E039 Hypothyroidism, unspecified: Secondary | ICD-10-CM | POA: Diagnosis not present

## 2017-10-03 DIAGNOSIS — K272 Acute peptic ulcer, site unspecified, with both hemorrhage and perforation: Secondary | ICD-10-CM | POA: Diagnosis not present

## 2017-10-03 DIAGNOSIS — R1013 Epigastric pain: Secondary | ICD-10-CM | POA: Insufficient documentation

## 2017-10-03 DIAGNOSIS — K279 Peptic ulcer, site unspecified, unspecified as acute or chronic, without hemorrhage or perforation: Secondary | ICD-10-CM

## 2017-10-03 DIAGNOSIS — F17210 Nicotine dependence, cigarettes, uncomplicated: Secondary | ICD-10-CM | POA: Diagnosis not present

## 2017-10-03 DIAGNOSIS — J4541 Moderate persistent asthma with (acute) exacerbation: Secondary | ICD-10-CM | POA: Diagnosis not present

## 2017-10-03 LAB — COMPREHENSIVE METABOLIC PANEL
ALT: 35 U/L (ref 0–44)
ANION GAP: 9 (ref 5–15)
AST: 25 U/L (ref 15–41)
Albumin: 4.3 g/dL (ref 3.5–5.0)
Alkaline Phosphatase: 59 U/L (ref 38–126)
BUN: 14 mg/dL (ref 6–20)
CHLORIDE: 104 mmol/L (ref 98–111)
CO2: 26 mmol/L (ref 22–32)
Calcium: 9.2 mg/dL (ref 8.9–10.3)
Creatinine, Ser: 0.76 mg/dL (ref 0.44–1.00)
GFR calc Af Amer: >60 mL/min (ref >60)
Glucose, Bld: 99 mg/dL (ref 70–99)
POTASSIUM: 3.6 mmol/L (ref 3.5–5.1)
Sodium: 139 mmol/L (ref 135–145)
Total Bilirubin: 0.5 mg/dL (ref 0.3–1.2)
Total Protein: 7.2 g/dL (ref 6.5–8.1)

## 2017-10-03 LAB — CBC WITH DIFFERENTIAL/PLATELET
BASOS ABS: 0 10*3/uL (ref 0.0–0.1)
BASOS PCT: 0 %
EOS PCT: 3 %
Eosinophils Absolute: 0.3 10*3/uL (ref 0.0–0.7)
HCT: 42.5 % (ref 36.0–46.0)
Hemoglobin: 14.4 g/dL (ref 12.0–15.0)
LYMPHS PCT: 31 %
Lymphs Abs: 2.4 10*3/uL (ref 0.7–4.0)
MCH: 28.3 pg (ref 26.0–34.0)
MCHC: 33.9 g/dL (ref 30.0–36.0)
MCV: 83.7 fL (ref 78.0–100.0)
MONO ABS: 0.5 10*3/uL (ref 0.1–1.0)
Monocytes Relative: 6 %
Neutro Abs: 4.7 10*3/uL (ref 1.7–7.7)
Neutrophils Relative %: 60 %
PLATELETS: 229 10*3/uL (ref 150–400)
RBC: 5.08 MIL/uL (ref 3.87–5.11)
RDW: 13.7 % (ref 11.5–15.5)
WBC: 7.9 10*3/uL (ref 4.0–10.5)

## 2017-10-03 LAB — URINALYSIS, ROUTINE W REFLEX MICROSCOPIC
Bilirubin Urine: NEGATIVE
GLUCOSE, UA: NEGATIVE mg/dL
Hgb urine dipstick: NEGATIVE
Ketones, ur: NEGATIVE mg/dL
LEUKOCYTES UA: NEGATIVE
NITRITE: NEGATIVE
PROTEIN: NEGATIVE mg/dL
Specific Gravity, Urine: <1.005 — ABNORMAL LOW (ref 1.005–1.030)
pH: 5.5 (ref 5.0–8.0)

## 2017-10-03 LAB — PREGNANCY, URINE: PREG TEST UR: NEGATIVE

## 2017-10-03 LAB — LIPASE, BLOOD: LIPASE: 28 U/L (ref 11–51)

## 2017-10-03 IMAGING — CT CT ABD-PELV W/ CM
2 of 5 series · 16 of 46 positions shown, 18 images · IV contrast (APPLIED)
Comparison: [DATE] CT of the abdomen and pelvis without
contrast.

CLINICAL DATA: 56 y/o F; left upper quadrant and epigastric pain
for 2 months increased in the last 2 days.

EXAM:
CT ABDOMEN AND PELVIS WITH CONTRAST
TECHNIQUE: Multidetector CT imaging of the abdomen and pelvis was performed
using the standard protocol following bolus administration of
intravenous contrast.
CONTRAST:  100mL [G1] IOPAMIDOL ([G1]) INJECTION 61%

[Series 2: axial st · axial · 0.90mm/px · z∈[-472,-42]mm · 13 of 98 slices shown, 15 images]
[im 6/98  soft-tissue]
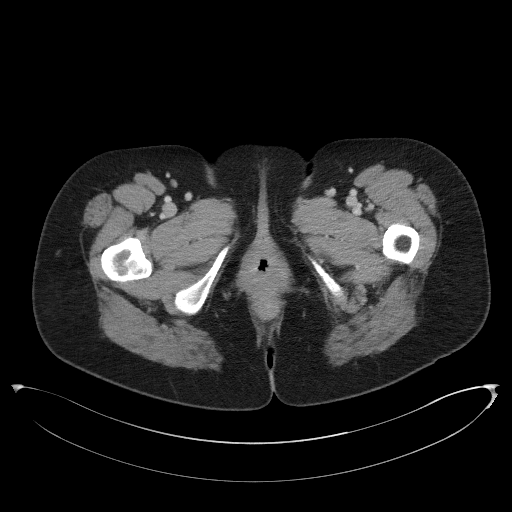
[im 6/98  bone]
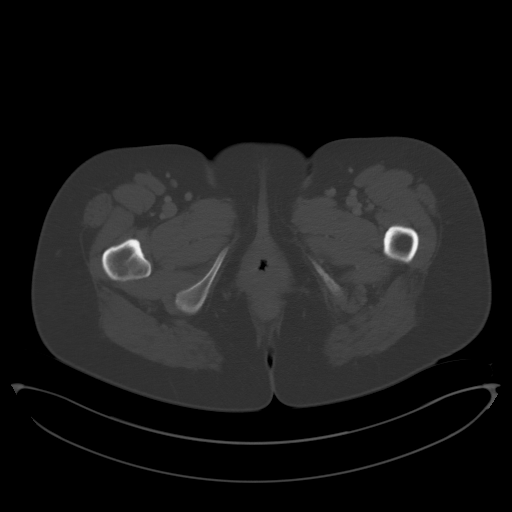
[im 11/98  soft-tissue]
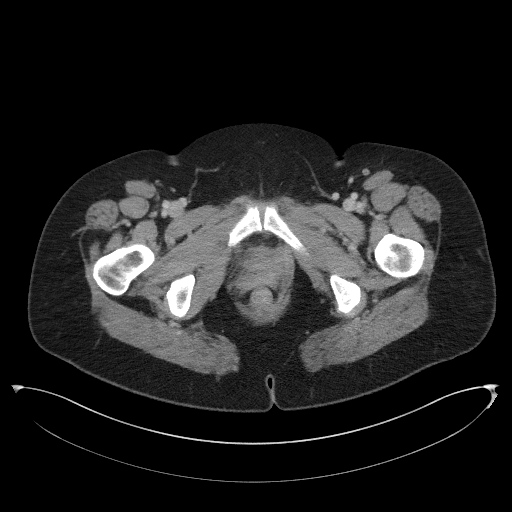
[im 22/98  soft-tissue]
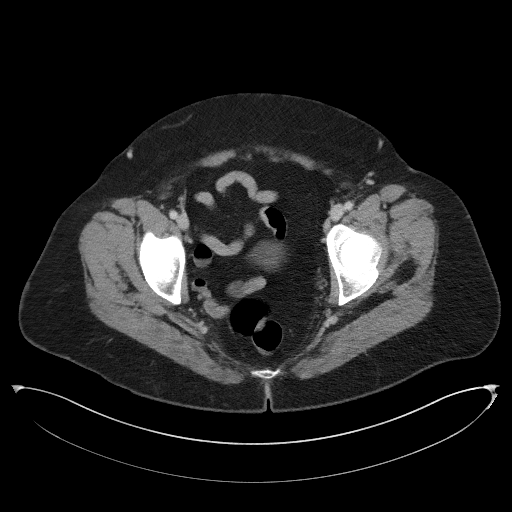
[im 27/98  soft-tissue]
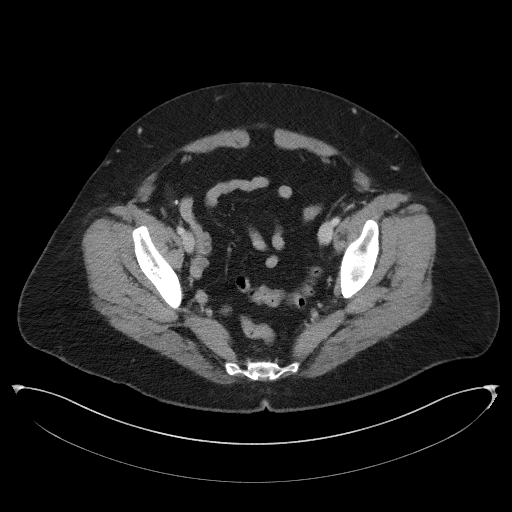
[im 33/98  soft-tissue]
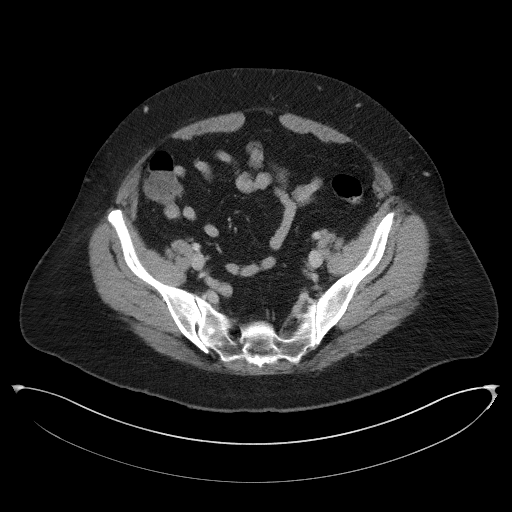
[im 44/98  soft-tissue]
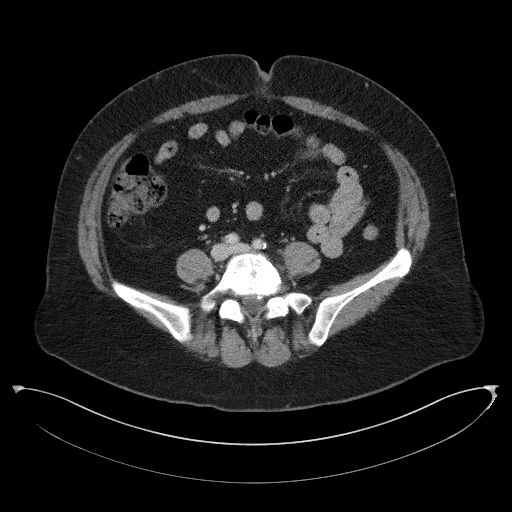
[im 49/98  soft-tissue]
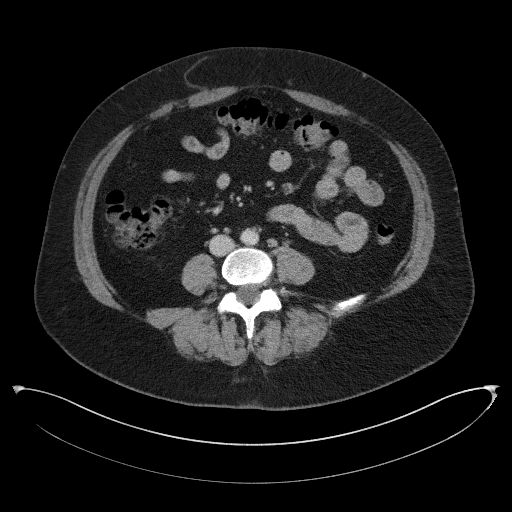
[im 54/98  soft-tissue]
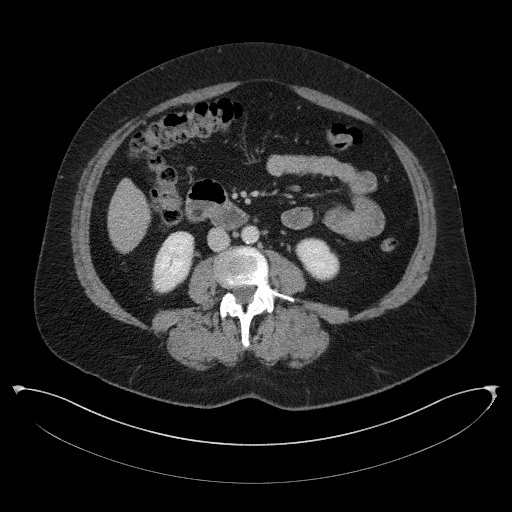
[im 65/98  soft-tissue]
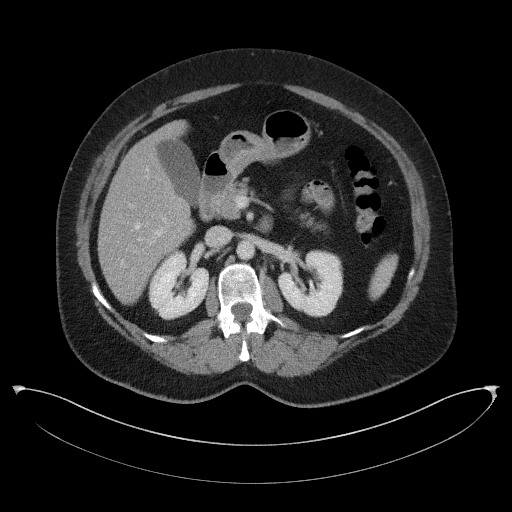
[im 65/98  bone]
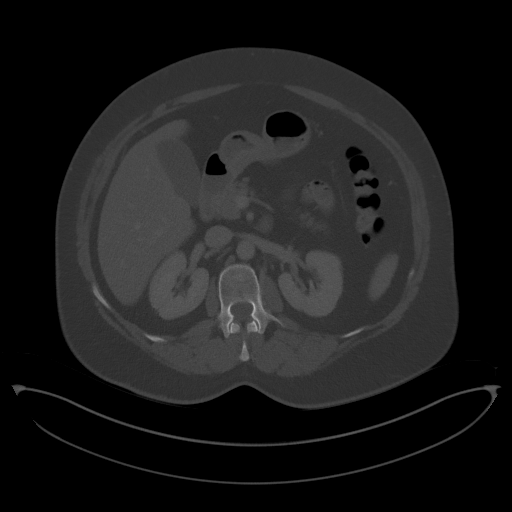
[im 71/98  soft-tissue]
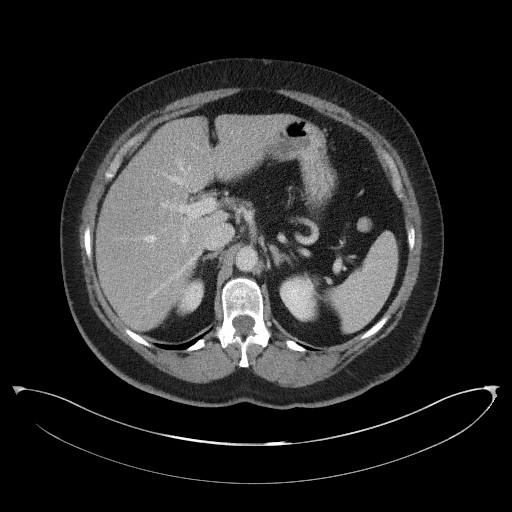
[im 76/98  soft-tissue]
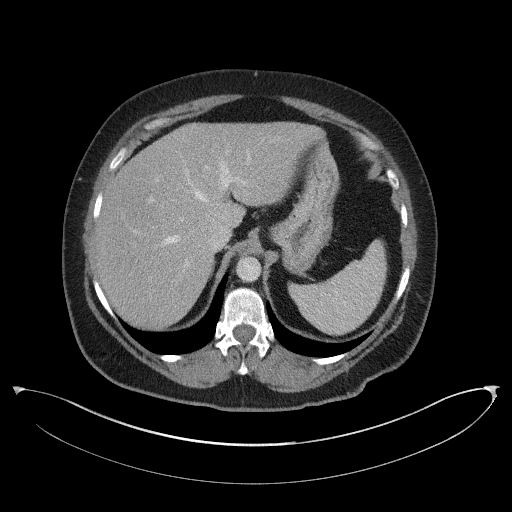
[im 87/98  soft-tissue]
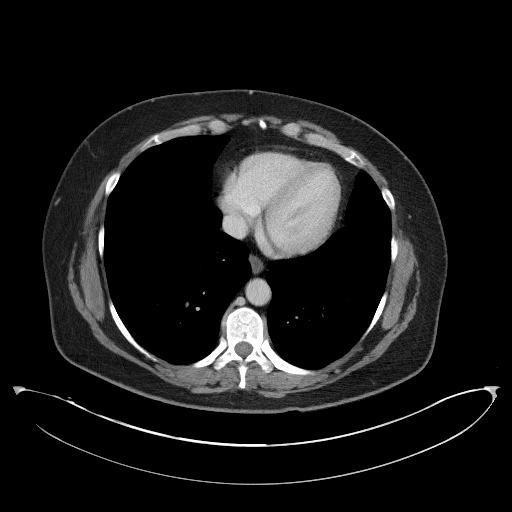
[im 92/98  soft-tissue]
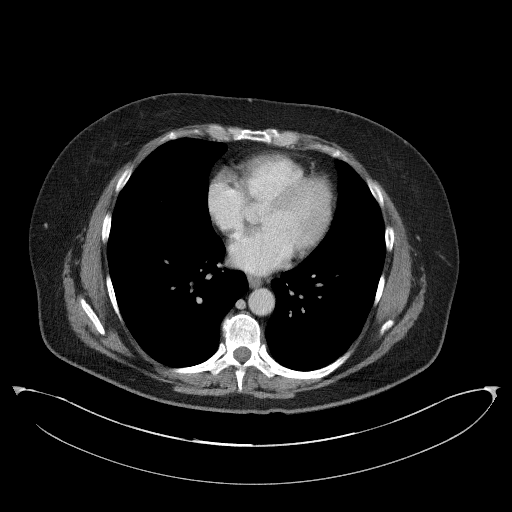

[Series 5: coronal st · coronal · 0.82mm/px · 3 of 96 slices shown]
[im 32/96  soft-tissue]
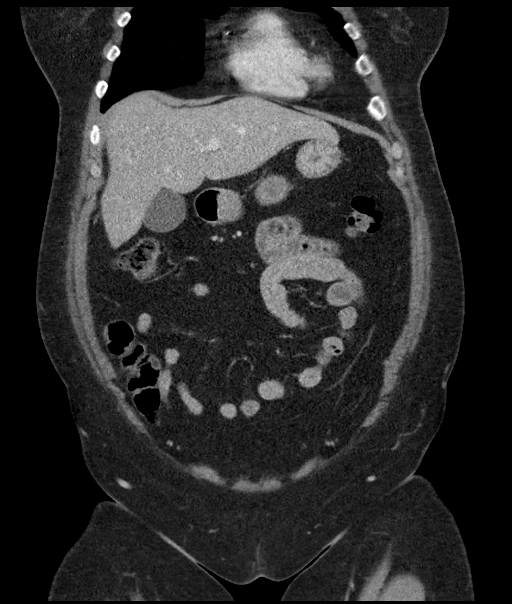
[im 43/96  soft-tissue]
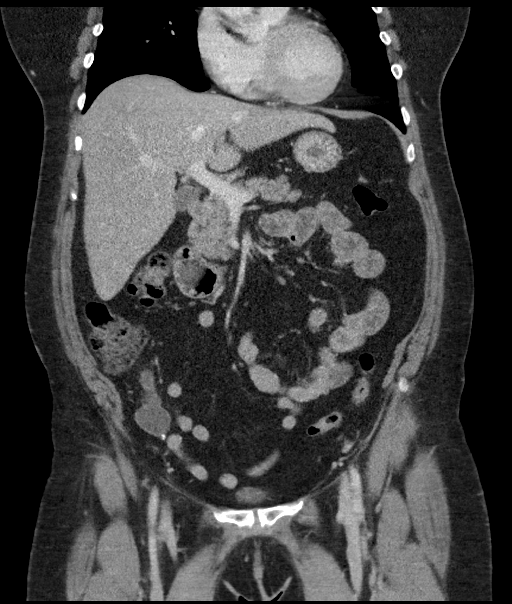
[im 53/96  soft-tissue]
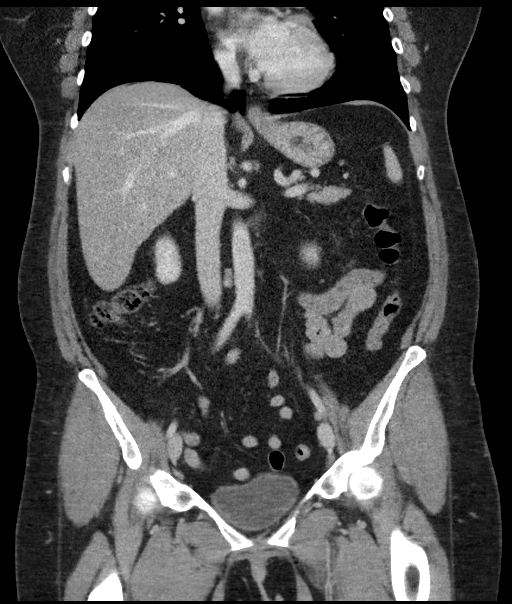

[16 of 46 positions shown; findings below may reference images not displayed]

FINDINGS: Lower chest: No acute abnormality.

Hepatobiliary: No focal liver abnormality is seen. No gallstones,
gallbladder wall thickening, or biliary dilatation.

Pancreas: Unremarkable. No pancreatic ductal dilatation or
surrounding inflammatory changes.

Spleen: Normal in size without focal abnormality.

Adrenals/Urinary Tract: Adrenal glands are unremarkable. Right
kidney lower pole 9 mm cyst. Otherwise kidneys are normal, without
renal calculi, focal lesion, or hydronephrosis. Bladder is
unremarkable.

Stomach/Bowel: Stomach is within normal limits. Appendectomy. No
evidence of bowel wall thickening, distention, or inflammatory
changes. Stable 8 mm lipoma within the second segment of the
duodenum.

Vascular/Lymphatic: Aortic atherosclerosis. No enlarged abdominal or
pelvic lymph nodes.

Reproductive: Status post hysterectomy. No adnexal masses.

Other: No abdominal wall hernia or abnormality. No abdominopelvic
ascites.

Musculoskeletal: No fracture is seen. Stable lumbar spondylosis with
a L4-5 central disc protrusion and severe L5-S1 loss of disc space
height.
IMPRESSION: No acute process identified. Stable CT of the abdomen and pelvis
given differences in technique.

By: EVOME M.D.

## 2017-10-03 MED ORDER — OMEPRAZOLE 20 MG PO CPDR: 20.0000 mg | DELAYED_RELEASE_CAPSULE | Freq: Every day | ORAL | 0 refills | Status: DC

## 2017-10-03 MED ORDER — HYDROMORPHONE HCL 1 MG/ML IJ SOLN
1.0000 mg | Freq: Once | INTRAMUSCULAR | Status: AC
  Administered 2017-10-03: 1 mg via INTRAVENOUS
  Filled 2017-10-03: qty 1

## 2017-10-03 MED ORDER — IOPAMIDOL (ISOVUE-300) INJECTION 61%
100.0000 mL | Freq: Once | INTRAVENOUS | Status: AC | PRN
  Administered 2017-10-03: 100 mL via INTRAVENOUS

## 2017-10-03 MED ORDER — GI COCKTAIL ~~LOC~~
30.0000 mL | Freq: Once | ORAL | Status: AC
  Administered 2017-10-03: 30 mL via ORAL
  Filled 2017-10-03: qty 30

## 2017-10-03 MED ORDER — SUCRALFATE 1 G PO TABS: 1.0000 g | ORAL_TABLET | Freq: Three times a day (TID) | ORAL | 0 refills | Status: DC

## 2017-10-03 MED ORDER — ONDANSETRON HCL 4 MG/2ML IJ SOLN
4.0000 mg | Freq: Once | INTRAMUSCULAR | Status: AC
  Administered 2017-10-03: 4 mg via INTRAVENOUS
  Filled 2017-10-03: qty 2

## 2017-10-03 MED ORDER — SODIUM CHLORIDE 0.9 % IV BOLUS
1000.0000 mL | Freq: Once | INTRAVENOUS | Status: AC
  Administered 2017-10-03: 1000 mL via INTRAVENOUS

## 2017-10-03 NOTE — ED Notes (Signed)
Patient transported to CT 

## 2017-10-03 NOTE — ED Triage Notes (Signed)
C/o abd pain x 2 months-worse x 2 days with fever and vomited x 1-pain worse after eating-NAD-steady gait

## 2017-10-03 NOTE — ED Provider Notes (Signed)
[facility] DEPART[facility] Provider Note   CSN: [id number] Arrival date & time: 10/03/17  1844     History   Chief Complaint Chief Complaint  Patient presents with  . Abdominal Pain    HPI Jamie Mercado is a 57 y.o. female.  HPI    57 year old female with past medical history as below here with epigastric pain.  The patient states that over the last several days, she has had progressively worsening aching, gnawing, severe epigastric pain.  The pain radiates towards his back.  She has had associated nausea and belching.  Pain occasionally feels like a burning sensation that goes up to his chest, but then resolves.  No shortness of breath.  No history of heart disease.  She states the pain is worse with eating.  No alleviating factors.  She reports she does have history of acid reflux, does not regularly take medications.  She also regularly takes BC powders.  Denies any melena.  Denies any hematochezia.  Denies any blood in her emesis.  No fevers or chills.  No lower abdominal pain, dysuria, vaginal bleeding, vaginal discharge.  Past Medical History:  Diagnosis Date  . GAD (generalized anxiety disorder)    NO BENZOS DUE TO PT FAILING UDS X 2 IN 2019.  Marland Kitchen GERD (gastroesophageal reflux disease)    well controlled with ranitidine bid  . Hay fever   . Hypothyroidism   . IBS (irritable bowel syndrome)   . Insomnia   . Marijuana use 2019   Failed UDS x 2 2019; NO FURTHER CONTROLLED SUBSTANCES ARE TO BE PRESCRIBED FOR THIS PATIENT.  . Moderate persistent asthma    Not taking controller meds b/c she said she improved significantly with cutting back on tobacco use.  . Psoriasis   . Tobacco dependence    ongoing as of 02/2017    Patient Active Problem List   Diagnosis Date Noted  . Hypothyroidism   . Psoriasis (a type of skin inflammation) 07/27/2016  . Elevated BP without diagnosis of hypertension 01/23/2016  . Generalized anxiety disorder 05/26/2015  . Insomnia  05/26/2015    Past Surgical History:  Procedure Laterality Date  . APPENDECTOMY    . COLONOSCOPY  2015  . endometrosis     TAH/BSO 2011  . KNEE SURGERY    . TONSILLECTOMY    . TOTAL ABDOMINAL HYSTERECTOMY W/ BILATERAL SALPINGOOPHORECTOMY  2011   Endometriosis  . TUBAL LIGATION       OB History    Gravida  2   Para  2   Term  2   Preterm  0   AB  0   Living        SAB  0   TAB  0   Ectopic  0   Multiple      Live Births               Home Medications    Prior to Admission medications   Medication Sig Start Date End Date Taking? Authorizing Provider  acetaminophen (TYLENOL) 500 MG tablet Take 1,000 mg by mouth every 8 (eight) hours as needed for mild pain, moderate pain or headache.    [provider]  albuterol (PROVENTIL HFA;VENTOLIN HFA) 108 (90 Base) MCG/ACT inhaler Inhale 1-2 puffs into the lungs every 6 (six) hours as needed for wheezing. 05/26/15   Micheline Chapman, NP  ALPRAZolam Duanne Moron) 1 MG tablet Take 0.5-1 tablets (0.5-1 mg total) by mouth 2 (two) times daily as needed  for anxiety. 03/19/17   McGowen, Adrian Blackwater, MD  doxycycline (VIBRA-TABS) 100 MG tablet Take 1 tablet (100 mg total) by mouth 2 (two) times daily. 09/09/17   Kuneff, Renee A, DO  FLUoxetine (PROZAC) 20 MG capsule Take 1 capsule (20 mg total) by mouth daily. 08/11/17   McGowen, Adrian Blackwater, MD  levothyroxine (SYNTHROID, LEVOTHROID) 100 MCG tablet Take 1 tablet (100 mcg total) by mouth daily. 08/11/17   McGowen, Adrian Blackwater, MD  omeprazole (PRILOSEC) 20 MG capsule Take 1 capsule (20 mg total) by mouth daily for 14 days. 10/03/17 10/17/17  Duffy Bruce, MD  predniSONE (DELTASONE) 20 MG tablet 60 mg x3d, 40 mg x3d, 20 mg x2d, 10 mg x2d 09/09/17   Kuneff, Renee A, DO  sucralfate (CARAFATE) 1 g tablet Take 1 tablet (1 g total) by mouth 4 (four) times daily -  with meals and at bedtime. 10/03/17   Duffy Bruce, MD  triamcinolone (KENALOG) 0.025 % ointment Apply 1 application topically 2  (two) times daily. 07/23/16   Scot Jun, FNP    Family History Family History  Problem Relation Age of Onset  . Diabetes Father   . Heart disease Father   . Hypertension Father   . Early death Father   . Hyperlipidemia Father   . Cancer Paternal Grandmother   . Hyperlipidemia Mother   . Hypertension Mother   . Arthritis Maternal Grandmother   . Diabetes Maternal Grandmother   . Hypertension Maternal Grandmother   . Hyperlipidemia Maternal Grandmother   . Kidney disease Maternal Grandmother   . Stroke Maternal Grandmother   . Hypertension Maternal Grandfather   . Hypertension Paternal Grandfather     Social History Social History   Tobacco Use  . Smoking status: Current Every Day Smoker    Packs/day: 0.50    Types: Cigarettes  . Smokeless tobacco: Never Used  Substance Use Topics  . Alcohol use: Yes    Comment: socially  . Drug use: No     Allergies   Codeine   Review of Systems Review of Systems  Constitutional: Positive for fatigue. Negative for chills and fever.  HENT: Negative for congestion and rhinorrhea.   Eyes: Negative for visual disturbance.  Respiratory: Negative for cough, shortness of breath and wheezing.   Cardiovascular: Negative for chest pain and leg swelling.  Gastrointestinal: Positive for abdominal pain, nausea and vomiting. Negative for diarrhea.  Genitourinary: Negative for dysuria and flank pain.  Musculoskeletal: Negative for neck pain and neck stiffness.  Skin: Negative for rash and wound.  Allergic/Immunologic: Negative for immunocompromised state.  Neurological: Negative for syncope, weakness and headaches.  All other systems reviewed and are negative.    Physical Exam Updated Vital Signs BP 120/82   Pulse 70   Temp 98.2 F (36.8 C) (Oral)   Resp 14   Ht 5\' 5"  (1.651 m)   Wt 82.9 kg   SpO2 92%   BMI 30.41 kg/m   Physical Exam  Constitutional: She is oriented to person, place, and time. She appears  well-developed and well-nourished. No distress.  HENT:  Head: Normocephalic and atraumatic.  Eyes: Conjunctivae are normal.  Neck: Neck supple.  Cardiovascular: Normal rate, regular rhythm and normal heart sounds. Exam reveals no friction rub.  No murmur heard. Pulmonary/Chest: Effort normal and breath sounds normal. No respiratory distress. She has no wheezes. She has no rales.  Abdominal: She exhibits no distension. There is tenderness in the epigastric area. There is no rigidity, no rebound, no  guarding, no tenderness at McBurney's point and negative Murphy's sign.  Musculoskeletal: She exhibits no edema.  Neurological: She is alert and oriented to person, place, and time. She exhibits normal muscle tone.  Skin: Skin is warm. Capillary refill takes less than 2 seconds.  Psychiatric: She has a normal mood and affect.  Nursing note and vitals reviewed.    ED Treatments / Results  Labs (all labs ordered are listed, but only abnormal results are displayed) Labs Reviewed  URINALYSIS, ROUTINE W REFLEX MICROSCOPIC - Abnormal; Notable for the following components:      Result Value   Specific Gravity, Urine <1.005 (*)    All other components within normal limits  CBC WITH DIFFERENTIAL/PLATELET  COMPREHENSIVE METABOLIC PANEL  LIPASE, BLOOD  PREGNANCY, URINE    EKG EKG Interpretation  Date/Time:  Friday October 03 2017 22:08:26 EDT Ventricular Rate:  68 PR Interval:    QRS Duration: 107 QT Interval:  410 QTC Calculation: 436 R Axis:   59 Text Interpretation:  Sinus rhythm No significant change since last tracing Confirmed by Duffy Bruce [phone]) on 10/03/2017 10:50:23 PM   Radiology Ct Abdomen Pelvis W Contrast  Result Date: 10/03/2017 CLINICAL DATA:  56 y/o F; left upper quadrant and epigastric pain for 2 months increased in the last 2 days. EXAM: CT ABDOMEN AND PELVIS WITH CONTRAST TECHNIQUE: Multidetector CT imaging of the abdomen and pelvis was performed using the  standard protocol following bolus administration of intravenous contrast. CONTRAST:  121mL ISOVUE-300 IOPAMIDOL (ISOVUE-300) INJECTION 61% COMPARISON:  07/19/2016 CT of the abdomen and pelvis without contrast. FINDINGS: Lower chest: No acute abnormality. Hepatobiliary: No focal liver abnormality is seen. No gallstones, gallbladder wall thickening, or biliary dilatation. Pancreas: Unremarkable. No pancreatic ductal dilatation or surrounding inflammatory changes. Spleen: Normal in size without focal abnormality. Adrenals/Urinary Tract: Adrenal glands are unremarkable. Right kidney lower pole 9 mm cyst. Otherwise kidneys are normal, without renal calculi, focal lesion, or hydronephrosis. Bladder is unremarkable. Stomach/Bowel: Stomach is within normal limits. Appendectomy. No evidence of bowel wall thickening, distention, or inflammatory changes. 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 Vascular/Lymphatic: Aortic atherosclerosis. No enlarged abdominal or pelvic lymph nodes. Reproductive: Status post hysterectomy. No adnexal masses. Other: No abdominal wall hernia or abnormality. No abdominopelvic ascites. Musculoskeletal: No fracture is seen. Stable lumbar spondylosis with a L4-5 central disc protrusion and severe L5-S1 loss of disc space height. IMPRESSION: No acute process identified. Stable CT of the abdomen and pelvis given differences in technique. Electronically Signed   By: Kristine Garbe M.D.   On: 10/03/2017 21:43    Procedures Procedures (including critical care time)  Medications Ordered in ED Medications  HYDROmorphone (DILAUDID) injection 1 mg (1 mg Intravenous Given 10/03/17 2027)  gi cocktail (Maalox,Lidocaine,Donnatal) (30 mLs Oral Given 10/03/17 2026)  ondansetron (ZOFRAN) injection 4 mg (4 mg Intravenous Given 10/03/17 2026)  sodium chloride 0.9 % bolus 1,000 mL (0 mLs Intravenous Stopped 10/03/17 2205)  iopamidol (ISOVUE-300) 61 % injection 100 mL (100 mLs  Intravenous Contrast Given 10/03/17 2103)     Initial Impression / Assessment and Plan / ED Course  I have reviewed the triage vital signs and the nursing notes.  Pertinent labs & imaging results that were available during my care of the patient were reviewed by me and considered in my medical decision making (see chart for details).     57 year old female here with epigastric pain radiating towards her back.  Clinically, suspect gastritis versus peptic ulcer disease  due to heavy NSAID use.  Patient had symptomatic relief with GI cocktail here.  Otherwise, her lab work is reassuring.  She has normal white count.  Normal LFTs and bilirubin.  Normal CT scan.  Do not suspect cholecystitis or pancreatitis based on normal labs and imaging.  Pain is not consistent with dissection, or referred pain from cardiac etiology.  EKG is nonischemic.  Will treat with antacids and GI referral. Avoid NSAIDs.  Final Clinical Impressions(s) / ED Diagnoses   Final diagnoses:  Epigastric pain  PUD (peptic ulcer disease)    ED Discharge Orders         Ordered    omeprazole (PRILOSEC) 20 MG capsule  Daily     10/03/17 2252    sucralfate (CARAFATE) 1 g tablet  3 times daily with meals & bedtime     10/03/17 2252           Duffy Bruce, MD 10/03/17 2317

## 2017-10-03 NOTE — ED Notes (Signed)
ED Provider at bedside. 

## 2017-11-11 ENCOUNTER — Encounter: Payer: Self-pay | Admitting: Gastroenterology

## 2017-11-20 ENCOUNTER — Encounter: Payer: Self-pay | Admitting: Gastroenterology

## 2017-11-20 ENCOUNTER — Ambulatory Visit (INDEPENDENT_AMBULATORY_CARE_PROVIDER_SITE_OTHER): Payer: 59 | Admitting: Gastroenterology

## 2017-11-20 VITALS — BP 134/92 | HR 80 | Ht 65.5 in | Wt 193.2 lb

## 2017-11-20 DIAGNOSIS — R1013 Epigastric pain: Secondary | ICD-10-CM | POA: Diagnosis not present

## 2017-11-20 DIAGNOSIS — R110 Nausea: Secondary | ICD-10-CM

## 2017-11-20 DIAGNOSIS — Z86010 Personal history of colonic polyps: Secondary | ICD-10-CM

## 2017-11-20 DIAGNOSIS — R197 Diarrhea, unspecified: Secondary | ICD-10-CM

## 2017-11-20 MED ORDER — PANTOPRAZOLE SODIUM 40 MG PO TBEC: 40.0000 mg | DELAYED_RELEASE_TABLET | Freq: Two times a day (BID) | ORAL | 3 refills | Status: DC

## 2017-11-20 MED ORDER — SUCRALFATE 1 GM/10ML PO SUSP: 1.0000 g | Freq: Four times a day (QID) | ORAL | 1 refills | Status: DC | PRN

## 2017-11-20 NOTE — Patient Instructions (Addendum)
If you are age 56 or older, your body mass index should be between 23-30. Your Body mass index is 31.67 kg/m. If this is out of the aforementioned range listed, please consider follow up with your Primary Care Provider.  If you are age 67 or younger, your body mass index should be between 19-25. Your Body mass index is 31.67 kg/m. If this is out of the aformentioned range listed, please consider follow up with your Primary Care Provider.   You have been scheduled for an endoscopy. Please follow written instructions given to you at your visit today. If you use inhalers (even only as needed), please bring them with you on the day of your procedure. Your physician has requested that you go to www.[email or web address].[email or web address] and enter the access code given to you at your visit today. This web site gives a general overview about your procedure. However, you should still follow specific instructions given to you by our office regarding your preparation for the procedure.  We have sent the following medications to your pharmacy for you to pick up at your convenience: Protonix 40mg  by mouth twice daily. Carafate 15ml four times daily as needed for pain.  It was a pleasure to see you today!  Vito Cirigliano, D.O.

## 2017-11-20 NOTE — Progress Notes (Signed)
Chief Complaint: Epigastric pain   Referring Provider:     Duffy Bruce, MD (emergency department)    HPI:     Jamie Mercado is a 57 y.o. female referred to the [facility] for evaluation of epigastric pain.  She presented to the emergency department on 10/03/2017 with epigastric pain times a few days, described as progressively worsening ache, gnawing pain with radiation to the back.  She reported associated nausea and belching with regurgitation and heartburn.  Symptoms worsened with eating without any noted alleviating factors.  She has a prior history of reflux but was not taking acid suppression therapy.  She did report regularly taking BC powder.  Otherwise without any overt GI blood loss.   Evaluation in the ER notable for normal CBC, CMP, lipase, UA, negative hCG.  CT abdomen pelvis with normal liver, GB, pancreas, spleen, GI tract.  Evidence of prior appendectomy.  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  She was treated with GI cocktail, Dilaudid, Zofran and discharged home with Prilosec 20 mg daily and Carafate.  Today, she states she has had no improvement. Took meds as prescribed x2 weeks (gave 2 week supply). Trialed course of OTC Zantac and now OTC omeprazole without relief.   Pain in MEG, lasting throughout day with assocaited nausea. Emesis only x1. No alleviating factors. Worse with stress. Cites increased work stress over last number of months [employer]). Sxs have been intermittently present x6 months and now more frequent and intense. Was taking daily Goody powder prior to ER for headaches, and since ER has since changed to APAP for headaches. No other NSAIDs.   Also with intermittent diarrhea, non-bloody. Also related to stress. Sxs occur infrequently.   Colonoscopy 5-6 years ago and notable for 5 polyps. Cannot recall where or by which phsycian/practice. Thinks she  was told to repeat in 8-10 years.   Mother with "stomach issues" and diverticulitis. Otherwise, no FHx of CRC, GI malignancy, liver disease.   Past Medical History:  Diagnosis Date  . GAD (generalized anxiety disorder)    NO BENZOS DUE TO PT FAILING UDS X 2 IN 2019.  Marland Kitchen GERD (gastroesophageal reflux disease)    well controlled with ranitidine bid  . Hay fever   . Hypothyroidism   . IBS (irritable bowel syndrome)   . Insomnia   . Marijuana use 2019   Failed UDS x 2 2019; NO FURTHER CONTROLLED SUBSTANCES ARE TO BE PRESCRIBED FOR THIS PATIENT.  . Moderate persistent asthma    Not taking controller meds b/c she said she improved significantly with cutting back on tobacco use.  . Psoriasis   . Tobacco dependence    ongoing as of 02/2017     Past Surgical History:  Procedure Laterality Date  . APPENDECTOMY    . COLONOSCOPY  2015  . endometrosis     TAH/BSO 2011  . KNEE SURGERY    . TONSILLECTOMY    . TOTAL ABDOMINAL HYSTERECTOMY W/ BILATERAL SALPINGOOPHORECTOMY  2011   Endometriosis  . TUBAL LIGATION     Family History  Problem Relation Age of Onset  . Diabetes Father   . Heart disease Father   . Hypertension Father   . Early death Father   . Hyperlipidemia Father   . Cancer Paternal Grandmother   . Hyperlipidemia Mother   . Hypertension Mother   . Arthritis Maternal  Grandmother   . Diabetes Maternal Grandmother   . Hypertension Maternal Grandmother   . Hyperlipidemia Maternal Grandmother   . Kidney disease Maternal Grandmother   . Stroke Maternal Grandmother   . Hypertension Maternal Grandfather   . Hypertension Paternal Grandfather    Social History   Tobacco Use  . Smoking status: Current Every Day Smoker    Packs/day: 0.50    Types: Cigarettes  . Smokeless tobacco: Never Used  Substance Use Topics  . Alcohol use: Yes    Comment: socially  . Drug use: No   Current Outpatient Medications  Medication Sig Dispense Refill  . acetaminophen (TYLENOL) 500 MG  tablet Take 1,000 mg by mouth every 8 (eight) hours as needed for mild pain, moderate pain or headache.    . albuterol (PROVENTIL HFA;VENTOLIN HFA) 108 (90 Base) MCG/ACT inhaler Inhale 1-2 puffs into the lungs every 6 (six) hours as needed for wheezing. 1 Inhaler 0  . ALPRAZolam (XANAX) 1 MG tablet Take 0.5-1 tablets (0.5-1 mg total) by mouth 2 (two) times daily as needed for anxiety. 30 tablet 0  . doxycycline (VIBRA-TABS) 100 MG tablet Take 1 tablet (100 mg total) by mouth 2 (two) times daily. 20 tablet 0  . FLUoxetine (PROZAC) 20 MG capsule Take 1 capsule (20 mg total) by mouth daily. 90 capsule 3  . levothyroxine (SYNTHROID, LEVOTHROID) 100 MCG tablet Take 1 tablet (100 mcg total) by mouth daily. 90 tablet 1  . omeprazole (PRILOSEC) 20 MG capsule Take 1 capsule (20 mg total) by mouth daily for 14 days. 14 capsule 0  . predniSONE (DELTASONE) 20 MG tablet 60 mg x3d, 40 mg x3d, 20 mg x2d, 10 mg x2d 18 tablet 0  . sucralfate (CARAFATE) 1 g tablet Take 1 tablet (1 g total) by mouth 4 (four) times daily -  with meals and at bedtime. 40 tablet 0  . triamcinolone (KENALOG) 0.025 % ointment Apply 1 application topically 2 (two) times daily. 454 g 0   No current facility-administered medications for this visit.    Allergies  Allergen Reactions  . Codeine Itching, Nausea And Vomiting and Other (See Comments)    Makes her feel wierd     Review of Systems: All systems reviewed and negative except where noted in HPI.     Physical Exam:    Wt Readings from Last 3 Encounters:  10/03/17 182 lb 12.2 oz (82.9 kg)  09/09/17 195 lb (88.5 kg)  08/11/17 194 lb (88 kg)    There were no vitals taken for this visit. Constitutional:  Pleasant, in no acute distress. Psychiatric: Normal mood and affect. Behavior is normal. EENT: Pupils normal.  Conjunctivae are normal. No scleral icterus. Neck supple. No cervical LAD. Cardiovascular: Normal rate, regular rhythm. No edema Pulmonary/chest: Effort normal  and breath sounds normal. No wheezing, rales or rhonchi. Abdominal: Soft, nondistended, nontender. Bowel sounds active throughout. There are no masses palpable. No hepatomegaly. Neurological: Alert and oriented to person place and time. Skin: Skin is warm and dry. No rashes noted.   ASSESSMENT AND PLAN;   Jamie Mercado is a 57 y.o. female presenting with:  1) MEG pain: - EGD with random and directed bxs - Protonix 40 mg PO BID - Carafate 10 mL as needed every 6 hours  2) Nausea: Related to underlying pain as above. Will treat as above and eval for clinical improvement. EGD as above.   3) Hx of colon polyps: Colonoscopy completed at outside facility approx 5-6 years ago and  per patient n/f 5 polyps. Will need to obtain old records to review for approrpaite interval for repeat colonsocopy for ongoing surveillance.   4) Diarrhea: Loose, non-bloody stools that are related to increased stress. Occurs infrequently. Suspect component of IBS. Will eval for small bowel etiology at time of EGD with random duodenal bxs. Otherwise, she would like to hold off on colonoscopy at this time as she has had 2 in the past and given sxs related to stress.   The indications, risks, and benefits of EGD were explained to the patient in detail. Risks include but are not limited to bleeding, perforation, adverse reaction to medications, and cardiopulmonary compromise. Sequelae include but are not limited to the possibility of surgery, hositalization, and mortality. The patient verbalized understanding and wished to proceed. All questions answered, referred to scheduler. Further recommendations pending results of the exam.     Lavena Bullion, DO, FACG  11/20/2017, 3:36 PM   McGowen, Adrian Blackwater, MD

## 2017-11-21 ENCOUNTER — Telehealth: Payer: Self-pay | Admitting: Gastroenterology

## 2017-11-21 ENCOUNTER — Encounter: Payer: Self-pay | Admitting: Gastroenterology

## 2017-11-26 NOTE — Telephone Encounter (Signed)
Called patient back regarding medication. Left a message on machine.

## 2017-12-02 NOTE — Telephone Encounter (Signed)
Called patient and left her a message to notify her that I sent a Carafate Savings Card to her Pharmacy to make the cost a little more affordable for her. Asked the patient to please call me back.

## 2017-12-05 ENCOUNTER — Ambulatory Visit (AMBULATORY_SURGERY_CENTER): Payer: 59 | Admitting: Gastroenterology

## 2017-12-05 ENCOUNTER — Encounter: Payer: Self-pay | Admitting: Gastroenterology

## 2017-12-05 VITALS — BP 147/71 | HR 85 | Temp 98.4°F | Resp 19 | Ht 65.5 in | Wt 193.0 lb

## 2017-12-05 DIAGNOSIS — K2970 Gastritis, unspecified, without bleeding: Secondary | ICD-10-CM | POA: Diagnosis not present

## 2017-12-05 DIAGNOSIS — K222 Esophageal obstruction: Secondary | ICD-10-CM

## 2017-12-05 DIAGNOSIS — K2990 Gastroduodenitis, unspecified, without bleeding: Secondary | ICD-10-CM

## 2017-12-05 DIAGNOSIS — R1013 Epigastric pain: Secondary | ICD-10-CM

## 2017-12-05 DIAGNOSIS — K449 Diaphragmatic hernia without obstruction or gangrene: Secondary | ICD-10-CM | POA: Diagnosis not present

## 2017-12-05 DIAGNOSIS — D175 Benign lipomatous neoplasm of intra-abdominal organs: Secondary | ICD-10-CM

## 2017-12-05 DIAGNOSIS — K3189 Other diseases of stomach and duodenum: Secondary | ICD-10-CM

## 2017-12-05 HISTORY — PX: ESOPHAGOGASTRODUODENOSCOPY: SHX1529

## 2017-12-05 MED ORDER — SODIUM CHLORIDE 0.9 % IV SOLN: 500.0000 mL | Freq: Once | INTRAVENOUS | Status: DC

## 2017-12-05 NOTE — Progress Notes (Signed)
A/ox3 pleased with MAC, report to RN 

## 2017-12-05 NOTE — Progress Notes (Signed)
Called to room to assist during endoscopic procedure.  Patient ID and intended procedure confirmed with present staff. Received instructions for my participation in the procedure from the performing physician.  

## 2017-12-05 NOTE — Op Note (Signed)
[facility] Patient Name: Jamie Mercado Procedure Date: 12/05/2017 2:52 PM MRN: [id number] Endoscopist: Gerrit Heck , MD Age: 57 Referring MD:  Date of Birth: 02/21/1960 Gender: Female Account #: [id number] Procedure:                Upper GI endoscopy Indications:              Epigastric abdominal pain, Diarrhea, Nausea Medicines:                Monitored Anesthesia Care Procedure:                Pre-Anesthesia Assessment:                           - Prior to the procedure, a History and Physical                            was performed, and patient medications and                            allergies were reviewed. The patient's tolerance of                            previous anesthesia was also reviewed. The risks                            and benefits of the procedure and the sedation                            options and risks were discussed with the patient.                            All questions were answered, and informed consent                            was obtained. Prior Anticoagulants: The patient has                            taken no previous anticoagulant or antiplatelet                            agents. ASA Grade Assessment: II - A patient with                            mild systemic disease. After reviewing the risks                            and benefits, the patient was deemed in                            satisfactory condition to undergo the procedure.                           After obtaining informed consent, the endoscope was  passed under direct vision. Throughout the                            procedure, the patient's blood pressure, pulse, and                            oxygen saturations were monitored continuously. The                            Model GIF-[id number]) scope was introduced                            through the mouth, and advanced to the third part                            of  duodenum. The upper GI endoscopy was                            accomplished without difficulty. The patient                            tolerated the procedure well. Scope In: Scope Out: Findings:                 A non-obstructing Schatzki ring was found in the                            lower third of the esophagus. A TTS dilator was                            passed through the scope. Dilation with an 18-19-20                            mm balloon dilator was performed to 20 mm. The                            dilation site was examined and showed no bleeding,                            mucosal tear or perforation.                           Esophagogastric landmarks were identified: the                            Z-line was found at 36 cm, the gastroesophageal                            junction was found at 36 cm and the site of hiatal                            narrowing was found at 39 cm from the incisors.  A 3 cm hiatal hernia was present.                           The upper third of the esophagus and middle third                            of the esophagus were normal.                           Diffuse mild inflammation characterized by erythema                            was found in the gastric fundus and in the gastric                            body. Biopsies were taken with a cold forceps for                            Helicobacter pylori testing. Estimated blood loss                            was minimal.                           The gastric antrum and pylorus were normal.                            Biopsies were taken with a cold forceps for                            Helicobacter pylori testing. Estimated blood loss                            was minimal.                           There was a small lipoma in the second portion of                            the duodenum.                           Normal mucosa was found in the duodenal bulb, in                             the first portion of the duodenum, in the second                            portion of the duodenum and in the third portion of                            the duodenum. Biopsies for histology were taken  with a cold forceps for evaluation of celiac                            disease. Estimated blood loss was minimal. Complications:            No immediate complications. Estimated Blood Loss:     Estimated blood loss was minimal. Impression:               - Non-obstructing Schatzki ring. Dilated.                           - Esophagogastric landmarks identified.                           - 3 cm hiatal hernia.                           - Normal upper third of esophagus and middle third                            of esophagus.                           - Gastritis. Biopsied.                           - Normal antrum and pylorus. Biopsied.                           - Duodenal lipoma.                           - Normal mucosa was found in the duodenal bulb, in                            the first portion of the duodenum, in the second                            portion of the duodenum and in the third portion of                            the duodenum. Biopsied. Recommendation:           - Patient has a contact number available for                            emergencies. The signs and symptoms of potential                            delayed complications were discussed with the                            patient. Return to normal activities tomorrow.                            Written discharge instructions were provided to the  patient.                           - Resume previous diet today.                           - Continue present medications.                           - Await pathology results. Gerrit Heck, MD 12/05/2017 3:19:12 PM

## 2017-12-05 NOTE — Patient Instructions (Signed)
Handouts given on dilation diet, GERD protocol and stricture.   YOU HAD AN ENDOSCOPIC PROCEDURE TODAY AT [facility]:   Refer to the procedure report that was given to you for any specific questions about what was found during the examination.  If the procedure report does not answer your questions, please call your gastroenterologist to clarify.  If you requested that your care partner not be given the details of your procedure findings, then the procedure report has been included in a sealed envelope for you to review at your convenience later.  YOU SHOULD EXPECT: Some feelings of bloating in the abdomen. Passage of more gas than usual.  Walking can help get rid of the air that was put into your GI tract during the procedure and reduce the bloating. If you had a lower endoscopy (such as a colonoscopy or flexible sigmoidoscopy) you may notice spotting of blood in your stool or on the toilet paper. If you underwent a bowel prep for your procedure, you may not have a normal bowel movement for a few days.  Please Note:  You might notice some irritation and congestion in your nose or some drainage.  This is from the oxygen used during your procedure.  There is no need for concern and it should clear up in a day or so.  SYMPTOMS TO REPORT IMMEDIATELY:    Following upper endoscopy (EGD)  Vomiting of blood or coffee ground material  New chest pain or pain under the shoulder blades  Painful or persistently difficult swallowing  New shortness of breath  Fever of 100F or higher  Black, tarry-looking stools  For urgent or emergent issues, a gastroenterologist can be reached at any hour by calling [phone].   DIET:  Dilation diet.  Drink plenty of fluids but you should avoid alcoholic beverages for 24 hours.  ACTIVITY:  You should plan to take it easy for the rest of today and you should NOT DRIVE or use heavy machinery until tomorrow (because of the sedation medicines used  during the test).    FOLLOW UP: Our staff will call the number listed on your records the next business day following your procedure to check on you and address any questions or concerns that you may have regarding the information given to you following your procedure. If we do not reach you, we will leave a message.  However, if you are feeling well and you are not experiencing any problems, there is no need to return our call.  We will assume that you have returned to your regular daily activities without incident.  If any biopsies were taken you will be contacted by phone or by letter within the next 1-3 weeks.  Please call us at [phone] if you have not heard about the biopsies in 3 weeks.    SIGNATURES/CONFIDENTIALITY: You and/or your care partner have signed paperwork which will be entered into your electronic medical record.  These signatures attest to the fact that that the information above on your After Visit Summary has been reviewed and is understood.  Full responsibility of the confidentiality of this discharge information lies with you and/or your care-partner.

## 2017-12-08 ENCOUNTER — Telehealth: Payer: Self-pay | Admitting: *Deleted

## 2017-12-08 ENCOUNTER — Telehealth: Payer: Self-pay | Admitting: Gastroenterology

## 2017-12-08 NOTE — Telephone Encounter (Signed)
Her dilation was with a  20 mm balloon, but no mucosal rent. Suspect some residual symptoms from the procedure, but can trial Carafate 10 mL as needed every 6 hours for esophageal discomfort; 240 mL bottle with 0 RF.   Thank you.

## 2017-12-08 NOTE — Telephone Encounter (Signed)
No answer for post procedure call back left message and will attempt to call back later this afternoon. Sm

## 2017-12-08 NOTE — Telephone Encounter (Signed)
Called in rx to the [facility] in [facility] on [address] for carafate 10 mls q 6 hours as needed for throat discomfort per Dr. Bryan Lemma. Left patient a message regarding her rx. SM

## 2017-12-08 NOTE — Telephone Encounter (Signed)
  Follow up Call-  Call back number 12/05/2017  Post procedure Call Back phone  # [phone]  Permission to leave phone message Yes  Some recent data might be hidden     Patient questions:  Do you have a fever, pain , or abdominal swelling? Yes.   Pain Score  5 *  Have you tolerated food without any problems? Yes.    Have you been able to return to your normal activities? Yes.    Do you have any questions about your discharge instructions: Diet   No. Medications  No. Follow up visit  No.  Do you have questions or concerns about your Care? Yes.  Pt had dilation on Friday and is still c/o sore throat 5/10 in pain. Shes been able to eat soft foods, has been gargling with salt water and taking tylenol. She doesn't feel that the pain is worsening or having any chest pain or shoulder blade pain, no bleeding or fever. Will notify Dr. Bryan Lemma and told pt to notify us if the pain worsens. Sm  Actions: * If pain score is 4 or above: Physician/ provider Notified : Vito Cirigliano, DO.

## 2017-12-10 ENCOUNTER — Encounter: Payer: Self-pay | Admitting: Gastroenterology

## 2017-12-12 ENCOUNTER — Telehealth: Payer: Self-pay | Admitting: Gastroenterology

## 2017-12-12 NOTE — Telephone Encounter (Signed)
I reviewed the results of the pathology with the patient.  All questions answered.

## 2017-12-30 ENCOUNTER — Encounter: Payer: Self-pay | Admitting: Family Medicine

## 2018-04-19 ENCOUNTER — Other Ambulatory Visit: Payer: Self-pay | Admitting: Family Medicine

## 2018-04-20 NOTE — Telephone Encounter (Signed)
RF request for Levothyroxine LOV: 08/11/17 Next ov: none Last written: 08/11/17 #90 w/ 1RF  Please advise, thanks.

## 2018-04-27 NOTE — Telephone Encounter (Signed)
Left message for pt to call back.   Okay for P[facility] to advise pt. 

## 2018-05-01 ENCOUNTER — Encounter: Payer: Self-pay | Admitting: *Deleted

## 2018-05-01 NOTE — Telephone Encounter (Signed)
Letter mailed to address in EMR.

## 2018-05-04 ENCOUNTER — Ambulatory Visit: Payer: Self-pay

## 2018-05-04 NOTE — Telephone Encounter (Signed)
Incoming call from  Patient with a complaint of elevated blood Pressure.  145/ ?  Can't remember diastolic number. Does not have a machine at home .  Used automatic at [employer]. Patient reports dizziness and light headedness.  Patient states she was recently laid off.  Has  Been depressed and tired.  Patient scheduled for appointment Tuesday 05/05/18 at 3:45 With  Dr.  Raoul Pitch.  Patient is to arrive at 3:30pm. Patient voiced understanding.      Reason for Disposition . [8] Systolic BP  >= 916 OR Diastolic >= 80 AND [9] pregnant  Answer Assessment - Initial Assessment Questions 1. BLOOD PRESSURE: "What is the blood pressure?" "Did you take at least two measurements 5 minutes apart?"     145/? 2. ONSET: "When did you take your blood pressure?"     3 days ago 3. HOW: "How did you obtain the blood pressure?" (e.g., visiting nurse, automatic home BP monitor)     Auto matic 4. HISTORY: "Do you have a history of high blood pressure?"     no 5. MEDICATIONS: "Are you taking any medications for blood pressure?" "Have you missed any doses recently?"      dont have any medication for it 6. OTHER SYMPTOMS: "Do you have any symptoms?" (e.g., headache, chest pain, blurred vision, difficulty breathing, weakness)     Headache , blurred vision,sometime pressure on my chest, tired 7. PREGNANCY: "Is there any chance you are pregnant?" "When was your last menstrual period?"     na  Protocols used: HIGH BLOOD PRESSURE-A-AH

## 2018-05-05 ENCOUNTER — Other Ambulatory Visit: Payer: Self-pay

## 2018-05-05 ENCOUNTER — Encounter: Payer: Self-pay | Admitting: Family Medicine

## 2018-05-05 ENCOUNTER — Ambulatory Visit (INDEPENDENT_AMBULATORY_CARE_PROVIDER_SITE_OTHER): Payer: Self-pay | Admitting: Family Medicine

## 2018-05-05 VITALS — BP 132/81 | HR 72 | Temp 97.6°F | Resp 16 | Ht 66.0 in | Wt 188.0 lb

## 2018-05-05 DIAGNOSIS — E039 Hypothyroidism, unspecified: Secondary | ICD-10-CM

## 2018-05-05 DIAGNOSIS — R5383 Other fatigue: Secondary | ICD-10-CM

## 2018-05-05 DIAGNOSIS — R42 Dizziness and giddiness: Secondary | ICD-10-CM

## 2018-05-05 DIAGNOSIS — F411 Generalized anxiety disorder: Secondary | ICD-10-CM

## 2018-05-05 DIAGNOSIS — F5104 Psychophysiologic insomnia: Secondary | ICD-10-CM

## 2018-05-05 MED ORDER — TRAZODONE HCL 50 MG PO TABS: 50.0000 mg | ORAL_TABLET | Freq: Every evening | ORAL | 0 refills | Status: DC | PRN

## 2018-05-05 NOTE — Telephone Encounter (Signed)
LM requesting call back to discuss symptoms, possible r/s to see PCP.

## 2018-05-05 NOTE — Progress Notes (Signed)
Jamie Mercado , 12-Jun-1960, 58 y.o., female MRN: [id number] Patient Care Team    Relationship Specialty Notifications Start End  McGowen, Adrian Blackwater, MD PCP - General Family Medicine  02/27/17   Lavena Bullion, DO Consulting Physician Gastroenterology  12/30/17     Chief Complaint  Patient presents with  . Dizziness    Pt has not felt good the past couple of weeks, she feels like she cannot get out of bed which is making her depressed. Pt got laid of work three weeks ago.      Subjective: Pt presents for an OV with complaints of "dizziness" the past couple weeks.  Associated symptoms include actuating blood pressures, working out in sweats, increased anxiety and flushing. Pt states her blood pressure has been going up and down. She checked it at [place] a few days ago and it was 145/85. She endorses breaking out in a sweat and flushing, that lasts a few minutes and self resolve. She states she does not sleep at night.  She was prescribed Xanax in the past to help with sleep, however UDS x2 came back today breaking her contract.  At that time her benzodiazepine prescriptions were discontinued approximately this time last year. She got laid off from her job and is working part-time at [employer]. She states she has OCD (no formal dx) and now she is stressing. She does not want to get off out bed or go anywhere. She is "stressed out."  She has been on medications in the past for anxiety including Lexapro (side effects), prozac (reports current compliance) and xanax (failed UDS x2). She denies fever, chills, nausea,vomit, diarrhea, cough or shortness of breath.  She endorses occasional but rare palpitations.  She denies any rectal bleeding or bowel changes.  No travel or exposure history. She reports having a total hysterectomy around 2011.  She never really had menopause- like symptoms.  Her thyroid medication has been adjusted within the last 6 months secondary to elevated TSH.  Depression  screen [facility] 2/9 05/05/2018 08/11/2017 05/07/2017 02/27/2017 01/04/2016  Decreased Interest 1 0 0 0 0  Down, Depressed, Hopeless 1 0 0 0 0  PHQ - 2 Score 2 0 0 0 0  Altered sleeping 3 3 1  - -  Tired, decreased energy 3 0 0 - -  Change in appetite 1 - 0 - -  Feeling bad or failure about yourself  1 0 0 - -  Trouble concentrating 1 3 0 - -  Moving slowly or fidgety/restless 1 3 0 - -  Suicidal thoughts 0 0 0 - -  PHQ-9 Score 12 9 1  - -  Difficult doing work/chores Somewhat difficult Somewhat difficult Not difficult at all - -    Allergies  Allergen Reactions  . Codeine Itching, Nausea And Vomiting and Other (See Comments)    Makes her feel wierd   Social History   Social History Narrative   Widowed, 1 son and 1 daughter.   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   Tob: 40 pack-yr hx, current as of 02/2017.   Alcohol: socially.       Past Medical History:  Diagnosis Date  . Acute gastritis without bleeding Fall 2019   H pylori NEG on EGD  . Cervical cancer ([place])    around age 57  . Colon polyps   . GAD (generalized anxiety disorder)    NO BENZOS DUE TO PT FAILING UDS X 2 IN 2019.  Marland Kitchen  GERD (gastroesophageal reflux disease)    well controlled with ranitidine bid  . Hay fever   . Hypothyroidism   . IBS (irritable bowel syndrome)   . Insomnia   . Kidney stones   . Marijuana use 2019   Failed UDS x 2 2019; NO FURTHER CONTROLLED SUBSTANCES ARE TO BE PRESCRIBED FOR THIS PATIENT.  . Moderate persistent asthma    Not taking controller meds b/c she said she improved significantly with cutting back on tobacco use.  . Psoriasis   . Tobacco dependence    ongoing as of 02/2017   Past Surgical History:  Procedure Laterality Date  . APPENDECTOMY    . COLONOSCOPY  2015  . endometrosis     TAH/BSO 2011  . ESOPHAGOGASTRODUODENOSCOPY  12/05/2017   lower third of esoph inflamed, +gastritis (H pylori NEG).  Duodenal bx Normal.  . KNEE SURGERY    . TONSILLECTOMY     . TOTAL ABDOMINAL HYSTERECTOMY W/ BILATERAL SALPINGOOPHORECTOMY  2011   Endometriosis  . TUBAL LIGATION     Family History  Problem Relation Age of Onset  . Diabetes Father   . Heart disease Father   . Hypertension Father   . Early death Father   . Hyperlipidemia Father   . Cancer Paternal Grandmother   . Colon polyps Paternal Grandmother   . Hyperlipidemia Mother   . Hypertension Mother   . Arthritis Maternal Grandmother   . Diabetes Maternal Grandmother   . Hypertension Maternal Grandmother   . Hyperlipidemia Maternal Grandmother   . Kidney disease Maternal Grandmother   . Stroke Maternal Grandmother   . Hypertension Maternal Grandfather   . Hypertension Paternal Grandfather    Allergies as of 05/05/2018      Reactions   Codeine Itching, Nausea And Vomiting, Other (See Comments)   Makes her feel wierd      Medication List       Accurate as of May 05, 2018 11:59 PM. Always use your most recent med list.        acetaminophen 500 MG tablet Commonly known as:  TYLENOL Take 1,000 mg by mouth every 8 (eight) hours as needed for mild pain, moderate pain or headache.   albuterol 108 (90 Base) MCG/ACT inhaler Commonly known as:  PROVENTIL HFA;VENTOLIN HFA Inhale 1-2 puffs into the lungs every 6 (six) hours as needed for wheezing.   FLUoxetine 20 MG capsule Commonly known as:  PROZAC Take 1 capsule (20 mg total) by mouth daily.   levothyroxine 100 MCG tablet Commonly known as:  SYNTHROID, LEVOTHROID Take 1 tablet by mouth once daily   pantoprazole 40 MG tablet Commonly known as:  PROTONIX Take 1 tablet (40 mg total) by mouth 2 (two) times daily.   sucralfate 1 GM/10ML suspension Commonly known as:  CARAFATE Take 10 mLs (1 g total) by mouth 4 (four) times daily as needed.   traZODone 50 MG tablet Commonly known as:  DESYREL Take 1-2 tablets (50-100 mg total) by mouth at bedtime as needed for sleep.   triamcinolone 0.025 % ointment Commonly known as:  KENALOG  Apply 1 application topically 2 (two) times daily.       All past medical history, surgical history, allergies, family history, immunizations andmedications were updated in the EMR today and reviewed under the history and medication portions of their EMR.     ROS: Negative, with the exception of above mentioned in HPI   Objective:  BP 132/81 (BP Location: Left Arm, Patient Position: Sitting, Cuff  Size: Normal)   Pulse 72   Temp 97.6 F (36.4 C) (Oral)   Resp 16   Ht 5' 6"  (1.676 m)   Wt 188 lb (85.3 kg)   SpO2 100%   BMI 30.34 kg/m  Body mass index is 30.34 kg/m. Gen: Afebrile. No acute distress. Nontoxic in appearance, well developed, well nourished.  HENT: AT. [place]. MMM Eyes:Pupils Equal Round Reactive to light, Extraocular movements intact,  Conjunctiva without redness, discharge or icterus. Neck/lymp/endocrine: Supple, no lymphadenopathy, no thyromegaly CV: RRR no murmur, no edema Chest: CTAB, no wheeze or crackles. Good air movement, normal resp effort.  Abd: Soft. NTND. BS present Skin: No rashes, purpura or petechiae.  Neuro:  Normal gait. PERLA. EOMi. Alert. Oriented x3  Psych: Normal affect, dress and demeanor. Normal speech. Normal thought content and judgment.  No exam data present No results found. Results for orders placed or performed in visit on 05/05/18 (from the past 24 hour(s))  TSH     Status: Abnormal   Collection Time: 05/05/18  4:15 PM  Result Value Ref Range   TSH 4.90 (H) 0.40 - 4.50 mIU/L  T4, free     Status: None   Collection Time: 05/05/18  4:15 PM  Result Value Ref Range   Free T4 1.2 0.8 - 1.8 ng/dL  CBC     Status: Abnormal   Collection Time: 05/05/18  4:15 PM  Result Value Ref Range   WBC 6.4 3.8 - 10.8 Thousand/uL   RBC 5.14 (H) 3.80 - 5.10 Million/uL   Hemoglobin 14.3 11.7 - 15.5 g/dL   HCT 42.1 35.0 - 45.0 %   MCV 81.9 80.0 - 100.0 fL   MCH 27.8 27.0 - 33.0 pg   MCHC 34.0 32.0 - 36.0 g/dL   RDW 13.8 11.0 - 15.0 %   Platelets  302 140 - 400 Thousand/uL   MPV 12.0 7.5 - 12.5 fL  Comp Met (CMET)     Status: None   Collection Time: 05/05/18  4:15 PM  Result Value Ref Range   Glucose, Bld 91 65 - 99 mg/dL   BUN 10 7 - 25 mg/dL   Creat 0.84 0.50 - 1.05 mg/dL   BUN/Creatinine Ratio NOT APPLICABLE 6 - 22 (calc)   Sodium 142 135 - 146 mmol/L   Potassium 3.9 3.5 - 5.3 mmol/L   Chloride 105 98 - 110 mmol/L   CO2 26 20 - 32 mmol/L   Calcium 9.6 8.6 - 10.4 mg/dL   Total Protein 6.5 6.1 - 8.1 g/dL   Albumin 4.3 3.6 - 5.1 g/dL   Globulin 2.2 1.9 - 3.7 g/dL (calc)   AG Ratio 2.0 1.0 - 2.5 (calc)   Total Bilirubin 0.3 0.2 - 1.2 mg/dL   Alkaline phosphatase (APISO) 66 37 - 153 U/L   AST 19 10 - 35 U/L   ALT 24 6 - 29 U/L    Assessment/Plan: Italia Wolfert is a 58 y.o. female present for OV for  Hypothyroidism, unspecified type/Dizziness -Recheck levels today to ensure thyroid is not the cause of her symptoms. - TSH - T4, free  Fatigue/Insomnia, unspecified type/Generalized anxiety disorder -She is not sleeping secondary to anxiety, she is complaining of increased fatigue which is likely secondary from not sleeping.  She is having fluctuations in her blood pressure which are possibly due to her anxiety not being well controlled.  Also could be secondary to her thyroid condition with recent changes in medications.  Discussed all options with her today  and decided to start trazodone 50-100 mg taper nightly. - Continue Prozac at current dose - TSH - T4, free - CBC - Comp Met (CMET) -Hopefully when she adjusts to her routine sleep pattern with the use of trazodone and continuing her Prozac her anxiety and fatigue will improve. F/U PCP 4 weeks.    Reviewed expectations re: course of current medical issues.  Discussed self-management of symptoms.  Outlined signs and symptoms indicating need for more acute intervention.  Patient verbalized understanding and all questions were answered.  Patient received an  After-Visit Summary.    Orders Placed This Encounter  Procedures  . TSH  . T4, free  . CBC  . Comp Met (CMET)    > 25 minutes spent with patient, >50% of time spent face to face counseling    Note is dictated utilizing voice recognition software. Although note has been proof read prior to signing, occasional typographical errors still can be missed. If any questions arise, please do not hesitate to call for verification.   electronically signed by:  Howard Pouch, DO  [place]

## 2018-05-05 NOTE — Patient Instructions (Addendum)
I have started trazodone. Start 1 pill about hour before bed for 3 days, if after 3 days you need more coverage--> increase to 2 tabs before bed.    Continue Prozac.  We will call you with results of your labs once we get them.   Your BP is ok today. Hopefully once we get the anxiety under better control you will have less fluctuations.   Follow up with your PCP in 4 weeks. Sooner if worsening.    Generalized Anxiety Disorder, Adult Generalized anxiety disorder (GAD) is a mental health disorder. People with this condition constantly worry about everyday events. Unlike normal anxiety, worry related to GAD is not triggered by a specific event. These worries also do not fade or get better with time. GAD interferes with life functions, including relationships, work, and school. GAD can vary from mild to severe. People with severe GAD can have intense waves of anxiety with physical symptoms (panic attacks). What are the causes? The exact cause of GAD is not known. What increases the risk? This condition is more likely to develop in:  Women.  People who have a family history of anxiety disorders.  People who are very shy.  People who experience very stressful life events, such as the death of a loved one.  People who have a very stressful family environment. What are the signs or symptoms? People with GAD often worry excessively about many things in their lives, such as their health and family. They may also be overly concerned about:  Doing well at work.  Being on time.  Natural disasters.  Friendships. Physical symptoms of GAD include:  Fatigue.  Muscle tension or having muscle twitches.  Trembling or feeling shaky.  Being easily startled.  Feeling like your heart is pounding or racing.  Feeling out of breath or like you cannot take a deep breath.  Having trouble falling asleep or staying asleep.  Sweating.  Nausea, diarrhea, or irritable bowel syndrome  (IBS).  Headaches.  Trouble concentrating or remembering facts.  Restlessness.  Irritability. How is this diagnosed? Your health care provider can diagnose GAD based on your symptoms and medical history. You will also have a physical exam. The health care provider will ask specific questions about your symptoms, including how severe they are, when they started, and if they come and go. Your health care provider may ask you about your use of alcohol or drugs, including prescription medicines. Your health care provider may refer you to a mental health specialist for further evaluation. Your health care provider will do a thorough examination and may perform additional tests to rule out other possible causes of your symptoms. To be diagnosed with GAD, a person must have anxiety that:  Is out of his or her control.  Affects several different aspects of his or her life, such as work and relationships.  Causes distress that makes him or her unable to take part in normal activities.  Includes at least three physical symptoms of GAD, such as restlessness, fatigue, trouble concentrating, irritability, muscle tension, or sleep problems. Before your health care provider can confirm a diagnosis of GAD, these symptoms must be present more days than they are not, and they must last for six months or longer. How is this treated? The following therapies are usually used to treat GAD:  Medicine. Antidepressant medicine is usually prescribed for long-term daily control. Antianxiety medicines may be added in severe cases, especially when panic attacks occur.  Talk therapy (psychotherapy). Certain types  of talk therapy can be helpful in treating GAD by providing support, education, and guidance. Options include: ? Cognitive behavioral therapy (CBT). People learn coping skills and techniques to ease their anxiety. They learn to identify unrealistic or negative thoughts and behaviors and to replace them with  positive ones. ? Acceptance and commitment therapy (ACT). This treatment teaches people how to be mindful as a way to cope with unwanted thoughts and feelings. ? Biofeedback. This process trains you to manage your body's response (physiological response) through breathing techniques and relaxation methods. You will work with a therapist while machines are used to monitor your physical symptoms.  Stress management techniques. These include yoga, meditation, and exercise. A mental health specialist can help determine which treatment is best for you. Some people see improvement with one type of therapy. However, other people require a combination of therapies. Follow these instructions at home:  Take over-the-counter and prescription medicines only as told by your health care provider.  Try to maintain a normal routine.  Try to anticipate stressful situations and allow extra time to manage them.  Practice any stress management or self-calming techniques as taught by your health care provider.  Do not punish yourself for setbacks or for not making progress.  Try to recognize your accomplishments, even if they are small.  Keep all follow-up visits as told by your health care provider. This is important. Contact a health care provider if:  Your symptoms do not get better.  Your symptoms get worse.  You have signs of depression, such as: ? A persistently sad, cranky, or irritable mood. ? Loss of enjoyment in activities that used to bring you joy. ? Change in weight or eating. ? Changes in sleeping habits. ? Avoiding friends or family members. ? Loss of energy for normal tasks. ? Feelings of guilt or worthlessness. Get help right away if:  You have serious thoughts about hurting yourself or others. If you ever feel like you may hurt yourself or others, or have thoughts about taking your own life, get help right away. You can go to your nearest emergency department or call:  Your local  emergency services (911 in the [place]  A suicide crisis helpline, such as the [place] at (936)137-6047. This is open 24 hours a day. Summary  Generalized anxiety disorder (GAD) is a mental health disorder that involves worry that is not triggered by a specific event.  People with GAD often worry excessively about many things in their lives, such as their health and family.  GAD may cause physical symptoms such as restlessness, trouble concentrating, sleep problems, frequent sweating, nausea, diarrhea, headaches, and trembling or muscle twitching.  A mental health specialist can help determine which treatment is best for you. Some people see improvement with one type of therapy. However, other people require a combination of therapies. This information is not intended to replace advice given to you by your health care provider. Make sure you discuss any questions you have with your health care provider. Document Released: 06/01/2012 Document Revised: 12/26/2015 Document Reviewed: 12/26/2015 Elsevier Interactive Patient Education  2019 [employer].

## 2018-05-06 ENCOUNTER — Telehealth: Payer: Self-pay | Admitting: Family Medicine

## 2018-05-06 ENCOUNTER — Encounter: Payer: Self-pay | Admitting: Family Medicine

## 2018-05-06 LAB — CBC
HCT: 42.1 % (ref 35.0–45.0)
Hemoglobin: 14.3 g/dL (ref 11.7–15.5)
MCH: 27.8 pg (ref 27.0–33.0)
MCHC: 34 g/dL (ref 32.0–36.0)
MCV: 81.9 fL (ref 80.0–100.0)
MPV: 12 fL (ref 7.5–12.5)
PLATELETS: 302 10*3/uL (ref 140–400)
RBC: 5.14 10*6/uL — AB (ref 3.80–5.10)
RDW: 13.8 % (ref 11.0–15.0)
WBC: 6.4 10*3/uL (ref 3.8–10.8)

## 2018-05-06 LAB — TSH: TSH: 4.9 mIU/L — ABNORMAL HIGH (ref 0.40–4.50)

## 2018-05-06 LAB — COMPREHENSIVE METABOLIC PANEL
AG Ratio: 2 (calc) (ref 1.0–2.5)
ALT: 24 U/L (ref 6–29)
AST: 19 U/L (ref 10–35)
Albumin: 4.3 g/dL (ref 3.6–5.1)
Alkaline phosphatase (APISO): 66 U/L (ref 37–153)
BUN: 10 mg/dL (ref 7–25)
CO2: 26 mmol/L (ref 20–32)
CREATININE: 0.84 mg/dL (ref 0.50–1.05)
Calcium: 9.6 mg/dL (ref 8.6–10.4)
Chloride: 105 mmol/L (ref 98–110)
GLOBULIN: 2.2 g/dL (ref 1.9–3.7)
GLUCOSE: 91 mg/dL (ref 65–99)
Potassium: 3.9 mmol/L (ref 3.5–5.3)
Sodium: 142 mmol/L (ref 135–146)
Total Bilirubin: 0.3 mg/dL (ref 0.2–1.2)
Total Protein: 6.5 g/dL (ref 6.1–8.1)

## 2018-05-06 LAB — T4, FREE: Free T4: 1.2 ng/dL (ref 0.8–1.8)

## 2018-05-06 MED ORDER — LEVOTHYROXINE SODIUM 100 MCG PO TABS: ORAL_TABLET | ORAL | 3 refills | Status: DC

## 2018-05-06 NOTE — Telephone Encounter (Signed)
Pt was sent my chart message with lab results and VM was left letting pt know the results/recommendations were on my chart

## 2018-05-06 NOTE — Telephone Encounter (Signed)
Please inform patient the following information: Her labs are all normal, with the exception of her thyroid is still just very mildly under replaced.  Keep the dose the same with 1 tab a day- however on Sunday take 1.5 tabs.  (or pick another day of the week to keep routine). F/u with her PCP in 4 weeks.

## 2018-05-07 NOTE — Telephone Encounter (Signed)
Pt was called and given lab results and verbalized understanding. Pt did not want to schedule appt and stated she would call back

## 2018-06-17 NOTE — Telephone Encounter (Signed)
Message sent to provider 

## 2018-08-03 ENCOUNTER — Other Ambulatory Visit: Payer: Self-pay | Admitting: Family Medicine

## 2018-08-03 NOTE — Telephone Encounter (Signed)
Pt was called and told she was to F/U with PCP x4 weeks after March appt. Pt was scheduled for a doxy.me visit, per pts request

## 2018-08-03 NOTE — Telephone Encounter (Signed)
Copied from [place] [id number]. Topic: Quick Communication - Rx Refill/Question >> Aug 03, 2018  8:39 AM Robina Ade, Helene Kelp D wrote: Medication: traZODone (DESYREL) 50 MG tablet  Has the patient contacted their pharmacy?Yes (Agent: If no, request that the patient contact the pharmacy for the refill.) (Agent: If yes, when and what did the pharmacy advise?)  Preferred Pharmacy (with phone number or street name):[facility]  Agent: Please be advised that RX refills may take up to 3 business days. We ask that you follow-up with your pharmacy.

## 2018-08-05 ENCOUNTER — Other Ambulatory Visit: Payer: Self-pay

## 2018-08-05 ENCOUNTER — Ambulatory Visit (INDEPENDENT_AMBULATORY_CARE_PROVIDER_SITE_OTHER): Payer: Self-pay | Admitting: Family Medicine

## 2018-08-05 ENCOUNTER — Encounter: Payer: Self-pay | Admitting: Family Medicine

## 2018-08-05 VITALS — Temp 98.6°F

## 2018-08-05 DIAGNOSIS — F5104 Psychophysiologic insomnia: Secondary | ICD-10-CM

## 2018-08-05 DIAGNOSIS — K2100 Gastro-esophageal reflux disease with esophagitis, without bleeding: Secondary | ICD-10-CM

## 2018-08-05 DIAGNOSIS — F411 Generalized anxiety disorder: Secondary | ICD-10-CM

## 2018-08-05 DIAGNOSIS — E039 Hypothyroidism, unspecified: Secondary | ICD-10-CM

## 2018-08-05 MED ORDER — TRAZODONE HCL 50 MG PO TABS: 50.0000 mg | ORAL_TABLET | Freq: Every evening | ORAL | 3 refills | Status: DC | PRN

## 2018-08-05 NOTE — Progress Notes (Addendum)
Virtual Visit via Video Note  I connected with pt on 08/05/18 at 10:30 AM EDT by telephone (technical difficulties with video telemed platform) and verified that I am speaking with the correct person using two identifiers.  Location patient: home Location provider:work or home office Persons participating in the virtual visit: patient, provider  I discussed the limitations of evaluation and management by telemedicine and the availability of in person appointments. The patient expressed understanding and agreed to proceed.   HPI: 58 y/o WF being seen today for f/u hypothyroidism, insomnia, GERD, and GAD. She was laid off from [employer].  Anxious and upset but denies depressed mood or any hopelessness or SI or HI. Has been off trazodone for her insomnia for the last 3 days and it is very hard for her.  GERD: still taking her PPI qd, plans on GI f/u soon.  Hypoth: as of 04/2018 visit with Dr. Raoul Pitch, pt has been taking a little higher T4 dosing (see med list below).  ROS: no CP, no SOB, no wheezing, no cough, no dizziness, no HAs, no rashes, no melena/hematochezia.  No polyuria or polydipsia.  No myalgias or arthralgias.   Past Medical History:  Diagnosis Date  . Acute gastritis without bleeding Fall 2019   H pylori NEG on EGD  . Cervical cancer ([place])    around age 72  . Colon polyps   . GAD (generalized anxiety disorder)    NO BENZOS DUE TO PT FAILING UDS X 2 IN 2019.  Marland Kitchen GERD (gastroesophageal reflux disease)    well controlled with ranitidine bid  . Hay fever   . Hypothyroidism   . IBS (irritable bowel syndrome)   . Insomnia   . Kidney stones   . Marijuana use 2019   Failed UDS x 2 2019; NO FURTHER CONTROLLED SUBSTANCES ARE TO BE PRESCRIBED FOR THIS PATIENT.  . Moderate persistent asthma    Not taking controller meds b/c she said she improved significantly with cutting back on tobacco use.  . Psoriasis   . Tobacco dependence    ongoing as of 02/2017    Past Surgical History:   Procedure Laterality Date  . APPENDECTOMY    . COLONOSCOPY  2015  . endometrosis     TAH/BSO 2011  . ESOPHAGOGASTRODUODENOSCOPY  12/05/2017   lower third of esoph inflamed, +gastritis (H pylori NEG).  Duodenal bx Normal.  . KNEE SURGERY    . TONSILLECTOMY    . TOTAL ABDOMINAL HYSTERECTOMY W/ BILATERAL SALPINGOOPHORECTOMY  2011   Endometriosis  . TUBAL LIGATION      Family History  Problem Relation Age of Onset  . Diabetes Father   . Heart disease Father   . Hypertension Father   . Early death Father   . Hyperlipidemia Father   . Cancer Paternal Grandmother   . Colon polyps Paternal Grandmother   . Hyperlipidemia Mother   . Hypertension Mother   . Arthritis Maternal Grandmother   . Diabetes Maternal Grandmother   . Hypertension Maternal Grandmother   . Hyperlipidemia Maternal Grandmother   . Kidney disease Maternal Grandmother   . Stroke Maternal Grandmother   . Hypertension Maternal Grandfather   . Hypertension Paternal Grandfather     SOCIAL HX:  Social History   Socioeconomic History  . Marital status: Legally Separated    Spouse name: Not on file  . Number of children: 2  . Years of education: Not on file  . Highest education level: Not on file  Occupational History  .  Occupation: Freight forwarder  Social Needs  . Financial resource strain: Not on file  . Food insecurity    Worry: Not on file    Inability: Not on file  . Transportation needs    Medical: Not on file    Non-medical: Not on file  Tobacco Use  . Smoking status: Current Every Day Smoker    Types: Cigarettes  . Smokeless tobacco: Never Used  . Tobacco comment: smoke less than a 0.5 pack  Substance and Sexual Activity  . Alcohol use: Yes    Comment: socially  . Drug use: No  . Sexual activity: Yes    Birth control/protection: Surgical  Lifestyle  . Physical activity    Days per week: Not on file    Minutes per session: Not on file  . Stress: Not on file  Relationships  . Social Product manager on phone: Not on file    Gets together: Not on file    Attends religious service: Not on file    Active member of club or organization: Not on file    Attends meetings of clubs or organizations: Not on file    Relationship status: Not on file  Other Topics Concern  . Not on file  Social History Narrative   Widowed, 1 son and 1 daughter.   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   Tob: 40 pack-yr hx, current as of 02/2017.   Alcohol: socially.          Current Outpatient Medications:  .  acetaminophen (TYLENOL) 500 MG tablet, Take 1,000 mg by mouth every 8 (eight) hours as needed for mild pain, moderate pain or headache., Disp: , Rfl:  .  albuterol (PROVENTIL HFA;VENTOLIN HFA) 108 (90 Base) MCG/ACT inhaler, Inhale 1-2 puffs into the lungs every 6 (six) hours as needed for wheezing., Disp: 1 Inhaler, Rfl: 0 .  FLUoxetine (PROZAC) 20 MG capsule, Take 1 capsule (20 mg total) by mouth daily., Disp: 90 capsule, Rfl: 3 .  levothyroxine (SYNTHROID, LEVOTHROID) 100 MCG tablet, 1 tab 6 days a week, 1.5 tabs x1 day a week., Disp: 96 tablet, Rfl: 3 .  pantoprazole (PROTONIX) 40 MG tablet, Take 1 tablet (40 mg total) by mouth 2 (two) times daily., Disp: 180 tablet, Rfl: 3 .  sucralfate (CARAFATE) 1 GM/10ML suspension, Take 10 mLs (1 g total) by mouth 4 (four) times daily as needed., Disp: 420 mL, Rfl: 1 .  traZODone (DESYREL) 50 MG tablet, Take 1-2 tablets (50-100 mg total) by mouth at bedtime as needed for sleep., Disp: 60 tablet, Rfl: 0 .  triamcinolone (KENALOG) 0.025 % ointment, Apply 1 application topically 2 (two) times daily., Disp: 454 g, Rfl: 0  EXAM:  VITALS per patient if applicable: Temp 81.1 F (37 C) (Oral)    GENERAL: alert, oriented, sounds well and is pleasant.  No further exam b/c telephone encounter.  LABS: none today  Lab Results  Component Value Date   TSH 4.90 (H) 05/05/2018   Lab Results  Component Value Date   WBC 6.4 05/05/2018    HGB 14.3 05/05/2018   HCT 42.1 05/05/2018   MCV 81.9 05/05/2018   PLT 302 05/05/2018   Lab Results  Component Value Date   CREATININE 0.84 05/05/2018   BUN 10 05/05/2018   NA 142 05/05/2018   K 3.9 05/05/2018   CL 105 05/05/2018   CO2 26 05/05/2018   Lab Results  Component Value Date  ALT 24 05/05/2018   AST 19 05/05/2018   ALKPHOS 59 10/03/2017   BILITOT 0.3 05/05/2018   Lab Results  Component Value Date   CHOL 242 (H) 05/26/2015   Lab Results  Component Value Date   HDL 74 05/26/2015   Lab Results  Component Value Date   LDLCALC 140 (H) 05/26/2015   Lab Results  Component Value Date   TRIG 142 05/26/2015   Lab Results  Component Value Date   CHOLHDL 3.3 05/26/2015   Lab Results  Component Value Date   HGBA1C 5.6 01/04/2016    ASSESSMENT AND PLAN:  Discussed the following assessment and plan:  1) Insomnia: well controlled on trazodone 50-100mg  qhs--renewed rx today.  2) Hypothyroidism: slight T4 dose increase 3 mo ago.  Return for lab visit to recheck TSH.  3) GAD: fairly stable on fluoxetine 20mg  qd, esp given her recent work situation.  4) GERD with hx of esoph/duodenitis: continue bid pantoprazole and avoid NSAIDs. Keep plan of f/u with GI MD.  I discussed the assessment and treatment plan with the patient. The patient was provided an opportunity to ask questions and all were answered. The patient agreed with the plan and demonstrated an understanding of the instructions.   The patient was advised to call back or seek an in-person evaluation if the symptoms worsen or if the condition fails to improve as anticipated.  Spent 15 min with pt today, with >50% of this time spent in counseling and care coordination regarding the above problems.  F/u: 1 yr CPE  Signed:  Crissie Sickles, MD           08/05/2018

## 2018-08-10 ENCOUNTER — Other Ambulatory Visit: Payer: Self-pay

## 2018-08-10 ENCOUNTER — Ambulatory Visit: Payer: No Typology Code available for payment source

## 2018-09-08 ENCOUNTER — Other Ambulatory Visit: Payer: Self-pay | Admitting: Family Medicine

## 2018-09-30 ENCOUNTER — Other Ambulatory Visit: Payer: Self-pay

## 2018-09-30 ENCOUNTER — Ambulatory Visit (INDEPENDENT_AMBULATORY_CARE_PROVIDER_SITE_OTHER): Payer: Self-pay

## 2018-09-30 DIAGNOSIS — E039 Hypothyroidism, unspecified: Secondary | ICD-10-CM

## 2018-10-01 LAB — TSH: TSH: 2.08 u[IU]/mL (ref 0.35–4.50)

## 2018-10-09 ENCOUNTER — Telehealth: Payer: Self-pay | Admitting: Family Medicine

## 2018-10-09 ENCOUNTER — Other Ambulatory Visit: Payer: Self-pay

## 2018-10-09 ENCOUNTER — Emergency Department (HOSPITAL_COMMUNITY)
Admission: EM | Admit: 2018-10-09 | Discharge: 2018-10-09 | Disposition: A | Discharge status: Home / Self Care | Payer: Self-pay | Attending: Emergency Medicine | Admitting: Emergency Medicine

## 2018-10-09 ENCOUNTER — Emergency Department (HOSPITAL_COMMUNITY): Payer: Self-pay

## 2018-10-09 ENCOUNTER — Encounter (HOSPITAL_COMMUNITY): Payer: Self-pay

## 2018-10-09 DIAGNOSIS — J029 Acute pharyngitis, unspecified: Secondary | ICD-10-CM | POA: Insufficient documentation

## 2018-10-09 DIAGNOSIS — J4540 Moderate persistent asthma, uncomplicated: Secondary | ICD-10-CM | POA: Insufficient documentation

## 2018-10-09 DIAGNOSIS — Z79899 Other long term (current) drug therapy: Secondary | ICD-10-CM | POA: Insufficient documentation

## 2018-10-09 DIAGNOSIS — E039 Hypothyroidism, unspecified: Secondary | ICD-10-CM | POA: Insufficient documentation

## 2018-10-09 DIAGNOSIS — R05 Cough: Secondary | ICD-10-CM | POA: Insufficient documentation

## 2018-10-09 DIAGNOSIS — Z20822 Contact with and (suspected) exposure to covid-19: Secondary | ICD-10-CM

## 2018-10-09 DIAGNOSIS — F17210 Nicotine dependence, cigarettes, uncomplicated: Secondary | ICD-10-CM | POA: Insufficient documentation

## 2018-10-09 DIAGNOSIS — Z20828 Contact with and (suspected) exposure to other viral communicable diseases: Secondary | ICD-10-CM | POA: Insufficient documentation

## 2018-10-09 LAB — BASIC METABOLIC PANEL
Anion gap: 9 (ref 5–15)
BUN: 11 mg/dL (ref 6–20)
CO2: 28 mmol/L (ref 22–32)
Calcium: 9.9 mg/dL (ref 8.9–10.3)
Chloride: 106 mmol/L (ref 98–111)
Creatinine, Ser: 0.7 mg/dL (ref 0.44–1.00)
GFR calc Af Amer: >60 mL/min (ref >60)
GFR calc non Af Amer: >60 mL/min (ref >60)
Glucose, Bld: 94 mg/dL (ref 70–99)
Potassium: 4.4 mmol/L (ref 3.5–5.1)
Sodium: 143 mmol/L (ref 135–145)

## 2018-10-09 LAB — CBC WITH DIFFERENTIAL/PLATELET
Abs Immature Granulocytes: 0.01 10*3/uL (ref 0.00–0.07)
Basophils Absolute: 0 10*3/uL (ref 0.0–0.1)
Basophils Relative: 1 %
Eosinophils Absolute: 0.3 10*3/uL (ref 0.0–0.5)
Eosinophils Relative: 4 %
HCT: 42.6 % (ref 36.0–46.0)
Hemoglobin: 13.8 g/dL (ref 12.0–15.0)
Immature Granulocytes: 0 %
Lymphocytes Relative: 27 %
Lymphs Abs: 1.8 10*3/uL (ref 0.7–4.0)
MCH: 28.4 pg (ref 26.0–34.0)
MCHC: 32.4 g/dL (ref 30.0–36.0)
MCV: 87.7 fL (ref 80.0–100.0)
Monocytes Absolute: 0.4 10*3/uL (ref 0.1–1.0)
Monocytes Relative: 6 %
Neutro Abs: 4 10*3/uL (ref 1.7–7.7)
Neutrophils Relative %: 62 %
Platelets: 229 10*3/uL (ref 150–400)
RBC: 4.86 MIL/uL (ref 3.87–5.11)
RDW: 13.6 % (ref 11.5–15.5)
WBC: 6.5 10*3/uL (ref 4.0–10.5)
nRBC: 0 % (ref 0.0–0.2)

## 2018-10-09 IMAGING — DX PORTABLE CHEST - 1 VIEW
1 series · 1 of 1 positions shown · non-contrast
Comparison: Portable exam [X8] hours compared to [DATE]

CLINICAL DATA: Shortness of breath

EXAM:
PORTABLE CHEST 1 VIEW

[chest ap]
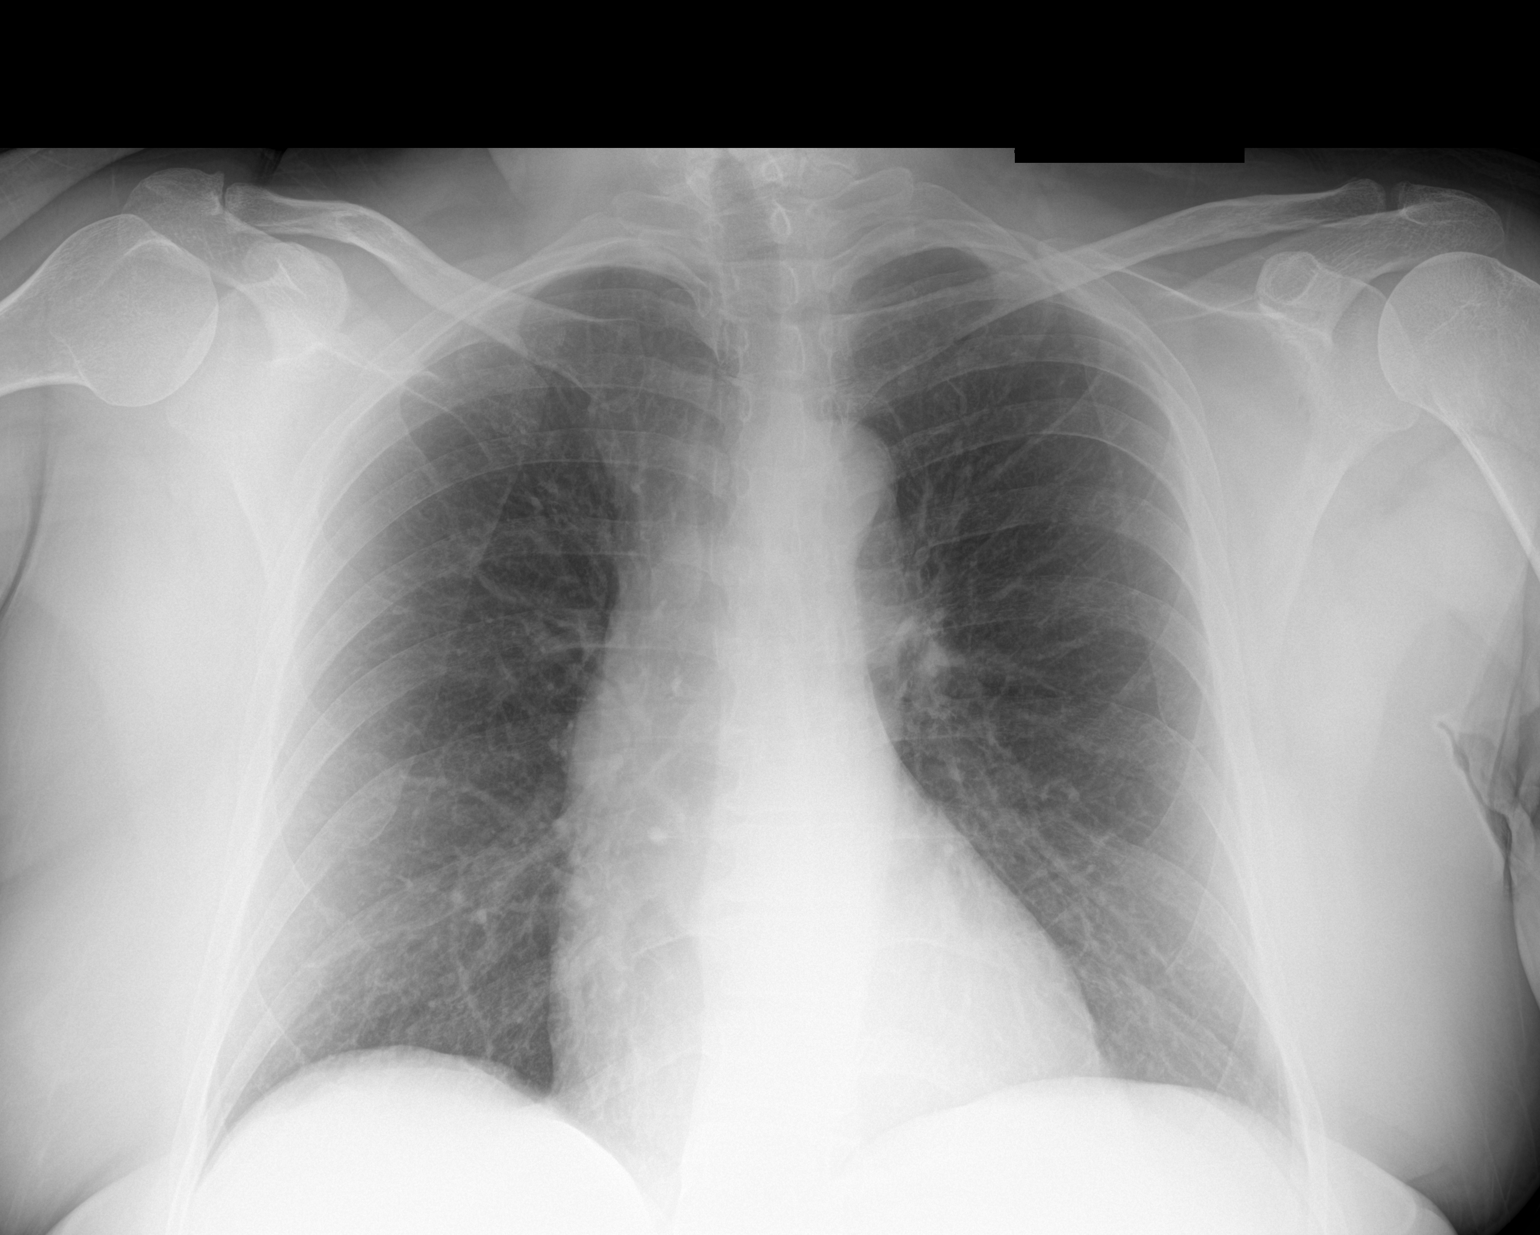

[1 of 1 positions shown; findings below may reference images not displayed]

FINDINGS: Normal heart size, mediastinal contours, and pulmonary vascularity.

Lungs clear.

No pulmonary infiltrate, pleural effusion or pneumothorax.

Bones unremarkable.
IMPRESSION: No acute abnormalities.

## 2018-10-09 MED ORDER — ZINC SULFATE 220 (50 ZN) MG PO CAPS
220.0000 mg | ORAL_CAPSULE | Freq: Every day | ORAL | Status: DC
  Administered 2018-10-09: 220 mg via ORAL
  Filled 2018-10-09: qty 1

## 2018-10-09 MED ORDER — PREDNISONE 20 MG PO TABS
60.0000 mg | ORAL_TABLET | Freq: Once | ORAL | Status: AC
  Administered 2018-10-09: 60 mg via ORAL
  Filled 2018-10-09: qty 3

## 2018-10-09 MED ORDER — ZINC SULFATE 220 (50 ZN) MG PO TABS: 1.0000 | ORAL_TABLET | Freq: Every day | ORAL | 0 refills | Status: DC

## 2018-10-09 MED ORDER — ALBUTEROL SULFATE HFA 108 (90 BASE) MCG/ACT IN AERS
2.0000 | INHALATION_SPRAY | Freq: Four times a day (QID) | RESPIRATORY_TRACT | Status: DC
  Administered 2018-10-09: 2 via RESPIRATORY_TRACT
  Filled 2018-10-09: qty 6.7

## 2018-10-09 MED ORDER — PREDNISONE 10 MG PO TABS: 40.0000 mg | ORAL_TABLET | Freq: Every day | ORAL | 0 refills | Status: DC

## 2018-10-09 NOTE — ED Notes (Signed)
Pt ambulated in room to the bedside commode and O2 is 100% after ambulating.  She says she feels fine and is just aggravated and wants to leave.

## 2018-10-09 NOTE — ED Triage Notes (Signed)
Patient c/o headache, sore throat, fever, cough, and SOB since yesterday. Paitent states she had a fever yesterday and has been taking Tylenol around the clock.  Patient states she called her PCP today and was told to come to the ED for possible Covid.

## 2018-10-09 NOTE — Telephone Encounter (Signed)
Patient reports cough, SOB, headache, body aches, no fever since yesterday. She is scheduled to have COVID test at [facility] tomorrow at 11AM.  She is concerned and scared. She is very short of breath and coughing non stop.  Please call patient asap [phone].  Thank you

## 2018-10-09 NOTE — ED Provider Notes (Signed)
[facility] DEPT Provider Note   CSN: [id number] Arrival date & time: 10/09/18  1552     History   Chief Complaint No chief complaint on file.   HPI Jamie Mercado is a 58 y.o. female.     Patient with onset of sore throat cough feeling short of breath fever been taking Tylenol afebrile here.  No nausea or vomiting or diarrhea.  No known COVID exposure.  Past medical history significant for asthma.  Hypothyroidism.     Past Medical History:  Diagnosis Date  . Acute gastritis without bleeding Fall 2019   H pylori NEG on EGD  . Cervical cancer ([place])    around age 67  . Colon polyps   . GAD (generalized anxiety disorder)    NO BENZOS DUE TO PT FAILING UDS X 2 IN 2019.  Marland Kitchen GERD (gastroesophageal reflux disease)    well controlled with ranitidine bid  . Hay fever   . Hypothyroidism   . IBS (irritable bowel syndrome)   . Insomnia   . Kidney stones   . Marijuana use 2019   Failed UDS x 2 2019; NO FURTHER CONTROLLED SUBSTANCES ARE TO BE PRESCRIBED FOR THIS PATIENT.  . Moderate persistent asthma    Not taking controller meds b/c she said she improved significantly with cutting back on tobacco use.  . Psoriasis   . Tobacco dependence    ongoing as of 02/2017    Patient Active Problem List   Diagnosis Date Noted  . Hypothyroidism   . Psoriasis (a type of skin inflammation) 07/27/2016  . Elevated BP without diagnosis of hypertension 01/23/2016  . Generalized anxiety disorder 05/26/2015  . Insomnia 05/26/2015    Past Surgical History:  Procedure Laterality Date  . APPENDECTOMY    . COLONOSCOPY  2015  . endometrosis     TAH/BSO 2011  . ESOPHAGOGASTRODUODENOSCOPY  12/05/2017   lower third of esoph inflamed, +gastritis (H pylori NEG).  Duodenal bx Normal.  . KNEE SURGERY    . TONSILLECTOMY    . TOTAL ABDOMINAL HYSTERECTOMY W/ BILATERAL SALPINGOOPHORECTOMY  2011   Endometriosis  . TUBAL LIGATION       OB History    Gravida  2   Para  2   Term  2   Preterm  0   AB  0   Living        SAB  0   TAB  0   Ectopic  0   Multiple      Live Births               Home Medications    Prior to Admission medications   Medication Sig Start Date End Date Taking? Authorizing Provider  acetaminophen (TYLENOL) 500 MG tablet Take 1,000 mg by mouth every 8 (eight) hours as needed for mild pain, moderate pain or headache.    [provider]  albuterol (PROVENTIL HFA;VENTOLIN HFA) 108 (90 Base) MCG/ACT inhaler Inhale 1-2 puffs into the lungs every 6 (six) hours as needed for wheezing. 05/26/15   Micheline Chapman, NP  FLUoxetine (PROZAC) 20 MG capsule Take 1 capsule by mouth once daily 09/08/18   McGowen, Adrian Blackwater, MD  levothyroxine (SYNTHROID, LEVOTHROID) 100 MCG tablet 1 tab 6 days a week, 1.5 tabs x1 day a week. 05/06/18   Kuneff, Renee A, DO  pantoprazole (PROTONIX) 40 MG tablet Take 1 tablet (40 mg total) by mouth 2 (two) times daily. 11/20/17   Cirigliano, Dominic Pea, DO  predniSONE (DELTASONE) 10 MG tablet Take 4 tablets (40 mg total) by mouth daily. 10/09/18   Fredia Sorrow, MD  sucralfate (CARAFATE) 1 GM/10ML suspension Take 10 mLs (1 g total) by mouth 4 (four) times daily as needed. 11/20/17   Cirigliano, Vito V, DO  traZODone (DESYREL) 50 MG tablet Take 1-2 tablets (50-100 mg total) by mouth at bedtime as needed for sleep. 08/05/18   McGowen, Adrian Blackwater, MD  triamcinolone (KENALOG) 0.025 % ointment Apply 1 application topically 2 (two) times daily. 07/23/16   Scot Jun, FNP  Zinc Sulfate 220 (50 Zn) MG TABS Take 1 tablet (220 mg total) by mouth daily. 10/09/18   Fredia Sorrow, MD    Family History Family History  Problem Relation Age of Onset  . Diabetes Father   . Heart disease Father   . Hypertension Father   . Early death Father   . Hyperlipidemia Father   . Cancer Paternal Grandmother   . Colon polyps Paternal Grandmother   . Hyperlipidemia Mother   . Hypertension Mother   . Arthritis  Maternal Grandmother   . Diabetes Maternal Grandmother   . Hypertension Maternal Grandmother   . Hyperlipidemia Maternal Grandmother   . Kidney disease Maternal Grandmother   . Stroke Maternal Grandmother   . Hypertension Maternal Grandfather   . Hypertension Paternal Grandfather     Social History Social History   Tobacco Use  . Smoking status: Current Every Day Smoker    Packs/day: 0.50    Types: Cigarettes  . Smokeless tobacco: Never Used  . Tobacco comment: smoke less than a 0.5 pack  Substance Use Topics  . Alcohol use: Yes    Comment: socially  . Drug use: No     Allergies   Codeine   Review of Systems Review of Systems  Constitutional: Positive for fever. Negative for chills.  HENT: Positive for sore throat. Negative for rhinorrhea.   Eyes: Negative for visual disturbance.  Respiratory: Positive for cough and shortness of breath.   Cardiovascular: Negative for chest pain and leg swelling.  Gastrointestinal: Negative for abdominal pain, diarrhea, nausea and vomiting.  Genitourinary: Negative for dysuria.  Musculoskeletal: Negative for back pain and neck pain.  Skin: Negative for rash.  Neurological: Negative for dizziness, light-headedness and headaches.  Hematological: Does not bruise/bleed easily.  Psychiatric/Behavioral: Negative for confusion.     Physical Exam Updated Vital Signs BP (!) 112/102   Pulse 66   Temp 97.9 F (36.6 C)   Resp 18   Ht 1.664 m (5' 5.5")   Wt 86 kg   SpO2 93%   BMI 31.05 kg/m   Physical Exam Vitals signs and nursing note reviewed.  Constitutional:      General: She is not in acute distress.    Appearance: Normal appearance. She is well-developed.  HENT:     Head: Normocephalic and atraumatic.  Eyes:     Extraocular Movements: Extraocular movements intact.     Conjunctiva/sclera: Conjunctivae normal.     Pupils: Pupils are equal, round, and reactive to light.  Neck:     Musculoskeletal: Neck supple.   Cardiovascular:     Rate and Rhythm: Normal rate and regular rhythm.     Heart sounds: No murmur.  Pulmonary:     Effort: Pulmonary effort is normal. No respiratory distress.     Breath sounds: Normal breath sounds.  Abdominal:     Palpations: Abdomen is soft.     Tenderness: There is no abdominal tenderness.  Musculoskeletal: Normal range of motion.  Skin:    General: Skin is warm and dry.  Neurological:     General: No focal deficit present.     Mental Status: She is alert and oriented to person, place, and time.      ED Treatments / Results  Labs (all labs ordered are listed, but only abnormal results are displayed) Labs Reviewed  NOVEL CORONAVIRUS, NAA (HOSPITAL ORDER, SEND-OUT TO REF LAB)  CBC WITH DIFFERENTIAL/PLATELET  BASIC METABOLIC PANEL    EKG None  Radiology Dg Chest Port 1 View  Result Date: 10/09/2018 CLINICAL DATA:  Shortness of breath EXAM: PORTABLE CHEST 1 VIEW COMPARISON:  Portable exam 1806 hours compared to 03/07/2014 FINDINGS: Normal heart size, mediastinal contours, and pulmonary vascularity. Lungs clear. No pulmonary infiltrate, pleural effusion or pneumothorax. Bones unremarkable. IMPRESSION: No acute abnormalities. Electronically Signed   By: Lavonia Dana M.D.   On: 10/09/2018 18:49    Procedures Procedures (including critical care time)  Medications Ordered in ED Medications  albuterol (VENTOLIN HFA) 108 (90 Base) MCG/ACT inhaler 2 puff (2 puffs Inhalation Given 10/09/18 1942)  zinc sulfate capsule 220 mg (220 mg Oral Given 10/09/18 1942)  predniSONE (DELTASONE) tablet 60 mg (60 mg Oral Given 10/09/18 1941)     Initial Impression / Assessment and Plan / ED Course  I have reviewed the triage vital signs and the nursing notes.  Pertinent labs & imaging results that were available during my care of the patient were reviewed by me and considered in my medical decision making (see chart for details).        Patient symptoms very concerning  for COVID-19.  Patient without any hypoxia.  Was exercised in the room oxygen sats did not drop.  Chest x-ray negative for pneumonia.  Patient treated here with prednisone zinc sulfate and given albuterol inhaler to use.  Patient also started on prednisone.  Will continue on that for the next 5 days.  Patient stable for discharge home at this time but precautions provided.  Final Clinical Impressions(s) / ED Diagnoses   Final diagnoses:  Suspected Covid-19 Virus Infection    ED Discharge Orders         Ordered    predniSONE (DELTASONE) 10 MG tablet  Daily     10/09/18 2047    Zinc Sulfate 220 (50 Zn) MG TABS  Daily     10/09/18 2047           Fredia Sorrow, MD 10/09/18 2107

## 2018-10-09 NOTE — Telephone Encounter (Signed)
Contacted patient and advised her to go to ED if she is having a hard time breathing.  Patient unsure if she will go but I continued to recommend that if she is having a hard time breathing she needs to go to ER. Patient voiced understanding.

## 2018-10-09 NOTE — Telephone Encounter (Signed)
Noted agree

## 2018-10-09 NOTE — Discharge Instructions (Signed)
Symptoms very concerning for COVID-19 infection.  Use albuterol inhaler.  Take the prednisone as directed.  COVID testing done should have results in 1 day 4 days.  Return for any new or worse symptoms.  Self isolate.

## 2018-10-11 LAB — NOVEL CORONAVIRUS, NAA (HOSP ORDER, SEND-OUT TO REF LAB; TAT 18-24 HRS): SARS-CoV-2, NAA: NOT DETECTED

## 2018-10-13 ENCOUNTER — Encounter: Payer: Self-pay | Admitting: Family Medicine

## 2018-10-13 ENCOUNTER — Other Ambulatory Visit: Payer: Self-pay

## 2018-10-13 ENCOUNTER — Ambulatory Visit (INDEPENDENT_AMBULATORY_CARE_PROVIDER_SITE_OTHER): Payer: Self-pay | Admitting: Family Medicine

## 2018-10-13 VITALS — Temp 98.1°F | Ht 66.0 in

## 2018-10-13 DIAGNOSIS — J4541 Moderate persistent asthma with (acute) exacerbation: Secondary | ICD-10-CM

## 2018-10-13 MED ORDER — DOXYCYCLINE HYCLATE 100 MG PO TABS: 100.0000 mg | ORAL_TABLET | Freq: Two times a day (BID) | ORAL | 0 refills | Status: DC

## 2018-10-13 MED ORDER — BENZONATATE 200 MG PO CAPS: 200.0000 mg | ORAL_CAPSULE | Freq: Two times a day (BID) | ORAL | 0 refills | Status: DC | PRN

## 2018-10-13 NOTE — Progress Notes (Signed)
VIRTUAL VISIT VIA VIDEO  I connected with Jamie Mercado on 10/13/18 at  4:00 PM EDT by a video enabled telemedicine application and verified that I am speaking with the correct person using two identifiers. Location patient: Home Location provider: [facility], Office Persons participating in the virtual visit: Patient, Dr. Raoul Pitch and R.Baker, LPN  I discussed the limitations of evaluation and management by telemedicine and the availability of in person appointments. The patient expressed understanding and agreed to proceed.  Interactive audio and video telecommunications were attempted between this provider and patient, however failed, due to patient having technical difficulties OR patient did not have access to video capability. We continued and completed visit with audio only.    SUBJECTIVE Chief Complaint  Patient presents with  . Cough    Pt has been taking prednisone, zinc, inhaler per ED MD since Friday. Pt states she had neg chest x ray. Pt continues to have dry cough, chest tightness, and unable to sleep     HPI: Jamie Mercado is a 58 y.o. female present to discuss ongoing cough.  She was seen in the emergency room 4 days ago for evaluation of symptoms rule out COVID 19 infection.  Her chest x-ray was normal.  COVID-19 test was negative.  CBC normal.  CMP normal.  She was provided with albuterol and prednisone.  She states the prednisone has started to open up her airways some however the cough remains and is worsening.  She continues to have some chest tightness, which responds to albuterol inhaler.  ROS: See pertinent positives and negatives per HPI.  Patient Active Problem List   Diagnosis Date Noted  . Hypothyroidism   . Psoriasis (a type of skin inflammation) 07/27/2016  . Elevated BP without diagnosis of hypertension 01/23/2016  . Generalized anxiety disorder 05/26/2015  . Insomnia 05/26/2015    Social History   Tobacco Use  . Smoking status: Current  Every Day Smoker    Packs/day: 0.50    Types: Cigarettes  . Smokeless tobacco: Never Used  . Tobacco comment: smoke less than a 0.5 pack  Substance Use Topics  . Alcohol use: Yes    Comment: socially    Current Outpatient Medications:  .  acetaminophen (TYLENOL) 500 MG tablet, Take 1,000 mg by mouth every 8 (eight) hours as needed for mild pain, moderate pain or headache., Disp: , Rfl:  .  albuterol (PROVENTIL HFA;VENTOLIN HFA) 108 (90 Base) MCG/ACT inhaler, Inhale 1-2 puffs into the lungs every 6 (six) hours as needed for wheezing., Disp: 1 Inhaler, Rfl: 0 .  FLUoxetine (PROZAC) 20 MG capsule, Take 1 capsule by mouth once daily, Disp: 90 capsule, Rfl: 3 .  levothyroxine (SYNTHROID, LEVOTHROID) 100 MCG tablet, 1 tab 6 days a week, 1.5 tabs x1 day a week., Disp: 96 tablet, Rfl: 3 .  pantoprazole (PROTONIX) 40 MG tablet, Take 1 tablet (40 mg total) by mouth 2 (two) times daily., Disp: 180 tablet, Rfl: 3 .  predniSONE (DELTASONE) 10 MG tablet, Take 4 tablets (40 mg total) by mouth daily., Disp: 20 tablet, Rfl: 0 .  traZODone (DESYREL) 50 MG tablet, Take 1-2 tablets (50-100 mg total) by mouth at bedtime as needed for sleep., Disp: 180 tablet, Rfl: 3 .  triamcinolone (KENALOG) 0.025 % ointment, Apply 1 application topically 2 (two) times daily., Disp: 454 g, Rfl: 0 .  Zinc Sulfate 220 (50 Zn) MG TABS, Take 1 tablet (220 mg total) by mouth daily., Disp: 7 tablet, Rfl: 0 .  sucralfate (CARAFATE) 1 GM/10ML suspension, Take 10 mLs (1 g total) by mouth 4 (four) times daily as needed. (Patient not taking: Reported on 10/13/2018), Disp: 420 mL, Rfl: 1  Allergies  Allergen Reactions  . Codeine Itching, Nausea And Vomiting and Other (See Comments)    Makes her feel wierd    OBJECTIVE: Temp 98.1 F (36.7 C) (Oral)   Ht 5\' 6"  (1.676 m)   BMI 30.59 kg/m  Gen: No acute distress.  Chest: Cough present Neuro: Alert. Oriented x3    ASSESSMENT AND PLAN: Jamie Mercado is a 58 y.o. female present  for  Moderate persistent asthmatic bronchitis with acute exacerbation Rest, hydrate.  mucinex (DM if cough) Albuterol inhaler every 2-6 hr PRN for wheezing.  Finish prednisone.  Doxy and tessalon perles prescribed, take until completed.  If cough present it can last up to 6-8 weeks.  F/U 2 weeks of not improved.   -She unfortunately is currently without insurance.  I discussed with her I am not positive the cost of the antibiotics out-of-pocket.  Encouraged her to look at the good Rx app or card.  If doxycycline is too expensive can consider azithromycin depending upon cost.  She was encouraged to call back and if doxycycline was too expensive.  > 15 minutes spent with patient, > 50% of that time face to face   Howard Pouch, DO 10/13/2018

## 2018-10-15 ENCOUNTER — Telehealth: Payer: Self-pay | Admitting: Family Medicine

## 2018-10-15 MED ORDER — FLUCONAZOLE 150 MG PO TABS: 150.0000 mg | ORAL_TABLET | Freq: Once | ORAL | 0 refills | Status: AC

## 2018-10-15 NOTE — Telephone Encounter (Signed)
Pt was seen 10/13/2018 and was given abx. She is asking for something to take for a yeast infection.  [facility]. Please advise.

## 2018-10-15 NOTE — Telephone Encounter (Signed)
Called patient and detailed msg. Okay per [facility]

## 2018-10-15 NOTE — Telephone Encounter (Signed)
Prescribed diflican

## 2018-10-15 NOTE — Telephone Encounter (Signed)
Patient is requesting Rx for Diflucan for a yeast infection. Patient states she always gets them when she takes an antibiotic. She uses [facility] on [address] in [place].

## 2018-10-15 NOTE — Addendum Note (Signed)
Addended by: Howard Pouch A on: 10/15/2018 02:23 PM   Modules accepted: Orders

## 2018-11-16 ENCOUNTER — Other Ambulatory Visit: Payer: Self-pay | Admitting: Family Medicine

## 2018-11-16 NOTE — Telephone Encounter (Signed)
RX has been sent.

## 2018-11-16 NOTE — Telephone Encounter (Signed)
Patient request refill  levothyroxine (SYNTHROID, LEVOTHROID) 100 MCG tablet [id number]  [place]

## 2018-12-02 ENCOUNTER — Other Ambulatory Visit: Payer: Self-pay | Admitting: Gastroenterology

## 2018-12-04 ENCOUNTER — Telehealth: Payer: Self-pay | Admitting: Family Medicine

## 2018-12-04 NOTE — Telephone Encounter (Signed)
Refill pantoprazole (PROTONIX) 40 MG tablet [id number]   [place]   Please call patient with any questions. [phone]

## 2018-12-05 ENCOUNTER — Other Ambulatory Visit: Payer: Self-pay | Admitting: Gastroenterology

## 2018-12-07 MED ORDER — PANTOPRAZOLE SODIUM 40 MG PO TBEC: 40.0000 mg | DELAYED_RELEASE_TABLET | Freq: Every day | ORAL | 5 refills | Status: DC

## 2018-12-07 NOTE — Telephone Encounter (Signed)
Left message for patient to call back to the office;  

## 2018-12-07 NOTE — Telephone Encounter (Signed)
Please assist with refill. Protonix 40 mg PO daily, #90, RF5. Can also schedule routine f/u appt with me. Was last seen in 11/2017. Thanks.

## 2018-12-08 NOTE — Telephone Encounter (Signed)
Left message for patient to call back to the office;  

## 2018-12-10 NOTE — Telephone Encounter (Signed)
Left message for patient to call back to the office-patient needs to be scheduled for a f/u OV;

## 2018-12-11 NOTE — Telephone Encounter (Signed)
Left message for patient to call back to the office;  

## 2018-12-15 NOTE — Telephone Encounter (Signed)
Left message for patient to call back to the office;  

## 2018-12-15 NOTE — Telephone Encounter (Signed)
letter for patient to call back to the office to schedule an appt mailed to patient

## 2019-02-25 ENCOUNTER — Telehealth: Payer: Self-pay | Admitting: Family Medicine

## 2019-02-25 MED ORDER — BENZONATATE 200 MG PO CAPS: 200.0000 mg | ORAL_CAPSULE | Freq: Three times a day (TID) | ORAL | 0 refills | Status: DC | PRN

## 2019-02-25 NOTE — Telephone Encounter (Signed)
Patient advised of RX sent in, she was also told to keep VV w/ provider.  She voiced understanding and says thanks!

## 2019-02-25 NOTE — Addendum Note (Signed)
Addended by: Tammi Sou on: 02/25/2019 11:53 AM   Modules accepted: Orders

## 2019-02-25 NOTE — Telephone Encounter (Signed)
Tessalon pearls eRx'd

## 2019-02-25 NOTE — Telephone Encounter (Signed)
Patient was tested positive COVID-19 2 weeks ago. She still has a lingering cough.  She has been prescribed a med to help with cough in the past, and felt it has helped her with coughing.  benzonatate (TESSALON) 200 MG capsule [id number]  [place]  Please change preferred pharmacy, she has moved to [place], [place], remove the current location    Thank you!

## 2019-02-25 NOTE — Telephone Encounter (Signed)
Per Dr Anitra Lauth pt can have VV at 4:15 and cough medicine will be sent to pharmacy. Pt was called and agreed with plan. She wants to make sure nothing with Codeine will be sent as she states she is allergic. Estill Bamberg scheduled patient. Pt advised to go to ED if SOB.

## 2019-02-25 NOTE — Telephone Encounter (Signed)
Pt was called and asked to set up appt, after telling patient we could do a telephone visit at 3pm the ladies up front had put someone in the 3 pm slot. Please advise if medication can be sent or if I need to call her back and reschedule for tomorrow. Pt is under the impression she has VV at 3 pm today.  Tested + [facility] @ [facility], 02/09/2020

## 2019-02-26 ENCOUNTER — Ambulatory Visit: Payer: No Typology Code available for payment source | Admitting: Family Medicine

## 2019-03-01 ENCOUNTER — Other Ambulatory Visit: Payer: Self-pay

## 2019-03-01 ENCOUNTER — Encounter: Payer: Self-pay | Admitting: Family Medicine

## 2019-03-01 ENCOUNTER — Ambulatory Visit (INDEPENDENT_AMBULATORY_CARE_PROVIDER_SITE_OTHER): Payer: Self-pay | Admitting: Family Medicine

## 2019-03-01 DIAGNOSIS — F17200 Nicotine dependence, unspecified, uncomplicated: Secondary | ICD-10-CM

## 2019-03-01 DIAGNOSIS — J4531 Mild persistent asthma with (acute) exacerbation: Secondary | ICD-10-CM

## 2019-03-01 DIAGNOSIS — U071 COVID-19: Secondary | ICD-10-CM

## 2019-03-01 MED ORDER — PREDNISONE 20 MG PO TABS: ORAL_TABLET | ORAL | 0 refills | Status: DC

## 2019-03-01 MED ORDER — IPRATROPIUM-ALBUTEROL 0.5-2.5 (3) MG/3ML IN SOLN: 3.0000 mL | Freq: Four times a day (QID) | RESPIRATORY_TRACT | 0 refills | Status: DC | PRN

## 2019-03-01 NOTE — Progress Notes (Signed)
Virtual Visit via Video Note  I connected with pt on 03/01/19 at  4:00 PM EST by telephone b/c video enabled telemedicine application was not working for patient and verified that I am speaking with the correct person using two identifiers.  Location patient: home Location provider:work or home office Persons participating in the virtual visit: patient, provider  I discussed the limitations of evaluation and management by telemedicine and the availability of in person appointments. The patient expressed understanding and agreed to proceed.  Telemedicine visit is a necessity given the COVID-19 restrictions in place at the current time.  HPI: 59 y/o WF with whom I am doing a telephone visit today (due to COVID-19 pandemic restrictions) for respiratory complaints. Three wks ago got fatigue, malaise, HA, body aches.  Tm 99.  +Sweats" one night. [facility], "freaked her out" so she went to get another covid test 3d later and pos again (at [facility]). Lost taste and smell sense. Cough came and persisted for a while. Out of work for 2 wks due to this.  She gradually got a lot better, then wheezing came on and some cough returned last week.  I called in tessalon pearls recently and these have been helpful. No fevers or SOB but chest feels tight/heavy like past RAD flares.  Has only one duoneb left at home.  When she takes duoneb it helps significantly for several hours. Has not been trying to smoke.  Taste and smell sense is starting to return.  ROS: -->last 1 week no fevers, no CP, no dizziness, no HAs, no rashes, no ST, no melena/hematochezia.  No polyuria or polydipsia.  No myalgias or arthralgias.   No n/v/d.   Past Medical History:  Diagnosis Date  . Acute gastritis without bleeding Fall 2019   H pylori NEG on EGD  . Cervical cancer ([place])    around age 3  . Colon polyps   . GAD (generalized anxiety disorder)    NO BENZOS DUE TO PT FAILING UDS X 2 IN 2019.  Marland Kitchen GERD  (gastroesophageal reflux disease)    well controlled with ranitidine bid  . Hay fever   . Hypothyroidism   . IBS (irritable bowel syndrome)   . Insomnia   . Kidney stones   . Marijuana use 2019   Failed UDS x 2 2019; NO FURTHER CONTROLLED SUBSTANCES ARE TO BE PRESCRIBED FOR THIS PATIENT.  . Moderate persistent asthma    Not taking controller meds b/c she said she improved significantly with cutting back on tobacco use.  . Psoriasis   . Tobacco dependence    ongoing as of 02/2017    Past Surgical History:  Procedure Laterality Date  . APPENDECTOMY    . COLONOSCOPY  2015  . endometrosis     TAH/BSO 2011  . ESOPHAGOGASTRODUODENOSCOPY  12/05/2017   lower third of esoph inflamed, +gastritis (H pylori NEG).  Duodenal bx Normal.  . KNEE SURGERY    . TONSILLECTOMY    . TOTAL ABDOMINAL HYSTERECTOMY W/ BILATERAL SALPINGOOPHORECTOMY  2011   Endometriosis  . TUBAL LIGATION      Family History  Problem Relation Age of Onset  . Diabetes Father   . Heart disease Father   . Hypertension Father   . Early death Father   . Hyperlipidemia Father   . Cancer Paternal Grandmother   . Colon polyps Paternal Grandmother   . Hyperlipidemia Mother   . Hypertension Mother   . Arthritis Maternal Grandmother   .  Diabetes Maternal Grandmother   . Hypertension Maternal Grandmother   . Hyperlipidemia Maternal Grandmother   . Kidney disease Maternal Grandmother   . Stroke Maternal Grandmother   . Hypertension Maternal Grandfather   . Hypertension Paternal Grandfather      Current Outpatient Medications:  .  acetaminophen (TYLENOL) 500 MG tablet, Take 1,000 mg by mouth every 8 (eight) hours as needed for mild pain, moderate pain or headache., Disp: , Rfl:  .  albuterol (PROVENTIL HFA;VENTOLIN HFA) 108 (90 Base) MCG/ACT inhaler, Inhale 1-2 puffs into the lungs every 6 (six) hours as needed for wheezing., Disp: 1 Inhaler, Rfl: 0 .  benzonatate (TESSALON) 200 MG capsule, Take 1 capsule (200 mg total)  by mouth 3 (three) times daily as needed for cough., Disp: 30 capsule, Rfl: 0 .  FLUoxetine (PROZAC) 20 MG capsule, Take 1 capsule by mouth once daily, Disp: 90 capsule, Rfl: 3 .  pantoprazole (PROTONIX) 40 MG tablet, Take 1 tablet (40 mg total) by mouth daily., Disp: 90 tablet, Rfl: 5 .  traZODone (DESYREL) 50 MG tablet, Take 1-2 tablets (50-100 mg total) by mouth at bedtime as needed for sleep., Disp: 180 tablet, Rfl: 3 .  triamcinolone (KENALOG) 0.025 % ointment, Apply 1 application topically 2 (two) times daily., Disp: 454 g, Rfl: 0 .  Zinc Sulfate 220 (50 Zn) MG TABS, Take 1 tablet (220 mg total) by mouth daily. (Patient not taking: Reported on 03/01/2019), Disp: 7 tablet, Rfl: 0  EXAM:  VITALS per patient if applicable: There were no vitals taken for this visit.   GENERAL: alert, oriented, sounds well and in no acute distress  No further exam b/c audio visit only.  LABS: none today    Chemistry      Component Value Date/Time   NA 143 10/09/2018 1758   K 4.4 10/09/2018 1758   CL 106 10/09/2018 1758   CO2 28 10/09/2018 1758   BUN 11 10/09/2018 1758   CREATININE 0.70 10/09/2018 1758   CREATININE 0.84 05/05/2018 1615      Component Value Date/Time   CALCIUM 9.9 10/09/2018 1758   ALKPHOS 59 10/03/2017 2012   AST 19 05/05/2018 1615   ALT 24 05/05/2018 1615   BILITOT 0.3 05/05/2018 1615     Lab Results  Component Value Date   HGBA1C 5.6 01/04/2016    ASSESSMENT AND PLAN:  Discussed the following assessment and plan:  1) Acute exacerbation of mild persistent asthma. Ongoing tobacco abuse--encouraged cessation. Recent covid 19 virus infection--she is getting over this appropriately and has quarantined appropriately and may return to work 03/05/19. Continue tessalon pearls 200 mg tid prn. Prednisone 40 mg qd x 5d, then 20mg  qd x 5d. Will send in more bronchodilator treatments (duoneb).   I discussed the assessment and treatment plan with the patient. The patient was  provided an opportunity to ask questions and all were answered. The patient agreed with the plan and demonstrated an understanding of the instructions.   The patient was advised to call back or seek an in-person evaluation if the symptoms worsen or if the condition fails to improve as anticipated.  Spent 15 min with pt today, with >50% of this time spent in counseling and care coordination regarding the above problems.  Signed:  Crissie Sickles, MD           03/01/2019

## 2019-05-19 ENCOUNTER — Encounter: Payer: Self-pay | Admitting: Family Medicine

## 2019-06-25 ENCOUNTER — Other Ambulatory Visit: Payer: Self-pay

## 2019-06-25 MED ORDER — LEVOTHYROXINE SODIUM 100 MCG PO TABS: ORAL_TABLET | ORAL | 3 refills | Status: DC

## 2019-06-25 NOTE — Addendum Note (Signed)
Addended by: Sanda Linger on: 06/25/2019 04:28 PM   Modules accepted: Orders

## 2019-06-25 NOTE — Telephone Encounter (Signed)
Refill meds Levothyroxin --- scheduled appt for 07/14/19   Needs meds filled at [facility] for this time only.  She is helping take care of a friend.

## 2019-06-25 NOTE — Addendum Note (Signed)
Addended by: Tammi Sou on: 06/25/2019 03:47 PM   Modules accepted: Orders

## 2019-06-25 NOTE — Telephone Encounter (Signed)
OK, levothyroxine eRx'd but [facility] to [facility]/c [facility] was the [facility] already selected. [facility] [place] to come up but nothing would come up. Pls cancel the one I sent to [facility] in [place] and send rx to [facility] in [place].-thx

## 2019-06-25 NOTE — Telephone Encounter (Signed)
Please advise 

## 2019-06-28 ENCOUNTER — Telehealth: Payer: Self-pay

## 2019-06-28 NOTE — Telephone Encounter (Signed)
Yes this is fine.

## 2019-06-28 NOTE — Telephone Encounter (Signed)
Contacted pt's pharmacy to give authorization per PCP, okay to change manufacturer's for levothyroxine.

## 2019-06-28 NOTE — Telephone Encounter (Signed)
Pharmacy would like to know if okay to change manufacturers of pt's levothyroxine. Most recent refill sent 5/7 #96 with 3 refills  Please advise, thanks.

## 2019-07-14 ENCOUNTER — Ambulatory Visit: Payer: No Typology Code available for payment source | Admitting: Family Medicine

## 2019-07-14 DIAGNOSIS — Z0289 Encounter for other administrative examinations: Secondary | ICD-10-CM

## 2019-07-14 NOTE — Progress Notes (Deleted)
OFFICE VISIT  07/14/2019   CC: No chief complaint on file.  HPI:    Patient is a 59 y.o. Caucasian female who presents for f/u hypothyroidism, insomnia, GERD, and GAD.  Past Medical History:  Diagnosis Date  . Acute gastritis without bleeding Fall 2019   H pylori NEG on EGD  . Cervical cancer ([place])    around age 62  . Colon polyps   . GAD (generalized anxiety disorder)    NO BENZOS DUE TO PT FAILING UDS X 2 IN 2019.  Marland Kitchen GERD (gastroesophageal reflux disease)    well controlled with ranitidine bid  . Hay fever   . Hypothyroidism   . IBS (irritable bowel syndrome)   . Insomnia   . Kidney stones   . Marijuana use 2019   Failed UDS x 2 2019; NO FURTHER CONTROLLED SUBSTANCES ARE TO BE PRESCRIBED FOR THIS PATIENT.  . Moderate persistent asthma    Not taking controller meds b/c she said she improved significantly with cutting back on tobacco use.  . Psoriasis   . Tobacco dependence    ongoing as of 02/2017    Past Surgical History:  Procedure Laterality Date  . APPENDECTOMY    . COLONOSCOPY  2015  . endometrosis     TAH/BSO 2011  . ESOPHAGOGASTRODUODENOSCOPY  12/05/2017   lower third of esoph inflamed, +gastritis (H pylori NEG).  Duodenal bx Normal.  . KNEE SURGERY    . TONSILLECTOMY    . TOTAL ABDOMINAL HYSTERECTOMY W/ BILATERAL SALPINGOOPHORECTOMY  2011   Endometriosis  . TUBAL LIGATION      Outpatient Medications Prior to Visit  Medication Sig Dispense Refill  . acetaminophen (TYLENOL) 500 MG tablet Take 1,000 mg by mouth every 8 (eight) hours as needed for mild pain, moderate pain or headache.    . albuterol (PROVENTIL HFA;VENTOLIN HFA) 108 (90 Base) MCG/ACT inhaler Inhale 1-2 puffs into the lungs every 6 (six) hours as needed for wheezing. 1 Inhaler 0  . benzonatate (TESSALON) 200 MG capsule Take 1 capsule (200 mg total) by mouth 3 (three) times daily as needed for cough. 30 capsule 0  . FLUoxetine (PROZAC) 20 MG capsule Take 1 capsule by mouth once daily 90 capsule  3  . ipratropium-albuterol (DUONEB) 0.5-2.5 (3) MG/3ML SOLN Take 3 mLs by nebulization every 6 (six) hours as needed. 75 mL 0  . levothyroxine (SYNTHROID) 100 MCG tablet 1 tab po qd x 6d per week and 1.5 tabs one day a week 96 tablet 3  . pantoprazole (PROTONIX) 40 MG tablet Take 1 tablet (40 mg total) by mouth daily. 90 tablet 5  . predniSONE (DELTASONE) 20 MG tablet 2 tabs po qd x 5d, then 1 tab po qd x 5d 15 tablet 0  . traZODone (DESYREL) 50 MG tablet Take 1-2 tablets (50-100 mg total) by mouth at bedtime as needed for sleep. 180 tablet 3  . triamcinolone (KENALOG) 0.025 % ointment Apply 1 application topically 2 (two) times daily. 454 g 0  . Zinc Sulfate 220 (50 Zn) MG TABS Take 1 tablet (220 mg total) by mouth daily. (Patient not taking: Reported on 03/01/2019) 7 tablet 0   No facility-administered medications prior to visit.    Allergies  Allergen Reactions  . Codeine Itching, Nausea And Vomiting and Other (See Comments)    Makes her feel wierd    ROS As per HPI  PE: There were no vitals taken for this visit. ***  LABS:  Lab Results  Component Value  Date   TSH 2.08 09/30/2018   Lab Results  Component Value Date   WBC 6.5 10/09/2018   HGB 13.8 10/09/2018   HCT 42.6 10/09/2018   MCV 87.7 10/09/2018   PLT 229 10/09/2018   Lab Results  Component Value Date   CREATININE 0.70 10/09/2018   BUN 11 10/09/2018   NA 143 10/09/2018   K 4.4 10/09/2018   CL 106 10/09/2018   CO2 28 10/09/2018   Lab Results  Component Value Date   ALT 24 05/05/2018   AST 19 05/05/2018   ALKPHOS 59 10/03/2017   BILITOT 0.3 05/05/2018   Lab Results  Component Value Date   CHOL 242 (H) 05/26/2015   Lab Results  Component Value Date   HDL 74 05/26/2015   Lab Results  Component Value Date   LDLCALC 140 (H) 05/26/2015   Lab Results  Component Value Date   TRIG 142 05/26/2015   Lab Results  Component Value Date   CHOLHDL 3.3 05/26/2015   Lab Results  Component Value Date    HGBA1C 5.6 01/04/2016   IMPRESSION AND PLAN:  No problem-specific Assessment & Plan notes found for this encounter.   An After Visit Summary was printed and given to the patient.  FOLLOW UP: No follow-ups on file.  Signed:  Crissie Sickles, MD           07/14/2019

## 2019-10-15 ENCOUNTER — Telehealth: Payer: Self-pay

## 2019-10-15 MED ORDER — FLUCONAZOLE 150 MG PO TABS: 150.0000 mg | ORAL_TABLET | Freq: Once | ORAL | 0 refills | Status: AC

## 2019-10-15 NOTE — Telephone Encounter (Signed)
RX sent to [facility]

## 2019-10-15 NOTE — Telephone Encounter (Signed)
[facility] find [facility] on [address] in EMR. Pls eRx diflucan 150mg , 1 tab po x 1 dose, #1, no RF--after getting clarification on the pharmacy.--thx

## 2019-10-15 NOTE — Telephone Encounter (Signed)
Verified with patient which location [facility] on [address]

## 2019-10-15 NOTE — Telephone Encounter (Signed)
Patient has vaginal yeast infection (itching, irritated) from antibotic Rx'd at [facility] ED for tick bite. Patient is requesting Rx for vaginal yeast infection to be sent to  [facility]. [employer]

## 2019-10-15 NOTE — Telephone Encounter (Signed)
Pt was seen at [facility] on 8/18 and given doxycycline 100mg , 1 po bid x10d #20 with 0 refills. Her last day will be tomorrow on medication. Requesting rx for yeast infection.  Please advise, thanks.

## 2019-11-15 ENCOUNTER — Emergency Department (HOSPITAL_COMMUNITY): Payer: Self-pay

## 2019-11-15 ENCOUNTER — Emergency Department (HOSPITAL_COMMUNITY)
Admission: EM | Admit: 2019-11-15 | Discharge: 2019-11-15 | Disposition: A | Discharge status: Home / Self Care | Payer: Self-pay | Attending: Emergency Medicine | Admitting: Emergency Medicine

## 2019-11-15 ENCOUNTER — Telehealth: Payer: Self-pay | Admitting: *Deleted

## 2019-11-15 DIAGNOSIS — N201 Calculus of ureter: Secondary | ICD-10-CM | POA: Insufficient documentation

## 2019-11-15 DIAGNOSIS — Z79890 Hormone replacement therapy (postmenopausal): Secondary | ICD-10-CM | POA: Insufficient documentation

## 2019-11-15 DIAGNOSIS — E039 Hypothyroidism, unspecified: Secondary | ICD-10-CM | POA: Insufficient documentation

## 2019-11-15 DIAGNOSIS — F17210 Nicotine dependence, cigarettes, uncomplicated: Secondary | ICD-10-CM | POA: Insufficient documentation

## 2019-11-15 DIAGNOSIS — K219 Gastro-esophageal reflux disease without esophagitis: Secondary | ICD-10-CM | POA: Insufficient documentation

## 2019-11-15 DIAGNOSIS — Z79899 Other long term (current) drug therapy: Secondary | ICD-10-CM | POA: Insufficient documentation

## 2019-11-15 DIAGNOSIS — Z86010 Personal history of colonic polyps: Secondary | ICD-10-CM | POA: Insufficient documentation

## 2019-11-15 DIAGNOSIS — J4540 Moderate persistent asthma, uncomplicated: Secondary | ICD-10-CM | POA: Insufficient documentation

## 2019-11-15 LAB — CBC
HCT: 45 % (ref 36.0–46.0)
Hemoglobin: 14.8 g/dL (ref 12.0–15.0)
MCH: 28.6 pg (ref 26.0–34.0)
MCHC: 32.9 g/dL (ref 30.0–36.0)
MCV: 86.9 fL (ref 80.0–100.0)
Platelets: 293 10*3/uL (ref 150–400)
RBC: 5.18 MIL/uL — ABNORMAL HIGH (ref 3.87–5.11)
RDW: 13.3 % (ref 11.5–15.5)
WBC: 7.8 10*3/uL (ref 4.0–10.5)
nRBC: 0 % (ref 0.0–0.2)

## 2019-11-15 LAB — BASIC METABOLIC PANEL
Anion gap: 7 (ref 5–15)
BUN: 13 mg/dL (ref 6–20)
CO2: 25 mmol/L (ref 22–32)
Calcium: 9.5 mg/dL (ref 8.9–10.3)
Chloride: 106 mmol/L (ref 98–111)
Creatinine, Ser: 0.77 mg/dL (ref 0.44–1.00)
GFR calc Af Amer: >60 mL/min (ref >60)
GFR calc non Af Amer: >60 mL/min (ref >60)
Glucose, Bld: 129 mg/dL — ABNORMAL HIGH (ref 70–99)
Potassium: 4.4 mmol/L (ref 3.5–5.1)
Sodium: 138 mmol/L (ref 135–145)

## 2019-11-15 LAB — URINALYSIS, ROUTINE W REFLEX MICROSCOPIC
Bilirubin Urine: NEGATIVE
Glucose, UA: NEGATIVE mg/dL
Hgb urine dipstick: NEGATIVE
Ketones, ur: NEGATIVE mg/dL
Leukocytes,Ua: NEGATIVE
Nitrite: NEGATIVE
Protein, ur: NEGATIVE mg/dL
Specific Gravity, Urine: 1.016 (ref 1.005–1.030)
pH: 5 (ref 5.0–8.0)

## 2019-11-15 IMAGING — CT CT RENAL STONE PROTOCOL
2 of 4 series · 17 of 46 positions shown, 19 images · non-contrast
Comparison: [DATE]

CLINICAL DATA: Left-sided flank pain for 2 days

EXAM:
CT ABDOMEN AND PELVIS WITHOUT CONTRAST
TECHNIQUE: Multidetector CT imaging of the abdomen and pelvis was performed
following the standard protocol without IV contrast.

[Series 3: stone study 5.0 i30f 2 · axial · 0.98mm/px · z∈[-653,-238]mm · 14 of 91 slices shown, 16 images]
[im 4/91  soft-tissue]
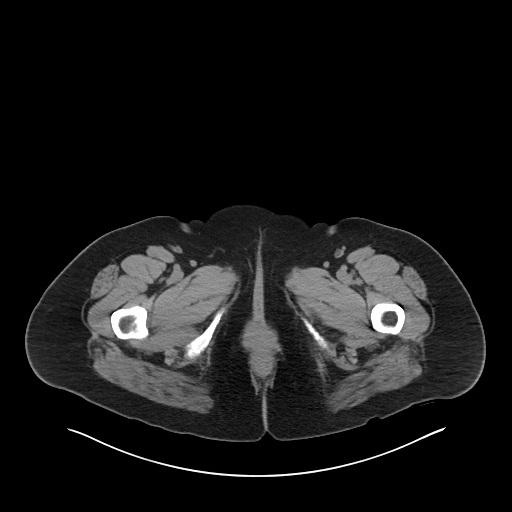
[im 4/91  bone]
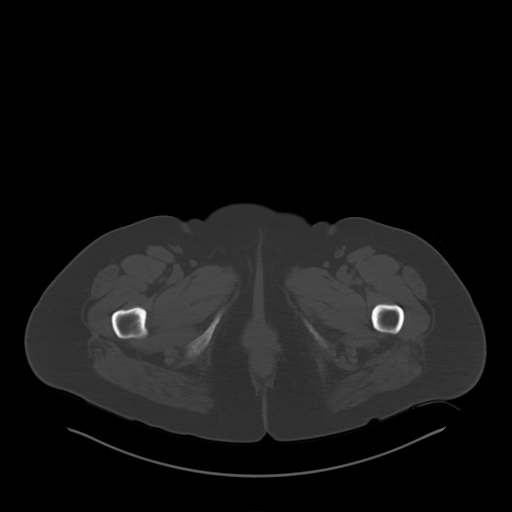
[im 12/91  soft-tissue]
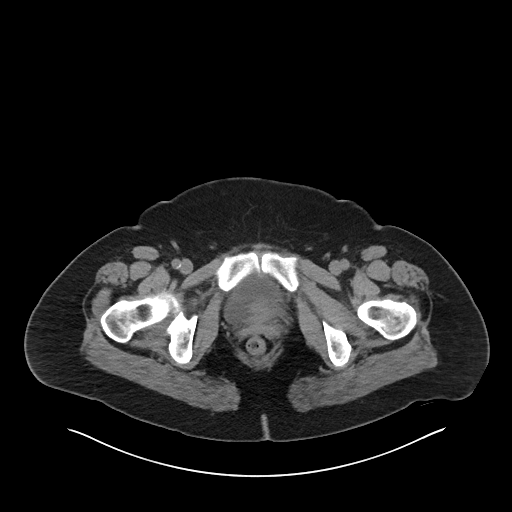
[im 19/91  soft-tissue]
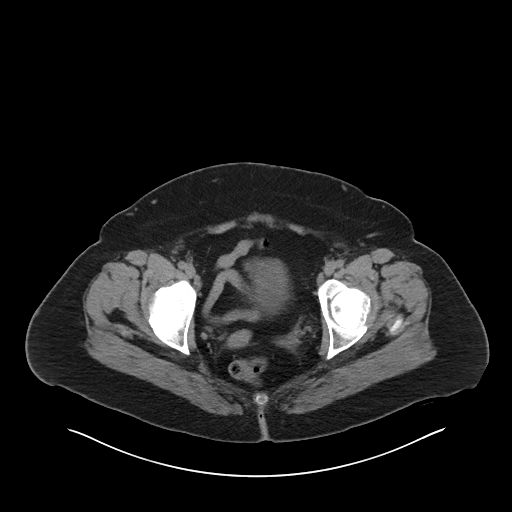
[im 23/91  soft-tissue]
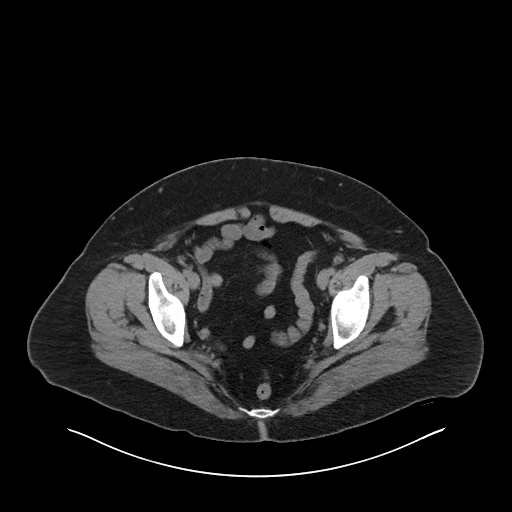
[im 31/91  soft-tissue]
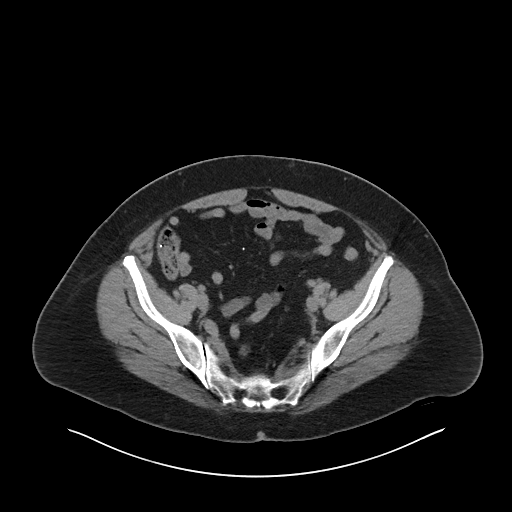
[im 38/91  soft-tissue]
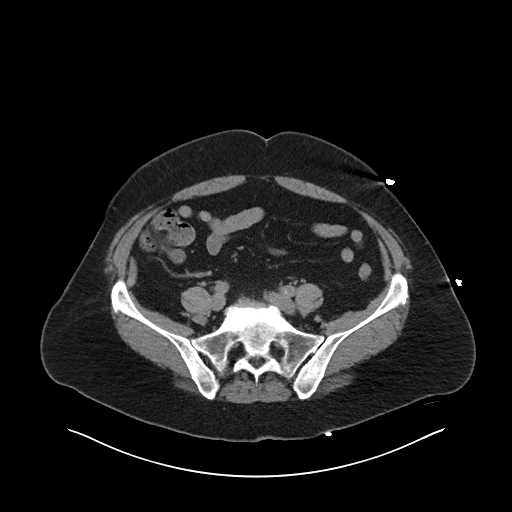
[im 42/91  soft-tissue]
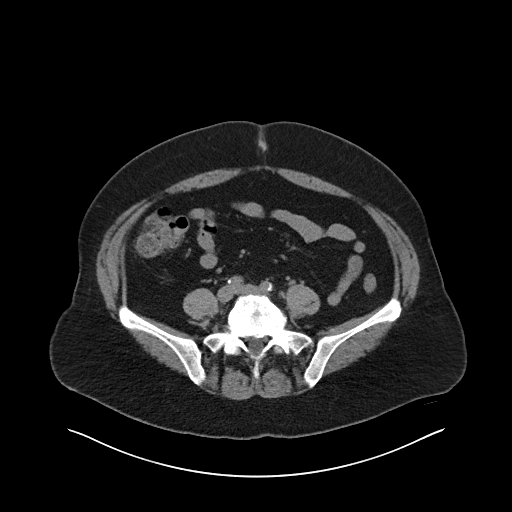
[im 49/91  soft-tissue]
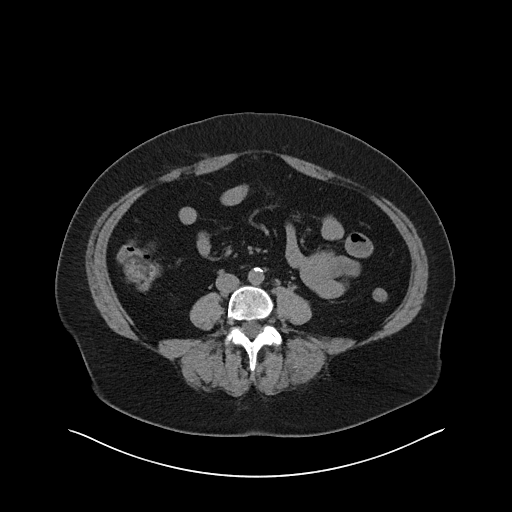
[im 53/91  soft-tissue]
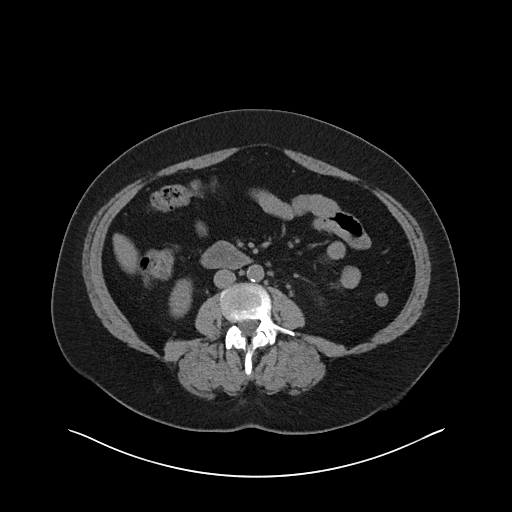
[im 53/91  bone]
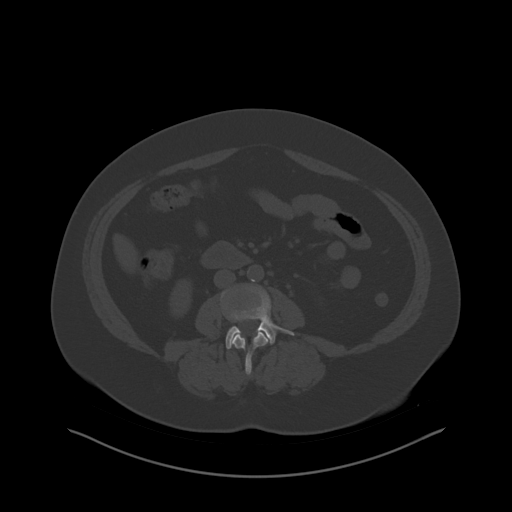
[im 61/91  soft-tissue]
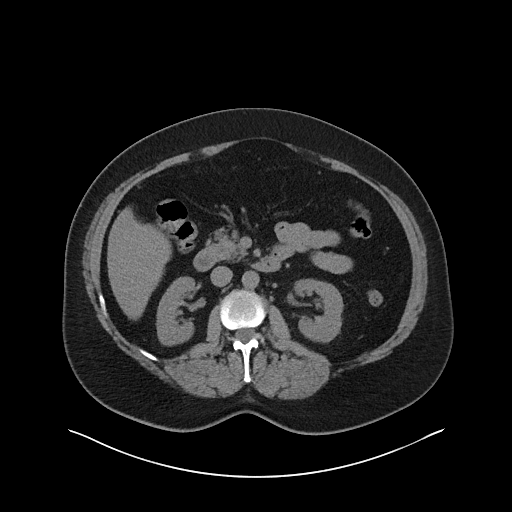
[im 68/91  soft-tissue]
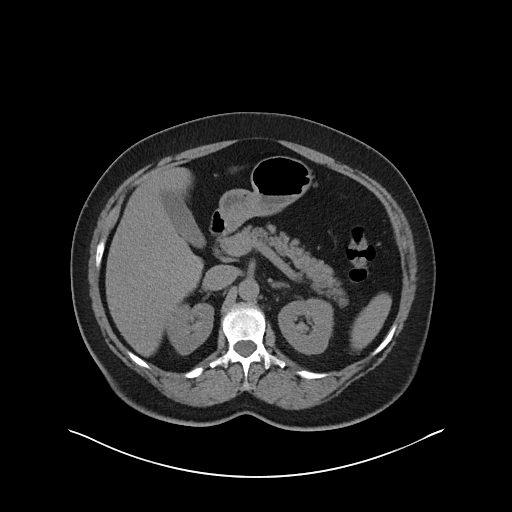
[im 72/91  soft-tissue]
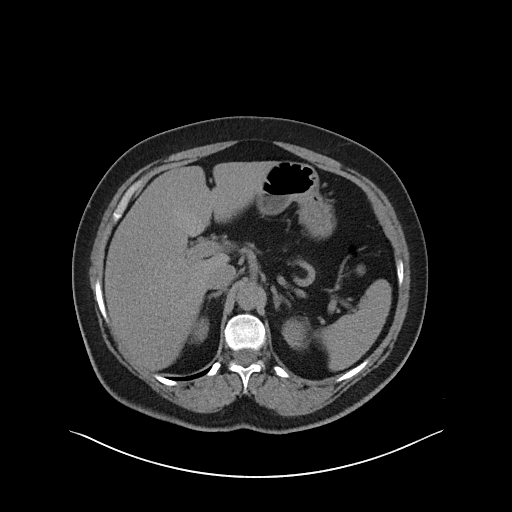
[im 79/91  soft-tissue]
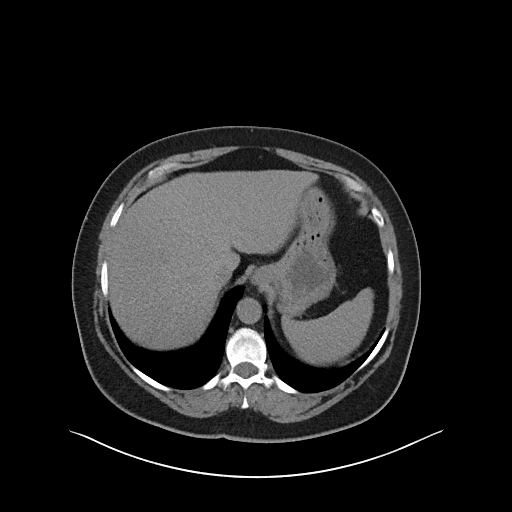
[im 87/91  soft-tissue]
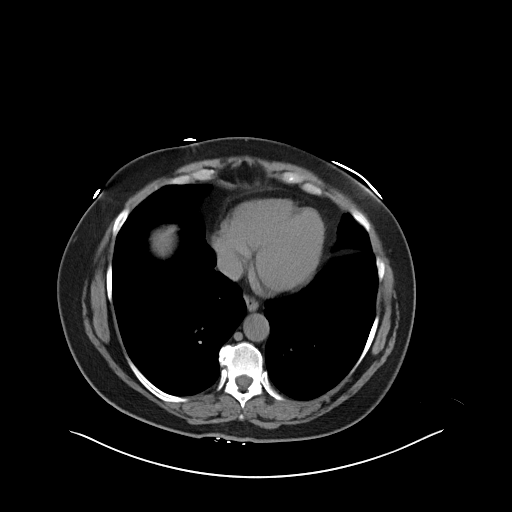

[Series 6: coronal soft tissue · coronal · 0.92mm/px · 3 of 94 slices shown]
[im 32/94  soft-tissue]
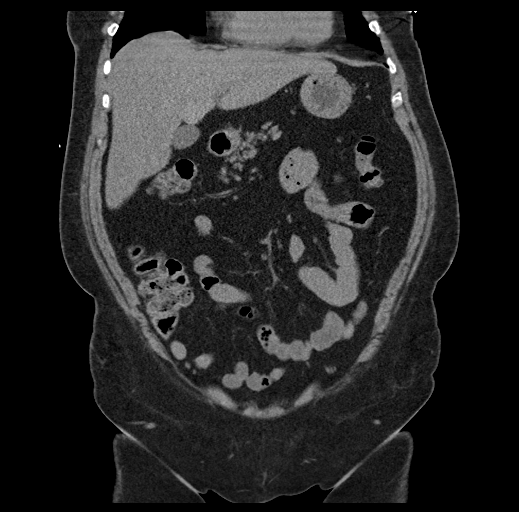
[im 42/94  soft-tissue]
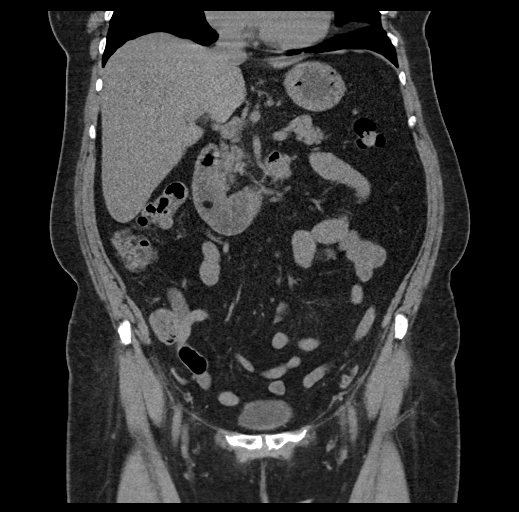
[im 52/94  soft-tissue]
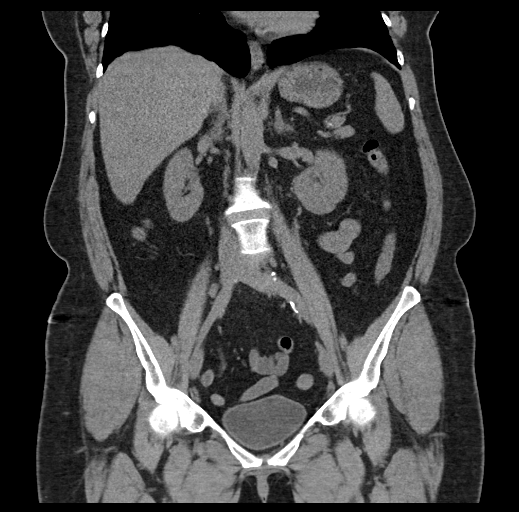

[17 of 46 positions shown; findings below may reference images not displayed]

FINDINGS: Lower chest: No acute abnormality.

Hepatobiliary: No focal liver abnormality is seen. No gallstones,
gallbladder wall thickening, or biliary dilatation.

Pancreas: Unremarkable. No pancreatic ductal dilatation or
surrounding inflammatory changes.

Spleen: Normal in size without focal abnormality.

Adrenals/Urinary Tract: Adrenal glands are within normal limits.
Kidneys demonstrate no renal calculi or obstructive changes. There
is however a small 3 mm left UVJ stone without significant
obstructive change. This is likely the etiology of the patient's
current symptomatology. The bladder is partially distended.

Stomach/Bowel: The appendix has been surgically removed. No
obstructive or inflammatory changes of large or small bowel are
seen. The stomach is decompressed.

Vascular/Lymphatic: Aortic atherosclerosis. No enlarged abdominal or
pelvic lymph nodes.

Reproductive: Status post hysterectomy. No adnexal masses.

Other: No abdominal wall hernia or abnormality. No abdominopelvic
ascites.

Musculoskeletal: Mild degenerative changes of lumbar spine are
noted.
IMPRESSION: 3 mm distal left ureteral stone at the UVJ without significant
obstructive change. This is likely the etiology of the patient's
underlying symptomatology.

## 2019-11-15 MED ORDER — TAMSULOSIN HCL 0.4 MG PO CAPS: 0.4000 mg | ORAL_CAPSULE | Freq: Every day | ORAL | 0 refills | Status: DC

## 2019-11-15 MED ORDER — TAMSULOSIN HCL 0.4 MG PO CAPS: 0.4000 mg | ORAL_CAPSULE | Freq: Every day | ORAL | 0 refills | Status: AC

## 2019-11-15 MED ORDER — KETOROLAC TROMETHAMINE 60 MG/2ML IM SOLN
60.0000 mg | Freq: Once | INTRAMUSCULAR | Status: AC
  Administered 2019-11-15: 60 mg via INTRAMUSCULAR

## 2019-11-15 MED ORDER — OXYCODONE-ACETAMINOPHEN 5-325 MG PO TABS: 2.0000 | ORAL_TABLET | ORAL | 0 refills | Status: DC | PRN

## 2019-11-15 MED ORDER — OXYCODONE-ACETAMINOPHEN 5-325 MG PO TABS
1.0000 | ORAL_TABLET | Freq: Once | ORAL | Status: AC
  Administered 2019-11-15: 1 via ORAL
  Filled 2019-11-15: qty 1

## 2019-11-15 MED ORDER — ONDANSETRON 8 MG PO TBDP: 8.0000 mg | ORAL_TABLET | Freq: Three times a day (TID) | ORAL | 0 refills | Status: DC | PRN

## 2019-11-15 MED ORDER — ONDANSETRON HCL 4 MG PO TABS: 4.0000 mg | ORAL_TABLET | Freq: Once | ORAL | Status: DC

## 2019-11-15 MED ORDER — KETOROLAC TROMETHAMINE 60 MG/2ML IM SOLN
30.0000 mg | Freq: Once | INTRAMUSCULAR | Status: DC
  Filled 2019-11-15: qty 2

## 2019-11-15 MED ORDER — ONDANSETRON 4 MG PO TBDP
4.0000 mg | ORAL_TABLET | Freq: Once | ORAL | Status: AC
  Administered 2019-11-15: 4 mg via ORAL
  Filled 2019-11-15: qty 1

## 2019-11-15 NOTE — Telephone Encounter (Signed)
Pt called to have location of Rx sent to [facility] in [place], [place] as she will be in [place] until the end of the week caring for her mom.  [facility] relayed request to Dr. Jeanell Sparrow.

## 2019-11-15 NOTE — ED Triage Notes (Signed)
Pt here from home with c/o left side flank pain , pt has hx of kidney stones , pain started last night and has been off and on all night

## 2019-11-15 NOTE — ED Provider Notes (Signed)
[facility] EMERGENCY DEPARTMENT Provider Note   CSN: [id number] Arrival date & time: 11/15/19  0744     History No chief complaint on file.   Jamie Mercado is a 59 y.o. female.  HPI     59 year old female presents today complaining of left back, flank, and groin pain that began yesterday.  Is been constant through the night.  She has had some associated nausea no vomiting.  She denies any hematuria but has had frequency of urination.  Similar to previous kidney stones.  She has not had a kidney stone approximately 15 years since remember who she saw out was treated in the ED.  She states that she did have some kidney stones broken up in the past with what sounds like lithotripsy.  She does not currently have a urologist. Past Medical History:  Diagnosis Date  . Acute gastritis without bleeding Fall 2019   H pylori NEG on EGD  . Cervical cancer ([place])    around age 62  . Colon polyps   . GAD (generalized anxiety disorder)    NO BENZOS DUE TO PT FAILING UDS X 2 IN 2019.  Marland Kitchen GERD (gastroesophageal reflux disease)    well controlled with ranitidine bid  . Hay fever   . Hypothyroidism   . IBS (irritable bowel syndrome)   . Insomnia   . Kidney stones   . Marijuana use 2019   Failed UDS x 2 2019; NO FURTHER CONTROLLED SUBSTANCES ARE TO BE PRESCRIBED FOR THIS PATIENT.  . Moderate persistent asthma    Not taking controller meds b/c she said she improved significantly with cutting back on tobacco use.  . Psoriasis   . Tobacco dependence    ongoing as of 02/2017    Patient Active Problem List   Diagnosis Date Noted  . Hypothyroidism   . Psoriasis (a type of skin inflammation) 07/27/2016  . Elevated BP without diagnosis of hypertension 01/23/2016  . Generalized anxiety disorder 05/26/2015  . Insomnia 05/26/2015    Past Surgical History:  Procedure Laterality Date  . APPENDECTOMY    . COLONOSCOPY  2015  . endometrosis     TAH/BSO 2011  .  ESOPHAGOGASTRODUODENOSCOPY  12/05/2017   lower third of esoph inflamed, +gastritis (H pylori NEG).  Duodenal bx Normal.  . KNEE SURGERY    . TONSILLECTOMY    . TOTAL ABDOMINAL HYSTERECTOMY W/ BILATERAL SALPINGOOPHORECTOMY  2011   Endometriosis  . TUBAL LIGATION       OB History    Gravida  2   Para  2   Term  2   Preterm  0   AB  0   Living        SAB  0   TAB  0   Ectopic  0   Multiple      Live Births              Family History  Problem Relation Age of Onset  . Diabetes Father   . Heart disease Father   . Hypertension Father   . Early death Father   . Hyperlipidemia Father   . Cancer Paternal Grandmother   . Colon polyps Paternal Grandmother   . Hyperlipidemia Mother   . Hypertension Mother   . Arthritis Maternal Grandmother   . Diabetes Maternal Grandmother   . Hypertension Maternal Grandmother   . Hyperlipidemia Maternal Grandmother   . Kidney disease Maternal Grandmother   . Stroke Maternal Grandmother   . Hypertension  Maternal Grandfather   . Hypertension Paternal Grandfather     Social History   Tobacco Use  . Smoking status: Current Every Day Smoker    Packs/day: 0.50    Types: Cigarettes  . Smokeless tobacco: Never Used  . Tobacco comment: smoke less than a 0.5 pack  Vaping Use  . Vaping Use: Never used  Substance Use Topics  . Alcohol use: Yes    Comment: socially  . Drug use: No    Home Medications Prior to Admission medications   Medication Sig Start Date End Date Taking? Authorizing Provider  acetaminophen (TYLENOL) 500 MG tablet Take 1,000 mg by mouth every 8 (eight) hours as needed for mild pain, moderate pain or headache.    [provider]  albuterol (PROVENTIL HFA;VENTOLIN HFA) 108 (90 Base) MCG/ACT inhaler Inhale 1-2 puffs into the lungs every 6 (six) hours as needed for wheezing. 05/26/15   Micheline Chapman, NP  benzonatate (TESSALON) 200 MG capsule Take 1 capsule (200 mg total) by mouth 3 (three) times  daily as needed for cough. 02/25/19   McGowen, Adrian Blackwater, MD  FLUoxetine (PROZAC) 20 MG capsule Take 1 capsule by mouth once daily 09/08/18   McGowen, Adrian Blackwater, MD  ipratropium-albuterol (DUONEB) 0.5-2.5 (3) MG/3ML SOLN Take 3 mLs by nebulization every 6 (six) hours as needed. 03/01/19   McGowen, Adrian Blackwater, MD  levothyroxine (SYNTHROID) 100 MCG tablet 1 tab po qd x 6d per week and 1.5 tabs one day a week 06/25/19   McGowen, Adrian Blackwater, MD  ondansetron (ZOFRAN ODT) 8 MG disintegrating tablet Take 1 tablet (8 mg total) by mouth every 8 (eight) hours as needed for nausea or vomiting. 11/15/19   Pattricia Boss, MD  oxyCODONE-acetaminophen (PERCOCET/ROXICET) 5-325 MG tablet Take 2 tablets by mouth every 4 (four) hours as needed for severe pain. 11/15/19   Pattricia Boss, MD  pantoprazole (PROTONIX) 40 MG tablet Take 1 tablet (40 mg total) by mouth daily. 12/07/18   Cirigliano, Vito V, DO  predniSONE (DELTASONE) 20 MG tablet 2 tabs po qd x 5d, then 1 tab po qd x 5d 03/01/19   McGowen, Adrian Blackwater, MD  tamsulosin (FLOMAX) 0.4 MG CAPS capsule Take 1 capsule (0.4 mg total) by mouth daily for 14 days. 11/15/19 11/29/19  Pattricia Boss, MD  traZODone (DESYREL) 50 MG tablet Take 1-2 tablets (50-100 mg total) by mouth at bedtime as needed for sleep. 08/05/18   McGowen, Adrian Blackwater, MD  triamcinolone (KENALOG) 0.025 % ointment Apply 1 application topically 2 (two) times daily. 07/23/16   Scot Jun, FNP  Zinc Sulfate 220 (50 Zn) MG TABS Take 1 tablet (220 mg total) by mouth daily. Patient not taking: Reported on 03/01/2019 10/09/18   Fredia Sorrow, MD    Allergies    Codeine  Review of Systems   Review of Systems  All other systems reviewed and are negative.   Physical Exam Updated Vital Signs BP (!) 149/81 (BP Location: Left Arm)   Pulse 80   Temp 97.6 F (36.4 C) (Oral)   Resp 16   Ht 1.664 m (5' 5.5")   Wt 76.2 kg   SpO2 98%   BMI 27.53 kg/m   Physical Exam Vitals and nursing note reviewed.    Constitutional:      Appearance: She is well-developed.  HENT:     Head: Normocephalic and atraumatic.     Right Ear: External ear normal.     Left Ear: External ear normal.  Nose: Nose normal.  Eyes:     Conjunctiva/sclera: Conjunctivae normal.     Pupils: Pupils are equal, round, and reactive to light.  Cardiovascular:     Rate and Rhythm: Normal rate and regular rhythm.     Heart sounds: Normal heart sounds.  Pulmonary:     Effort: Pulmonary effort is normal.     Breath sounds: Normal breath sounds.  Abdominal:     General: Bowel sounds are normal.     Palpations: Abdomen is soft.     Comments: Some mild diffuse left sided abdominal tenderness palpation  Genitourinary:    Comments: Some CVA tenderness mild Musculoskeletal:        General: Normal range of motion.     Cervical back: Normal range of motion and neck supple.  Skin:    General: Skin is warm and dry.  Neurological:     Mental Status: She is alert and oriented to person, place, and time.     Deep Tendon Reflexes: Reflexes are normal and symmetric.  Psychiatric:        Behavior: Behavior normal.        Thought Content: Thought content normal.        Judgment: Judgment normal.     ED Results / Procedures / Treatments   Labs (all labs ordered are listed, but only abnormal results are displayed) Labs Reviewed  CBC - Abnormal; Notable for the following components:      Result Value   RBC 5.18 (*)    All other components within normal limits  BASIC METABOLIC PANEL - Abnormal; Notable for the following components:   Glucose, Bld 129 (*)    All other components within normal limits  URINALYSIS, ROUTINE W REFLEX MICROSCOPIC - Abnormal; Notable for the following components:   APPearance HAZY (*)    All other components within normal limits    EKG None  Radiology CT Renal Stone Study  Result Date: 11/15/2019 CLINICAL DATA:  Left-sided flank pain for 2 days EXAM: CT ABDOMEN AND PELVIS WITHOUT CONTRAST  TECHNIQUE: Multidetector CT imaging of the abdomen and pelvis was performed following the standard protocol without IV contrast. COMPARISON:  10/03/2017 FINDINGS: Lower chest: No acute abnormality. Hepatobiliary: No focal liver abnormality is seen. No gallstones, gallbladder wall thickening, or biliary dilatation. Pancreas: Unremarkable. No pancreatic ductal dilatation or surrounding inflammatory changes. Spleen: Normal in size without focal abnormality. Adrenals/Urinary Tract: Adrenal glands are within normal limits. Kidneys demonstrate no renal calculi or obstructive changes. There is however a small 3 mm left UVJ stone without significant obstructive change. This is likely the etiology of the patient's current symptomatology. The bladder is partially distended. Stomach/Bowel: The appendix has been surgically removed. No obstructive or inflammatory changes of large or small bowel are seen. The stomach is decompressed. Vascular/Lymphatic: Aortic atherosclerosis. No enlarged abdominal or pelvic lymph nodes. Reproductive: Status post hysterectomy. No adnexal masses. Other: No abdominal wall hernia or abnormality. No abdominopelvic ascites. Musculoskeletal: Mild degenerative changes of lumbar spine are noted. IMPRESSION: 3 mm distal left ureteral stone at the UVJ without significant obstructive change. This is likely the etiology of the patient's underlying symptomatology. Electronically Signed   By: Inez Catalina M.D.   On: 11/15/2019 09:29    Procedures Procedures (including critical care time)  Medications Ordered in ED Medications  ketorolac (TORADOL) injection 60 mg (has no administration in time range)  oxyCODONE-acetaminophen (PERCOCET/ROXICET) 5-325 MG per tablet 1 tablet (1 tablet Oral Given 11/15/19 0806)  ondansetron (ZOFRAN-ODT) disintegrating tablet  4 mg (4 mg Oral Given 11/15/19 0805)    ED Course  I have reviewed the triage vital signs and the nursing notes.  Pertinent labs & imaging  results that were available during my care of the patient were reviewed by me and considered in my medical decision making (see chart for details).    MDM Rules/Calculators/A&P                         Patient with pain consistent with ureteral colic.  CT obtained prior to my evaluation is significant for 3 mm distal left ureteral stone.  Patient's urinalysis is clear.  She is receiving Toradol here in the ED.  Prescription given for Flomax, Percocet, and Zofran.  She is referred for follow-up with urologist.  Discussed return precautions and need for follow-up patient voices understanding. Final Clinical Impression(s) / ED Diagnoses Final diagnoses:  Left ureteral stone    Rx / DC Orders ED Discharge Orders         Ordered    tamsulosin (FLOMAX) 0.4 MG CAPS capsule  Daily        11/15/19 1025    ondansetron (ZOFRAN ODT) 8 MG disintegrating tablet  Every 8 hours PRN        11/15/19 1025    oxyCODONE-acetaminophen (PERCOCET/ROXICET) 5-325 MG tablet  Every 4 hours PRN        11/15/19 1025           Pattricia Boss, MD 11/15/19 1030

## 2020-02-17 ENCOUNTER — Other Ambulatory Visit: Payer: Self-pay | Admitting: Family Medicine

## 2020-02-18 ENCOUNTER — Other Ambulatory Visit: Payer: Self-pay | Admitting: Family Medicine

## 2020-02-22 ENCOUNTER — Telehealth: Payer: Self-pay

## 2020-02-22 ENCOUNTER — Other Ambulatory Visit: Payer: Self-pay

## 2020-02-22 NOTE — Telephone Encounter (Signed)
Request sent to PCP

## 2020-02-22 NOTE — Telephone Encounter (Signed)
Please advise, pt would like to know if she can receive a 30 d/s due to no health insurance coverage until Feb 2022. Medications pending.  RF request for trazadone & prozac LOV:03/01/2019 Next ov: no Last written: 08/05/18 (180,3) 09/08/18 (90,3)

## 2020-02-22 NOTE — Telephone Encounter (Signed)
Patient said she asked [facility] to fill 2 meds on Friday 12/31, and has not heard back from our office.  Patient stated that she just started a new job a few weeks ago and will not have medical insurance until Feb 2022.  She is trying to hold off any medical appts until then.  Patient asked if Dr. Milinda Cave would approve 30 d/s of meds below.    FLUoxetine (PROZAC) 20 MG capsule [[id number]]   traZODone (DESYREL) 50 MG tablet [[id number]]   Please be careful of sending meds to other locations. Patient request to go to [facility]. [facility] [address], [place] - [place] [address]  Thank you!

## 2020-02-23 MED ORDER — TRAZODONE HCL 50 MG PO TABS: 50.0000 mg | ORAL_TABLET | Freq: Every evening | ORAL | 0 refills | Status: DC | PRN

## 2020-02-23 MED ORDER — FLUOXETINE HCL 20 MG PO CAPS: 20.0000 mg | ORAL_CAPSULE | Freq: Every day | ORAL | 0 refills | Status: DC

## 2020-05-24 ENCOUNTER — Ambulatory Visit: Payer: Self-pay | Admitting: Family Medicine

## 2020-06-01 ENCOUNTER — Ambulatory Visit: Payer: Self-pay | Admitting: Family Medicine

## 2020-07-19 DIAGNOSIS — U071 COVID-19: Secondary | ICD-10-CM

## 2020-07-19 HISTORY — DX: COVID-19: U07.1

## 2020-08-11 ENCOUNTER — Telehealth: Payer: Self-pay

## 2020-08-11 MED ORDER — NIRMATRELVIR/RITONAVIR (PAXLOVID)TABLET: 3.0000 | ORAL_TABLET | Freq: Two times a day (BID) | ORAL | 0 refills | Status: AC

## 2020-08-11 MED ORDER — LEVOTHYROXINE SODIUM 100 MCG PO TABS: ORAL_TABLET | ORAL | 0 refills | Status: DC

## 2020-08-11 MED ORDER — IPRATROPIUM-ALBUTEROL 0.5-2.5 (3) MG/3ML IN SOLN: 3.0000 mL | Freq: Four times a day (QID) | RESPIRATORY_TRACT | 0 refills | Status: AC | PRN

## 2020-08-11 NOTE — Telephone Encounter (Signed)
Patient refill request.   Please make sure goes to correct pharmacy.  ipratropium-albuterol (DUONEB) 0.5-2.5 (3) MG/3ML SOLN [[id number]]   levothyroxine (SYNTHROID) 100 MCG tablet [[id number]]    [facility] [place]

## 2020-08-11 NOTE — Telephone Encounter (Signed)
LM for pt to return call regarding rx.  

## 2020-08-11 NOTE — Telephone Encounter (Addendum)
Spoke with pt regarding refills; she just tested positive with at home test last night. Current symptoms are cough and chest congestion. Tried using robitussin and mucinex but mucinex was making things worse. Just started working and has new insurance, she will try to make an appt in the next couple of weeks. Rx sent for 30 d supply   She would like something for cough. Advised no guarantee without appt. Please Advise.

## 2020-08-11 NOTE — Telephone Encounter (Signed)
Confirmed with the pharmacy, refill for duoneb and rx for paxlovid picked up but not levothyroxine refill.

## 2020-08-11 NOTE — Telephone Encounter (Signed)
Paxlovid eRx'd to [facility] in [place].  (Her GFR is 100.  RF's for complications from covid->tobacco dependence, mild persistent asthma.)

## 2020-11-23 ENCOUNTER — Emergency Department (HOSPITAL_COMMUNITY): Payer: 59

## 2020-11-23 ENCOUNTER — Emergency Department (HOSPITAL_COMMUNITY)
Admission: EM | Admit: 2020-11-23 | Discharge: 2020-11-23 | Disposition: A | Discharge status: Home / Self Care | Payer: 59 | Attending: Emergency Medicine | Admitting: Emergency Medicine

## 2020-11-23 ENCOUNTER — Telehealth: Payer: Self-pay | Admitting: Family Medicine

## 2020-11-23 ENCOUNTER — Other Ambulatory Visit: Payer: Self-pay

## 2020-11-23 ENCOUNTER — Emergency Department (HOSPITAL_BASED_OUTPATIENT_CLINIC_OR_DEPARTMENT_OTHER): Payer: 59

## 2020-11-23 ENCOUNTER — Encounter (HOSPITAL_COMMUNITY): Payer: Self-pay

## 2020-11-23 DIAGNOSIS — J069 Acute upper respiratory infection, unspecified: Secondary | ICD-10-CM | POA: Insufficient documentation

## 2020-11-23 DIAGNOSIS — Z8541 Personal history of malignant neoplasm of cervix uteri: Secondary | ICD-10-CM | POA: Diagnosis not present

## 2020-11-23 DIAGNOSIS — M79602 Pain in left arm: Secondary | ICD-10-CM | POA: Insufficient documentation

## 2020-11-23 DIAGNOSIS — R519 Headache, unspecified: Secondary | ICD-10-CM | POA: Diagnosis present

## 2020-11-23 DIAGNOSIS — F17210 Nicotine dependence, cigarettes, uncomplicated: Secondary | ICD-10-CM | POA: Insufficient documentation

## 2020-11-23 DIAGNOSIS — Z8616 Personal history of COVID-19: Secondary | ICD-10-CM | POA: Diagnosis not present

## 2020-11-23 DIAGNOSIS — R5383 Other fatigue: Secondary | ICD-10-CM

## 2020-11-23 DIAGNOSIS — M79662 Pain in left lower leg: Secondary | ICD-10-CM | POA: Diagnosis not present

## 2020-11-23 DIAGNOSIS — R202 Paresthesia of skin: Secondary | ICD-10-CM | POA: Insufficient documentation

## 2020-11-23 DIAGNOSIS — Z20822 Contact with and (suspected) exposure to covid-19: Secondary | ICD-10-CM | POA: Insufficient documentation

## 2020-11-23 DIAGNOSIS — E039 Hypothyroidism, unspecified: Secondary | ICD-10-CM | POA: Insufficient documentation

## 2020-11-23 DIAGNOSIS — J4540 Moderate persistent asthma, uncomplicated: Secondary | ICD-10-CM | POA: Insufficient documentation

## 2020-11-23 LAB — URINALYSIS, ROUTINE W REFLEX MICROSCOPIC
Bilirubin Urine: NEGATIVE
Glucose, UA: NEGATIVE mg/dL
Hgb urine dipstick: NEGATIVE
Ketones, ur: NEGATIVE mg/dL
Leukocytes,Ua: NEGATIVE
Nitrite: NEGATIVE
Protein, ur: NEGATIVE mg/dL
Specific Gravity, Urine: 1.002 — ABNORMAL LOW (ref 1.005–1.030)
pH: 6 (ref 5.0–8.0)

## 2020-11-23 LAB — BASIC METABOLIC PANEL
Anion gap: 8 (ref 5–15)
BUN: 10 mg/dL (ref 6–20)
CO2: 30 mmol/L (ref 22–32)
Calcium: 10 mg/dL (ref 8.9–10.3)
Chloride: 101 mmol/L (ref 98–111)
Creatinine, Ser: 0.75 mg/dL (ref 0.44–1.00)
GFR, Estimated: >60 mL/min (ref >60)
Glucose, Bld: 143 mg/dL — ABNORMAL HIGH (ref 70–99)
Potassium: 4.7 mmol/L (ref 3.5–5.1)
Sodium: 139 mmol/L (ref 135–145)

## 2020-11-23 LAB — CBC WITH DIFFERENTIAL/PLATELET
Abs Immature Granulocytes: 0.01 10*3/uL (ref 0.00–0.07)
Basophils Absolute: 0.1 10*3/uL (ref 0.0–0.1)
Basophils Relative: 1 %
Eosinophils Absolute: 0.4 10*3/uL (ref 0.0–0.5)
Eosinophils Relative: 6 %
HCT: 46.2 % — ABNORMAL HIGH (ref 36.0–46.0)
Hemoglobin: 15.2 g/dL — ABNORMAL HIGH (ref 12.0–15.0)
Immature Granulocytes: 0 %
Lymphocytes Relative: 32 %
Lymphs Abs: 2.1 10*3/uL (ref 0.7–4.0)
MCH: 28 pg (ref 26.0–34.0)
MCHC: 32.9 g/dL (ref 30.0–36.0)
MCV: 85.1 fL (ref 80.0–100.0)
Monocytes Absolute: 0.3 10*3/uL (ref 0.1–1.0)
Monocytes Relative: 4 %
Neutro Abs: 3.7 10*3/uL (ref 1.7–7.7)
Neutrophils Relative %: 57 %
Platelets: 269 10*3/uL (ref 150–400)
RBC: 5.43 MIL/uL — ABNORMAL HIGH (ref 3.87–5.11)
RDW: 13.2 % (ref 11.5–15.5)
WBC: 6.5 10*3/uL (ref 4.0–10.5)
nRBC: 0 % (ref 0.0–0.2)

## 2020-11-23 LAB — RESP PANEL BY RT-PCR (FLU A&B, COVID) ARPGX2
Influenza A by PCR: NEGATIVE
Influenza B by PCR: NEGATIVE
SARS Coronavirus 2 by RT PCR: NEGATIVE

## 2020-11-23 LAB — HEPATIC FUNCTION PANEL
ALT: 23 U/L (ref 0–44)
AST: 19 U/L (ref 15–41)
Albumin: 3.7 g/dL (ref 3.5–5.0)
Alkaline Phosphatase: 63 U/L (ref 38–126)
Bilirubin, Direct: 0.2 mg/dL (ref 0.0–0.2)
Indirect Bilirubin: 0.3 mg/dL (ref 0.3–0.9)
Total Bilirubin: 0.5 mg/dL (ref 0.3–1.2)
Total Protein: 6.2 g/dL — ABNORMAL LOW (ref 6.5–8.1)

## 2020-11-23 LAB — TROPONIN I (HIGH SENSITIVITY)
Troponin I (High Sensitivity): 3 ng/L (ref <18)
Troponin I (High Sensitivity): 3 ng/L (ref <18)

## 2020-11-23 LAB — TSH: TSH: 1.649 u[IU]/mL (ref 0.350–4.500)

## 2020-11-23 LAB — LIPASE, BLOOD: Lipase: 26 U/L (ref 11–51)

## 2020-11-23 IMAGING — CR DG CHEST 2V
2 series · 2 of 2 positions shown · non-contrast
Comparison: Chest radiograph [DATE]

CLINICAL DATA: Left-sided chest pain and shortness of breath.

EXAM:
CHEST - 2 VIEW

[chest pa]
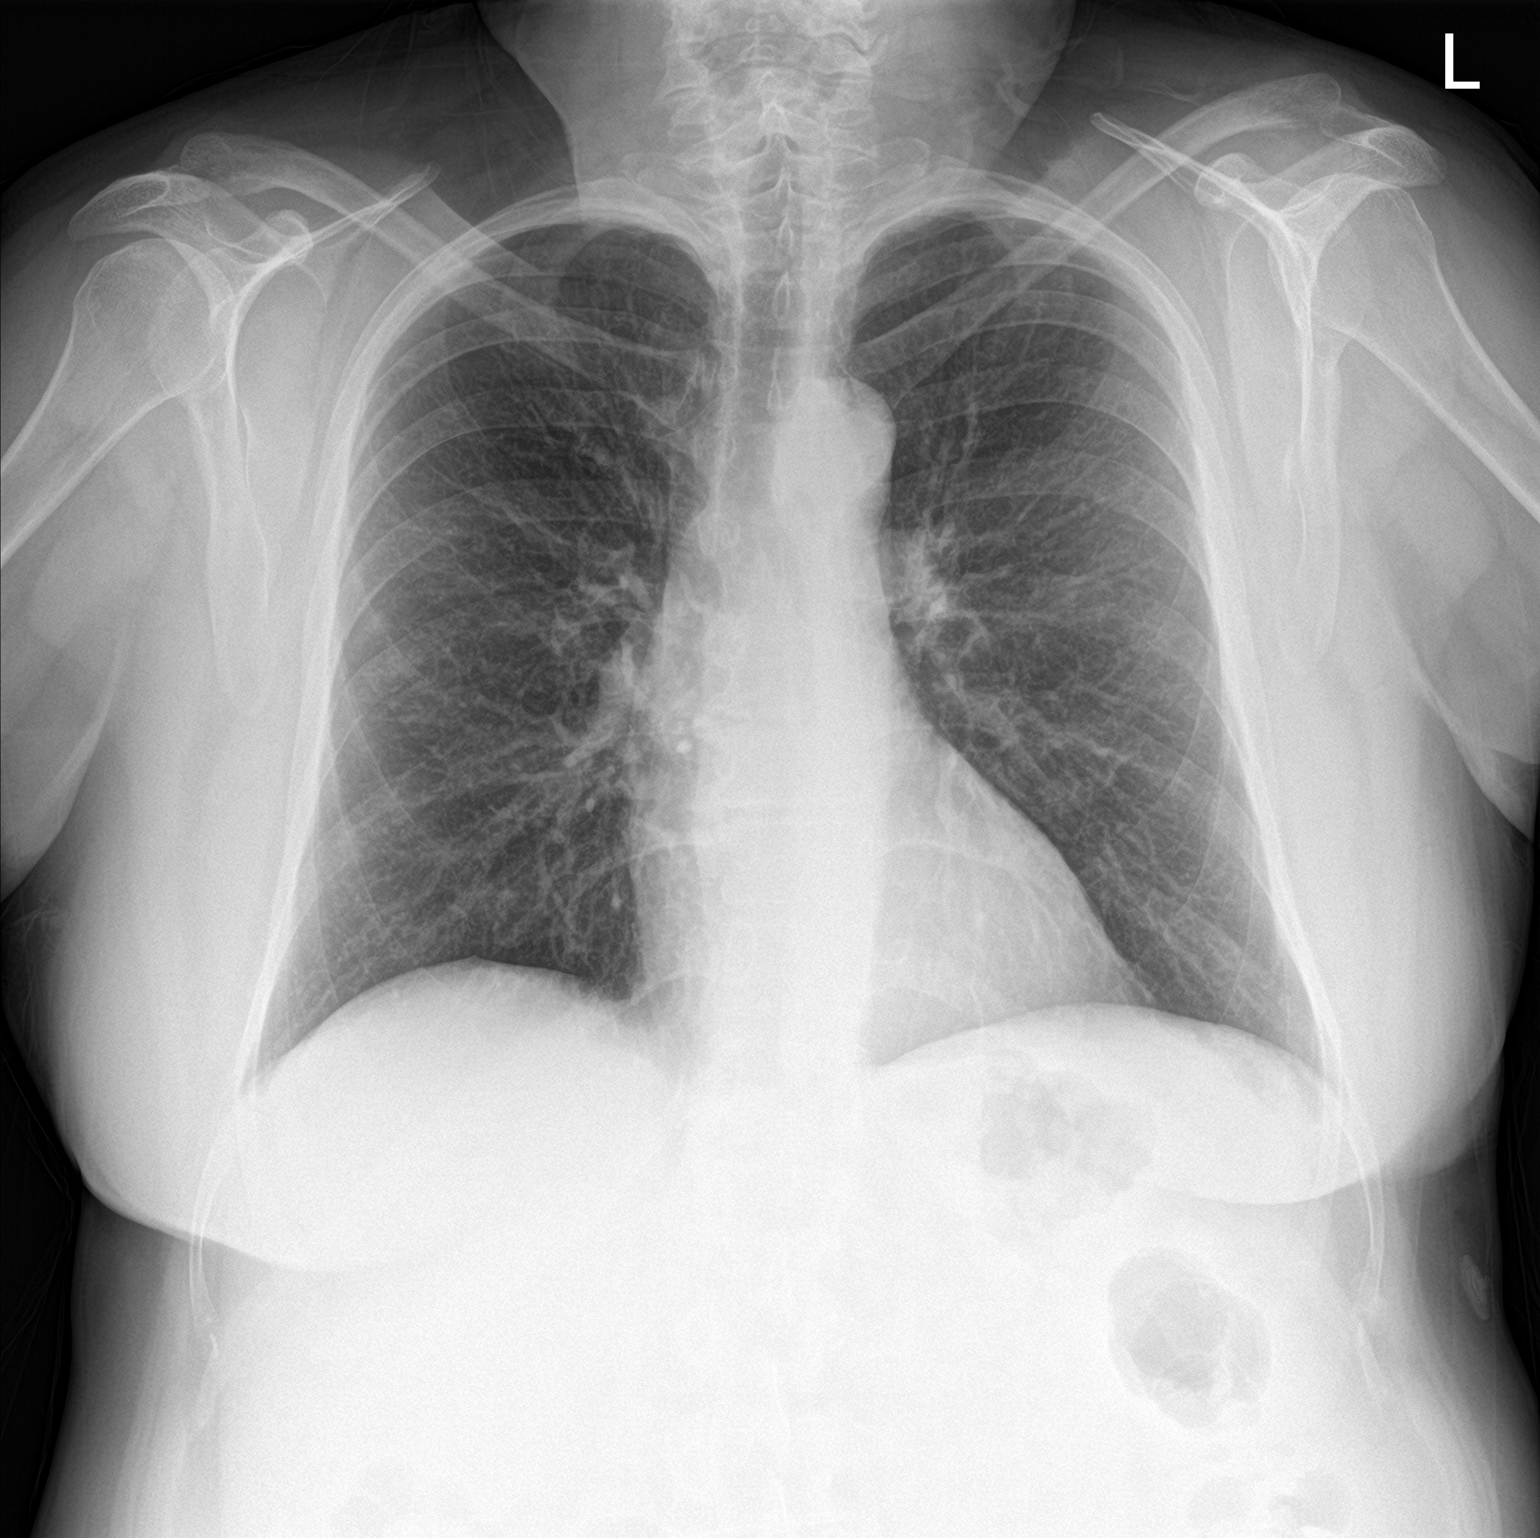

[chest lat]
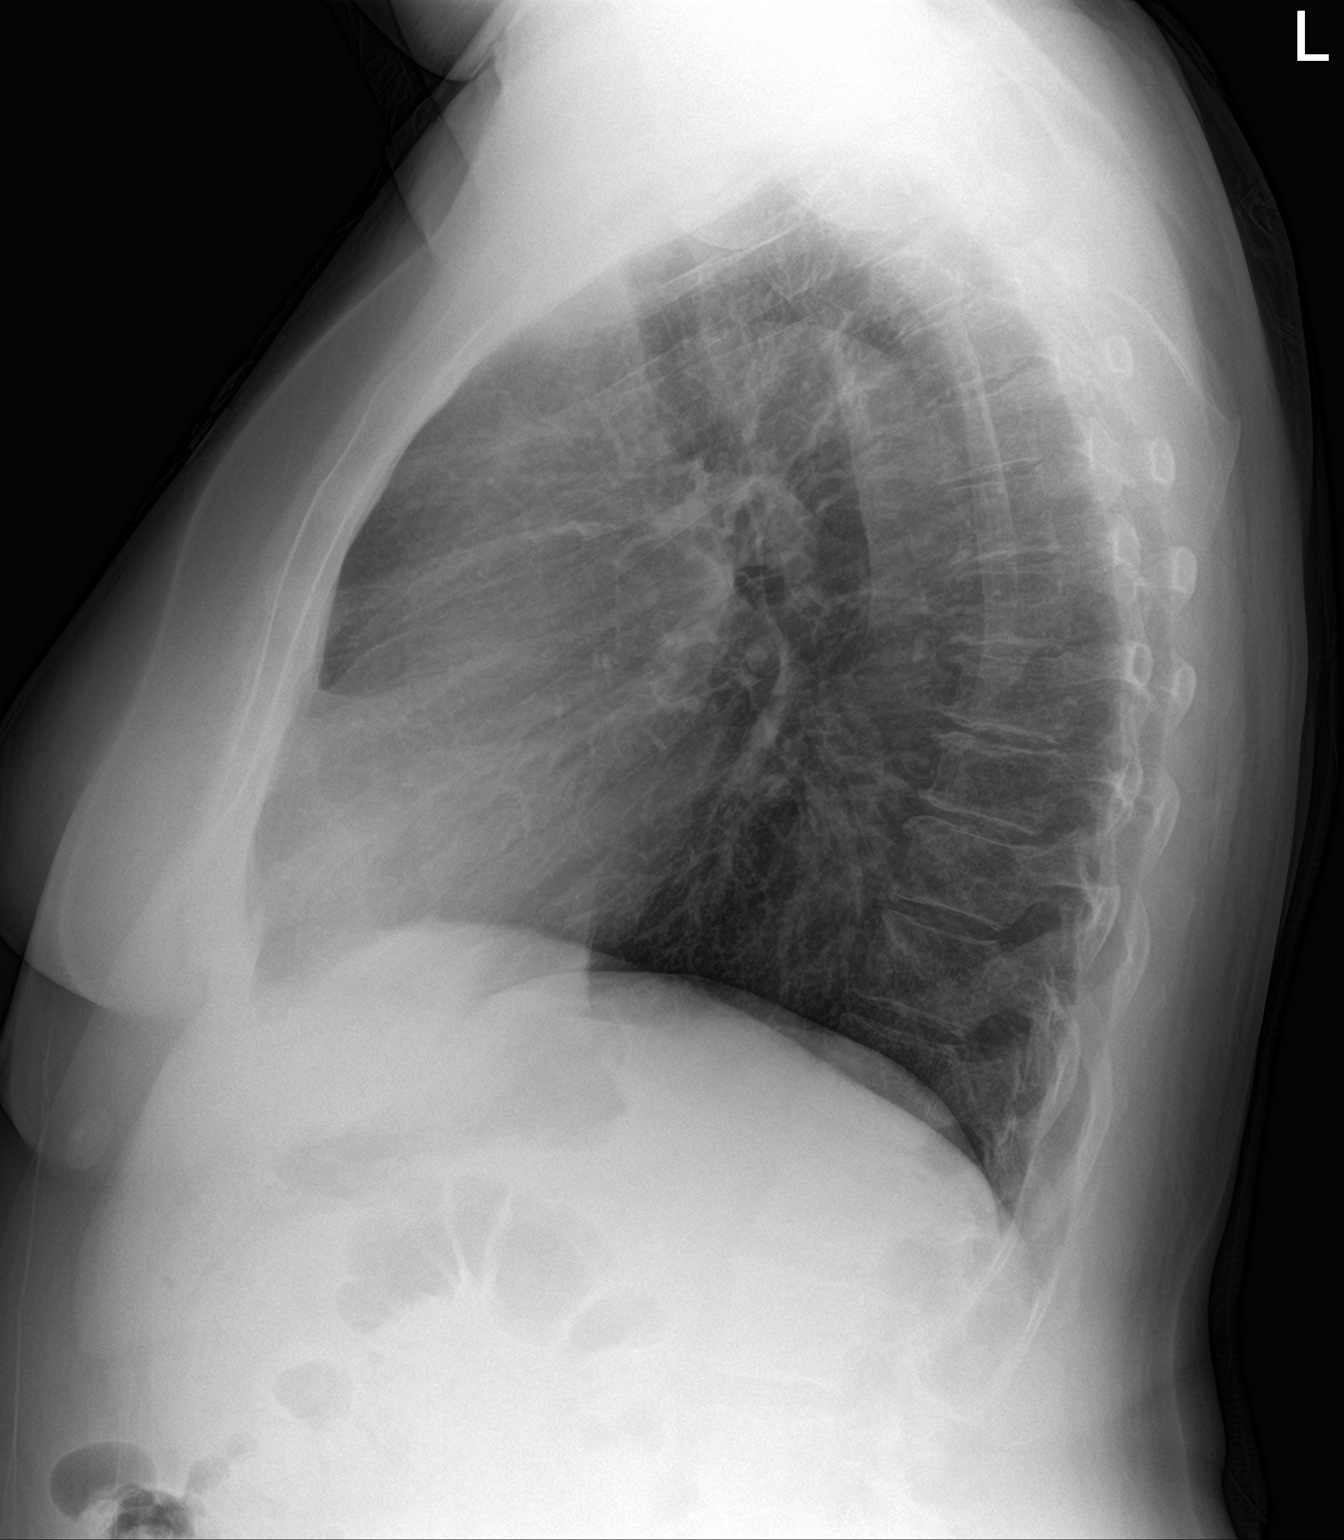

[2 of 2 positions shown; findings below may reference images not displayed]

FINDINGS: The heart size and mediastinal contours are within normal limits. A
subcentimeter hazy nodular airspace opacity is noted at the lateral
aspect of the right mid hemithorax. The lungs are otherwise clear.
No pleural effusion or pneumothorax. Multilevel degenerative disc
disease in the midthoracic spine.
IMPRESSION: Subtle subcentimeter hazy nodular airspace opacity in the peripheral
aspect of right lung may be infectious versus inflammatory in
etiology. The lungs are otherwise clear. Recommend repeat radiograph
in 6-8 weeks to ensure resolution given patient's history of
smoking.

## 2020-11-23 IMAGING — MR MR CERVICAL SPINE W/O CM
4 of 5 series · 18 of 48 positions shown · non-contrast
Comparison: X-ray [DATE]

CLINICAL DATA: Left arm numbness

EXAM:
MRI CERVICAL SPINE WITHOUT CONTRAST
TECHNIQUE: Multiplanar, multisequence MR imaging of the cervical spine was
performed. No intravenous contrast was administered.

[Series 12: T2 · sagittal · 3.0mm · 0.43mm/px · 6 of 12 slices shown (1 of 2)]
[im 1/12]
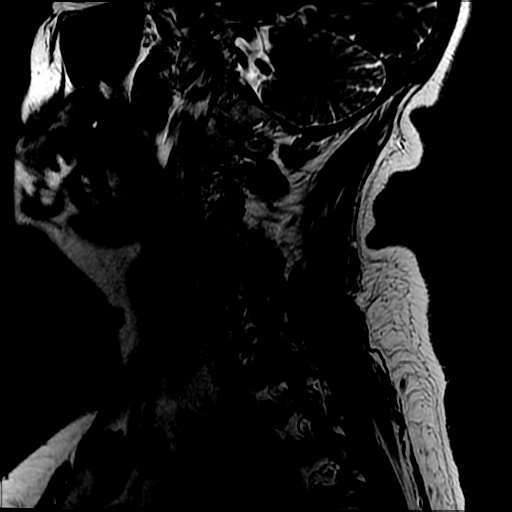
[im 3/12]
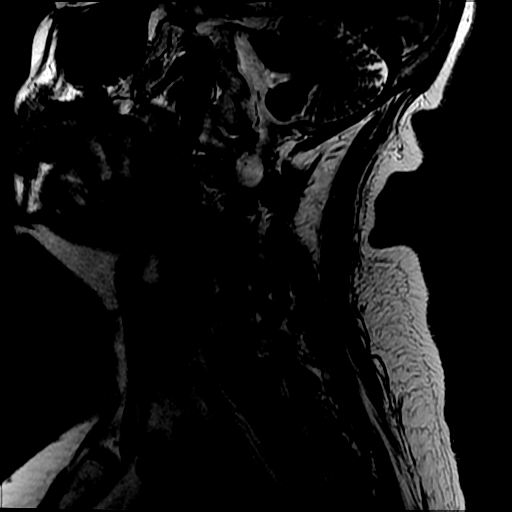
[im 5/12]
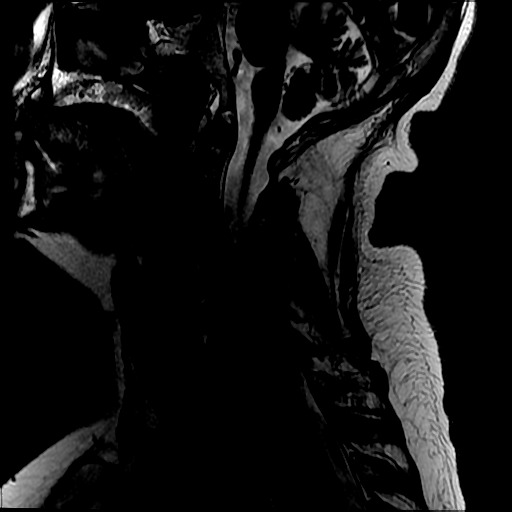
[im 7/12]
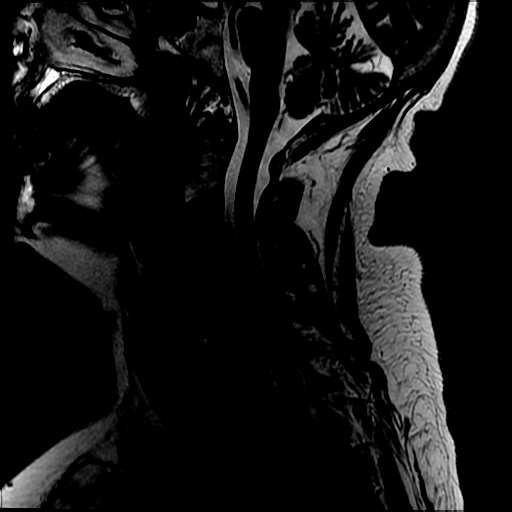
[im 9/12]
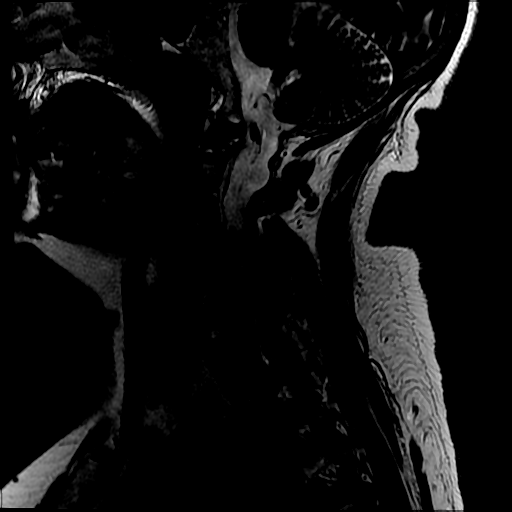
[im 12/12]
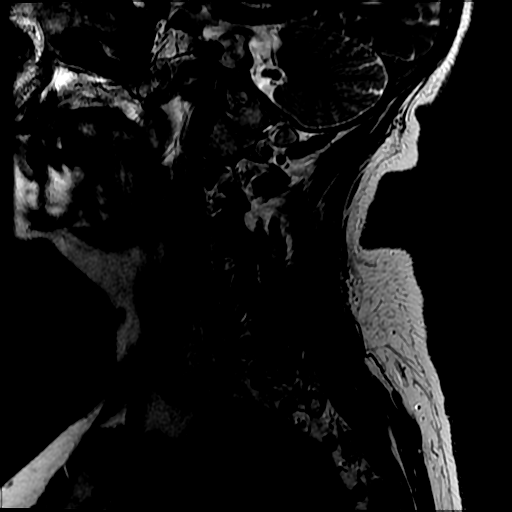

[Series 13: T1 · sagittal · 3.0mm · 0.43mm/px · 3 of 12 slices shown]
[im 3/12]
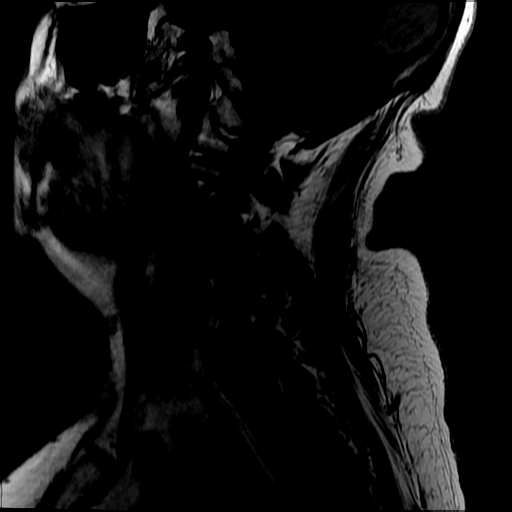
[im 7/12]
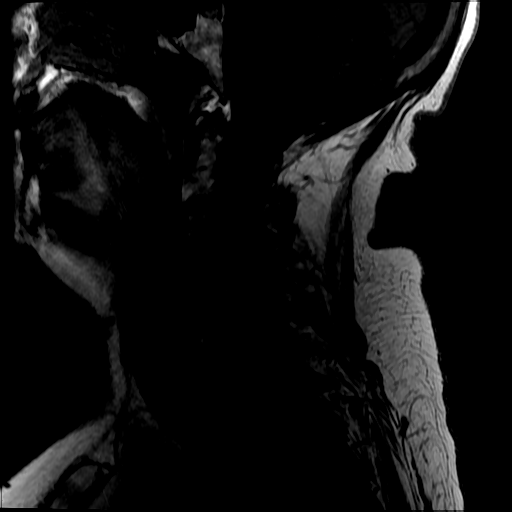
[im 12/12]
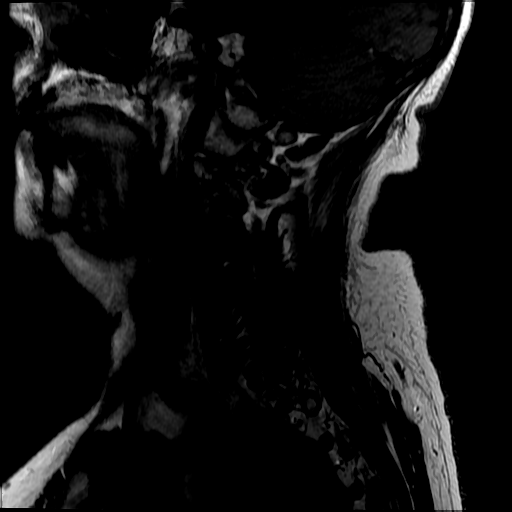

[Series 14: sag ir · sagittal · 3.0mm · 0.43mm/px · 3 of 12 slices shown]
[im 3/12]
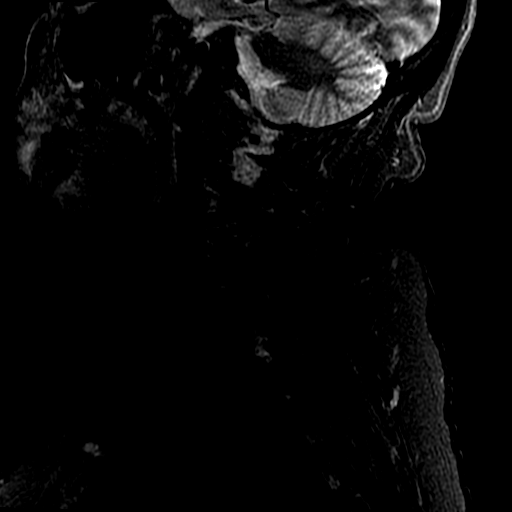
[im 7/12]
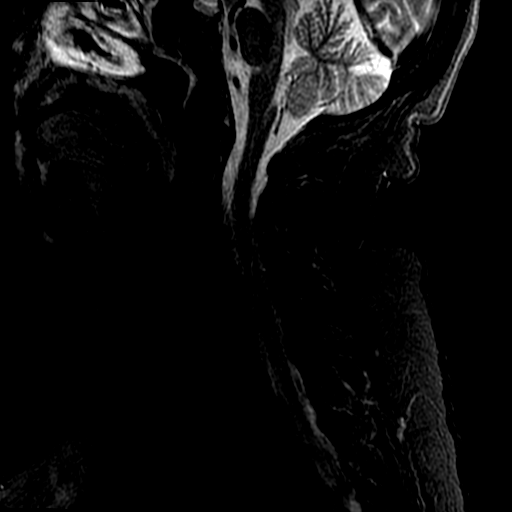
[im 12/12]
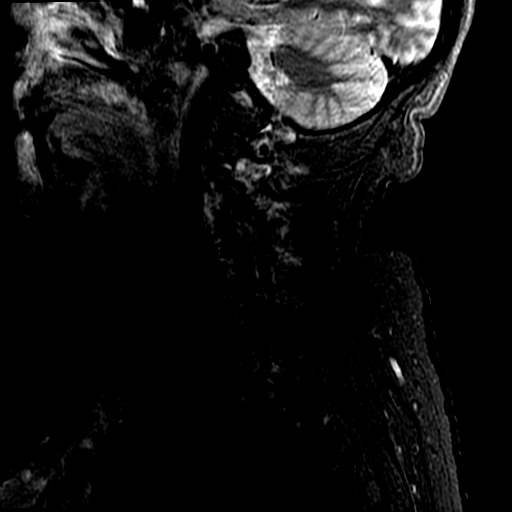

[Series 16: T2 · axial · 3.0mm · 0.39mm/px · z∈[-205,-130]mm · 6 of 30 slices shown (2 of 2)]
[im 1/30]
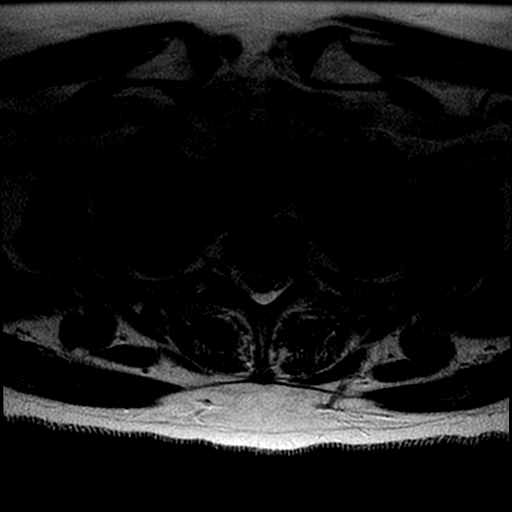
[im 5/30]
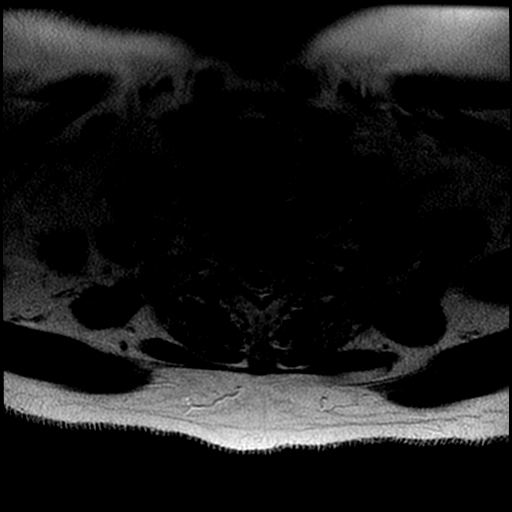
[im 9/30]
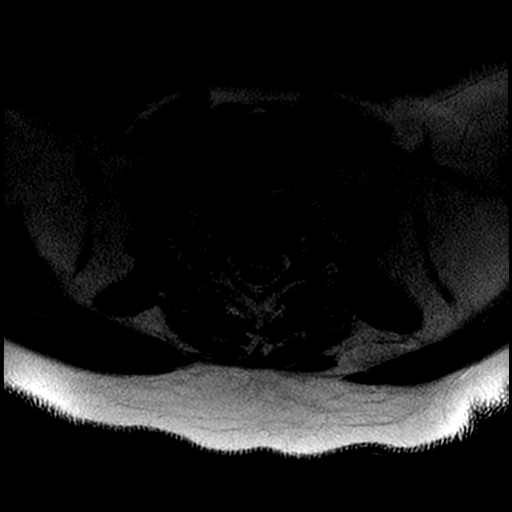
[im 13/30]
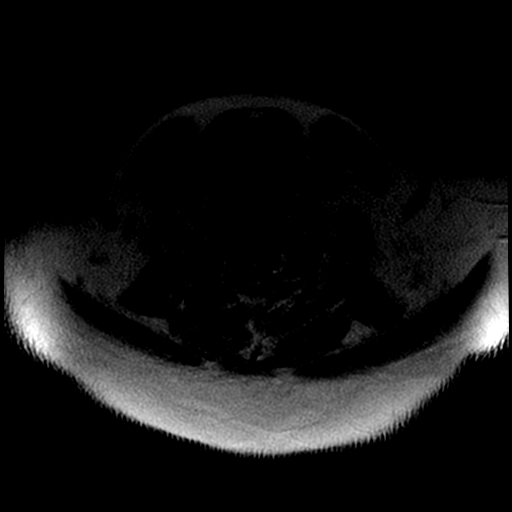
[im 15/30]
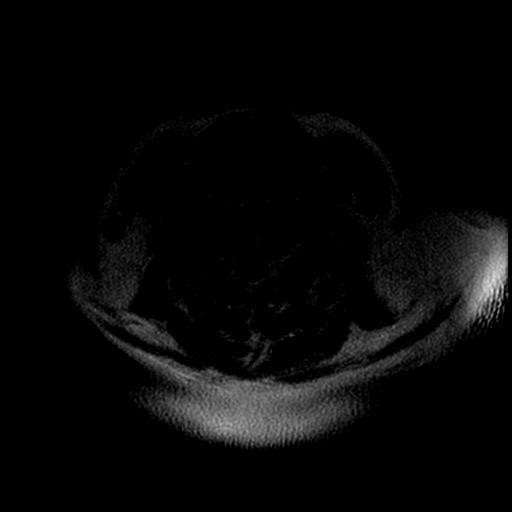
[im 25/30]
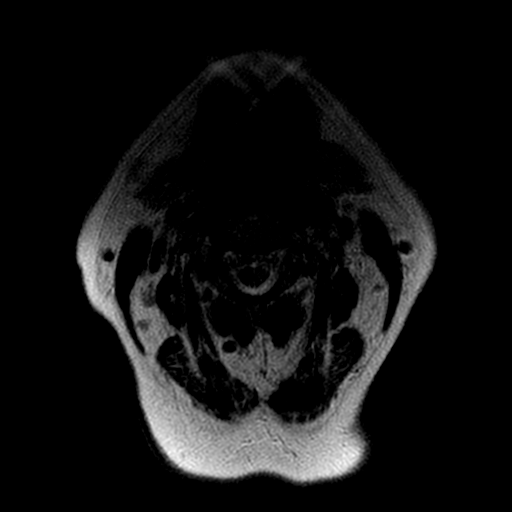

[18 of 48 positions shown; findings below may reference images not displayed]

FINDINGS: Technical Note: Despite efforts by the technologist and patient,
motion artifact is present on today's exam and could not be
eliminated. This reduces exam sensitivity and specificity.

Alignment: No significant static listhesis.

Vertebrae: No fracture, evidence of discitis, or bone lesion.

Cord: No evidence of focal cord signal abnormality on motion
degraded images.

Posterior Fossa, vertebral arteries, paraspinal tissues: Please
refer to dedicated concurrent MRI of the brain for evaluation of the
posterior fossa structures. Vertebral artery flow voids appear
preserved. No paravertebral abnormalities identified.

Disc levels:

C2-C3: No disc protrusion. Unremarkable facet joints. No foraminal
or canal stenosis.

C3-C4: Mild disc osteophyte complex with left greater than right
uncovertebral spurring and bilateral facet arthropathy.
Mild-to-moderate bilateral foraminal stenosis. No canal stenosis.

C4-C5: Mild disc osteophyte complex with left greater than right
facet and uncovertebral arthropathy resulting in mild-to-moderate
left foraminal stenosis. No canal stenosis.

C5-C6: Disc osteophyte complex with bilateral facet and
uncovertebral arthropathy results in mild canal stenosis with
moderate bilateral foraminal stenosis.

C6-C7: Mild disc osteophyte complex and minimal facet arthropathy.
There may be mild bilateral foraminal stenosis. No evidence of
significant canal stenosis.

C7-T1: Small left paracentral disc protrusion without evidence of
foraminal or canal stenosis.
IMPRESSION: 1. Motion degraded examination.
2. No acute osseous abnormality or evidence of cord signal
abnormality.
3. Multilevel degenerative changes of the cervical spine, most
pronounced at the C5-6 level where there is mild canal stenosis and
moderate bilateral foraminal stenosis.

## 2020-11-23 IMAGING — MR MR HEAD W/O CM
7 of 8 series · 33 of 48 positions shown · non-contrast
Comparison: None.

CLINICAL DATA: Neuro deficit, acute, stroke suspected.

EXAM:
MRI HEAD WITHOUT CONTRAST
TECHNIQUE: Multiplanar, multiecho pulse sequences of the brain and surrounding
structures were obtained without intravenous contrast.

[Series 3: DWI · axial · 3.0mm · 1.09mm/px · z∈[-75,+74]mm · 11 of 102 slices shown (1 of 2)]
[im 1/102]
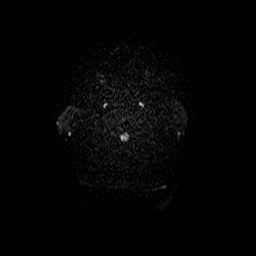
[im 11/102]
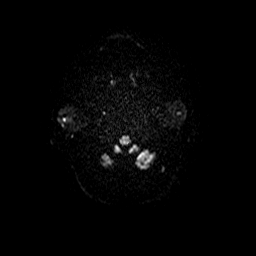
[im 21/102]
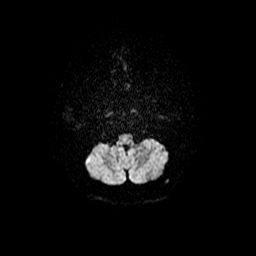
[im 31/102]
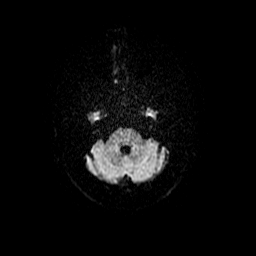
[im 41/102]
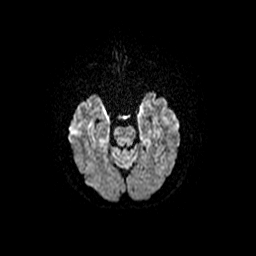
[im 51/102]
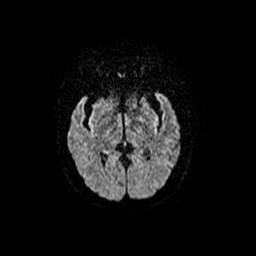
[im 61/102]
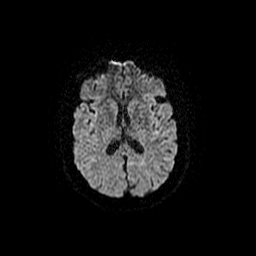
[im 71/102]
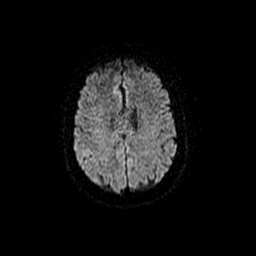
[im 81/102]
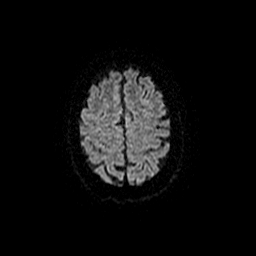
[im 91/102]
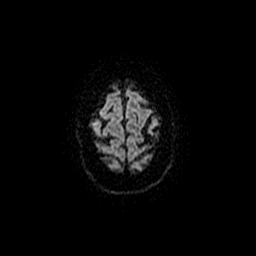
[im 102/102]
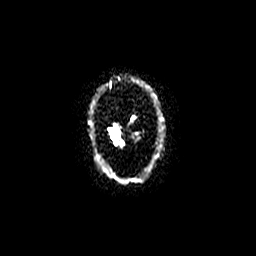

[Series 4: DWI · coronal · 5.0mm · 1.09mm/px · 9 of 78 slices shown (2 of 2)]
[im 1/78]
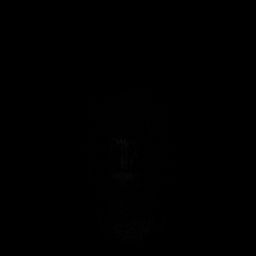
[im 10/78]
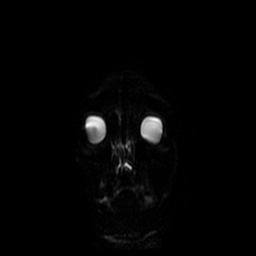
[im 20/78]
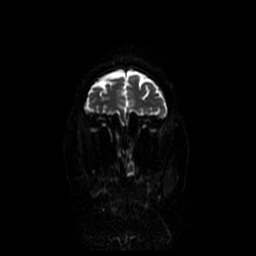
[im 29/78]
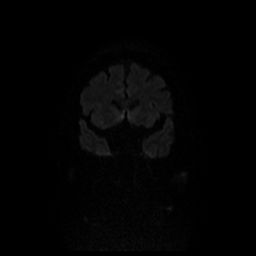
[im 39/78]
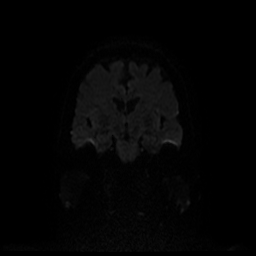
[im 49/78]
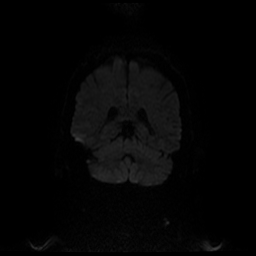
[im 58/78]
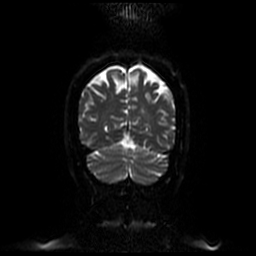
[im 68/78]
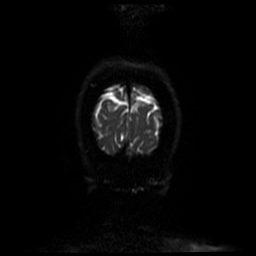
[im 78/78]
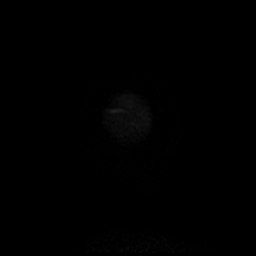

[Series 5: T1 · sagittal · 5.0mm · 0.47mm/px · 3 of 23 slices shown (1 of 2)]
[im 1/23]
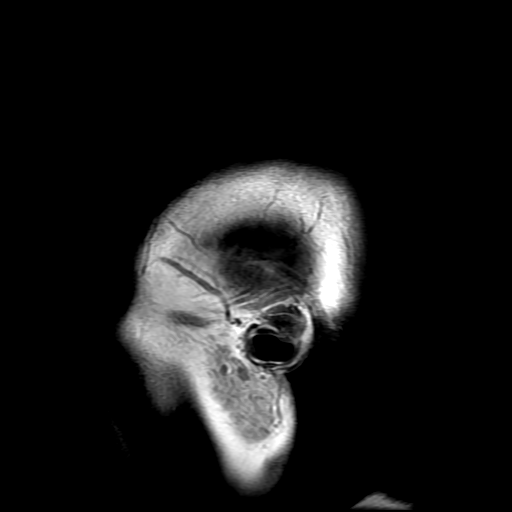
[im 12/23]
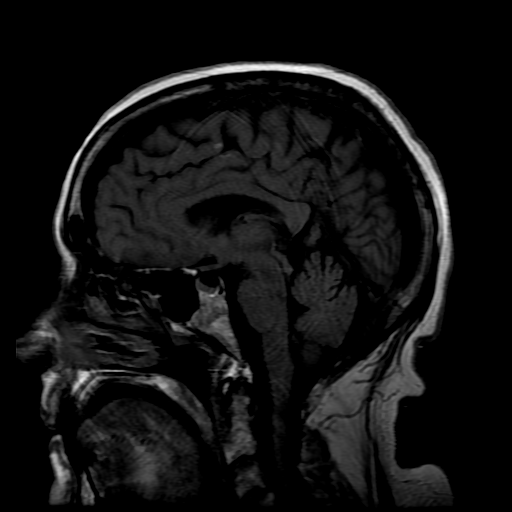
[im 23/23]
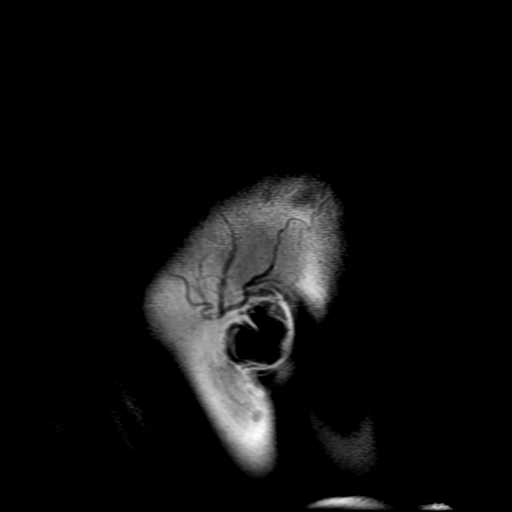

[Series 6: T2 · axial · 5.0mm · 0.43mm/px · z∈[-67,+95]mm · 3 of 28 slices shown]
[im 1/28]
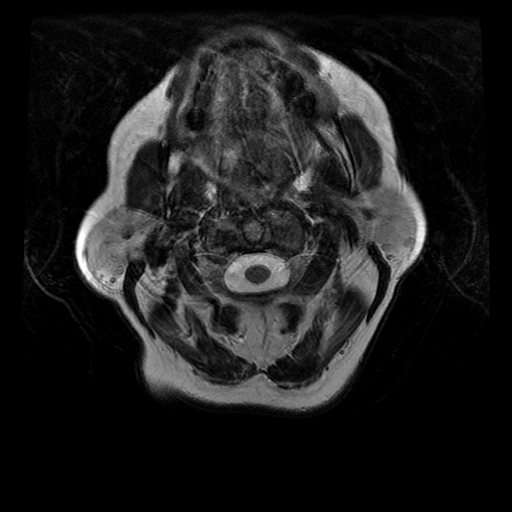
[im 14/28]
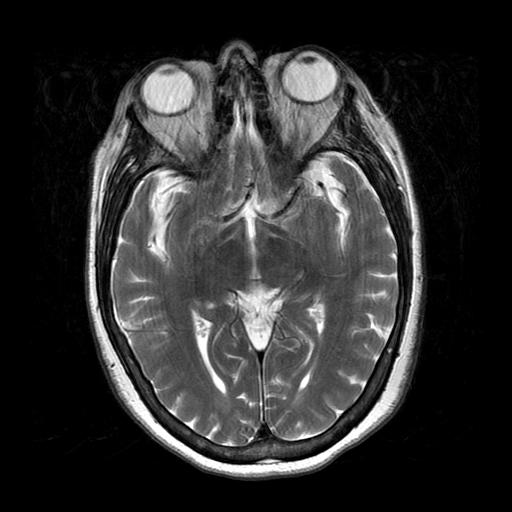
[im 28/28]
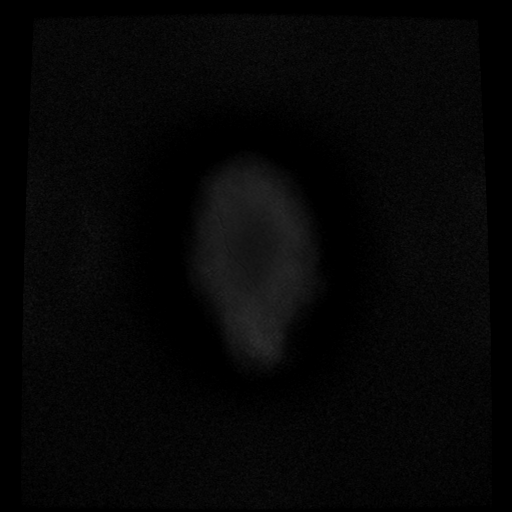

[Series 7: FLAIR · axial · 3.0mm · 0.43mm/px · z∈[-67,+95]mm · 3 of 28 slices shown]
[im 1/28]
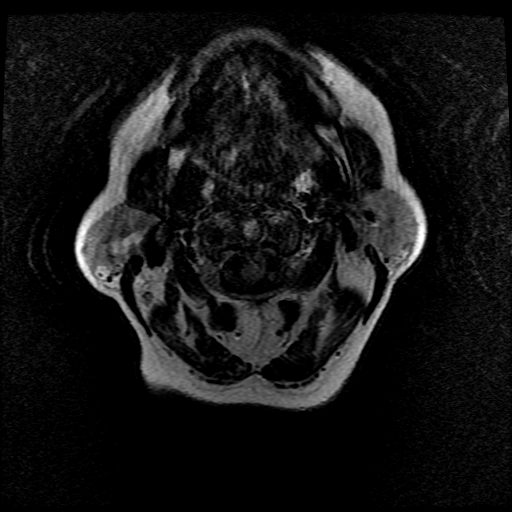
[im 14/28]
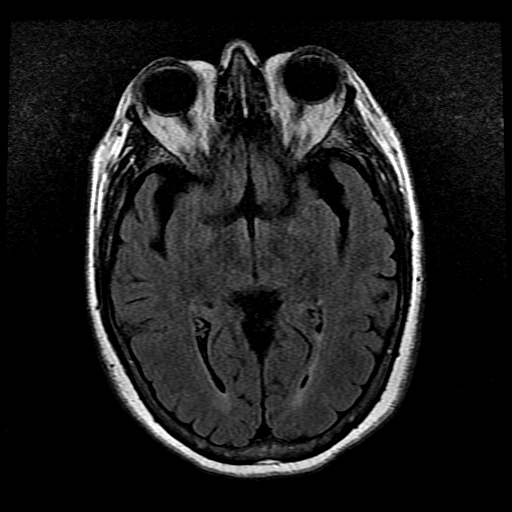
[im 28/28]
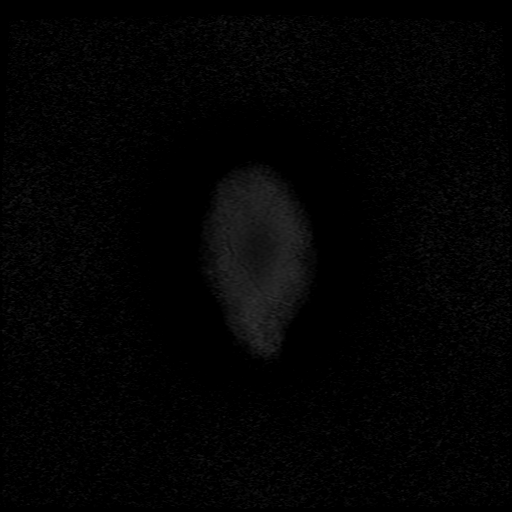

[Series 9: T1 · axial · 3.0mm · 0.43mm/px · 1 of 112 slices shown (2 of 2)]
[im 1/112]
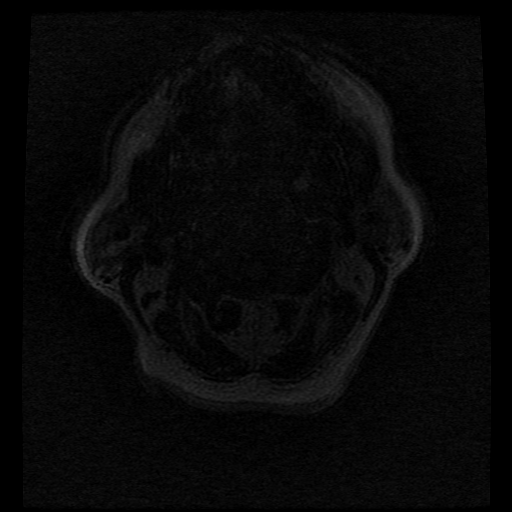

[Series 10: T2 post-contrast · coronal · 5.0mm · 0.55mm/px · 3 of 30 slices shown]
[im 1/30]
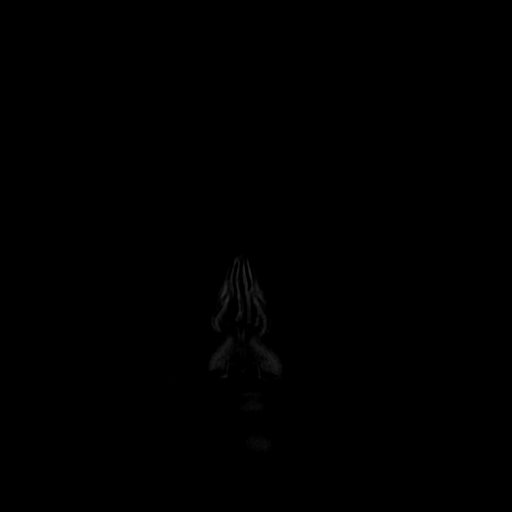
[im 15/30]
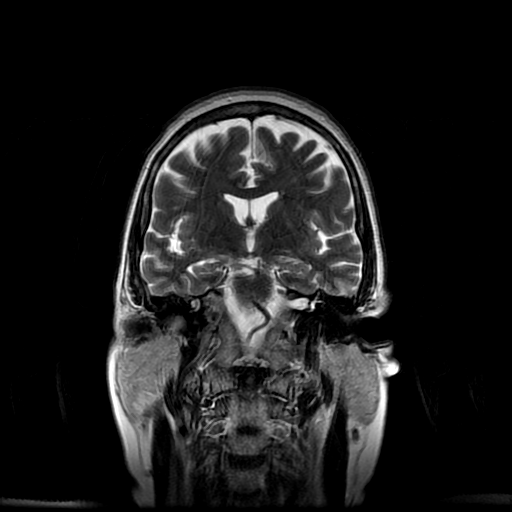
[im 30/30]
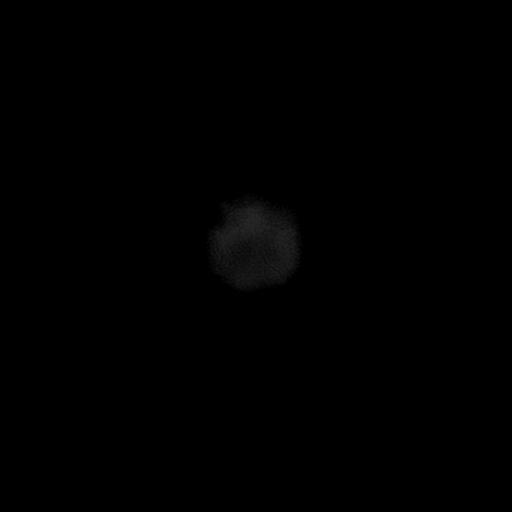

[33 of 48 positions shown; findings below may reference images not displayed]

FINDINGS: Brain: No acute infarction, hemorrhage, hydrocephalus, extra-axial
collection or mass lesion. Small amount of T2 hyperintense lesions
within the white matter of the cerebral hemispheres in a nonspecific
pattern. Mild parenchymal volume loss.

Vascular: Normal flow voids.

Skull and upper cervical spine: Normal marrow signal.

Sinuses/Orbits: Negative.

Other: None.
IMPRESSION: 1. No acute intracranial abnormality.
2. Small amount of nonspecific T2 hyperintense lesions of the white
matter. Differential diagnosis include early chronic
microangiopathy, demyelinating disease, vasculitis and other post
inflammatory/infectious processes.

## 2020-11-23 MED ORDER — DOXYCYCLINE HYCLATE 100 MG PO CAPS: 100.0000 mg | ORAL_CAPSULE | Freq: Two times a day (BID) | ORAL | 0 refills | Status: DC

## 2020-11-23 MED ORDER — ALBUTEROL SULFATE HFA 108 (90 BASE) MCG/ACT IN AERS: 1.0000 | INHALATION_SPRAY | Freq: Four times a day (QID) | RESPIRATORY_TRACT | 0 refills | Status: DC | PRN

## 2020-11-23 MED ORDER — LORAZEPAM 2 MG/ML IJ SOLN
1.0000 mg | Freq: Once | INTRAMUSCULAR | Status: AC | PRN
  Administered 2020-11-23: 1 mg via INTRAVENOUS
  Filled 2020-11-23: qty 1

## 2020-11-23 MED ORDER — ACETAMINOPHEN 325 MG PO TABS
650.0000 mg | ORAL_TABLET | Freq: Once | ORAL | Status: AC
  Administered 2020-11-23: 650 mg via ORAL
  Filled 2020-11-23: qty 2

## 2020-11-23 MED ORDER — BENZONATATE 100 MG PO CAPS: 100.0000 mg | ORAL_CAPSULE | Freq: Three times a day (TID) | ORAL | 0 refills | Status: DC

## 2020-11-23 MED ORDER — PREDNISONE 20 MG PO TABS: 20.0000 mg | ORAL_TABLET | Freq: Every day | ORAL | 0 refills | Status: DC

## 2020-11-23 MED ORDER — ALBUTEROL SULFATE HFA 108 (90 BASE) MCG/ACT IN AERS: 1.0000 | INHALATION_SPRAY | Freq: Four times a day (QID) | RESPIRATORY_TRACT | 0 refills | Status: AC | PRN

## 2020-11-23 NOTE — Telephone Encounter (Signed)
Pt called stating her heart has been hurting a few days, she has tingling in left arm and fingers 2-3 days, intermittent dizziness x 2 wks, BP yesterday 157/107, and yesterday felt tired and slept the whole day. Transferred to [place] with Print production planner.

## 2020-11-23 NOTE — ED Notes (Signed)
Patient transported to MRI 

## 2020-11-23 NOTE — Discharge Instructions (Addendum)
You were seen in the emergency department today with cough/congestion.  I am starting you on antibiotic along with dose of steroids.  Have also called in a refill for your albuterol inhaler.  For your tingling/numbness on the left side I would like for you to follow closely with a neurologist.  I have placed a referral in our system to help with this.  They should see you at the next available appointment.  If you develop any new or suddenly worsening symptoms he should return to the emergency department. Please also follow closely with your PCP in the coming week to review your symptoms and arrange a repeat chest x-ray to ensure that your x-ray has cleared after antibiotic treatment.

## 2020-11-23 NOTE — Progress Notes (Signed)
Lower extremity venous left study completed.  Preliminary results relayed to Long, MD.  See CV Proc for preliminary results report.   Darlin Coco, RDMS, RVT

## 2020-11-23 NOTE — Telephone Encounter (Signed)
Noted  

## 2020-11-23 NOTE — ED Provider Notes (Signed)
Emergency Department Provider Note   I have reviewed the triage vital signs and the nursing notes.   HISTORY  Chief Complaint Headache, Shortness of Breath, and Arm Pain   HPI Jamie Mercado is a 60 y.o. female with past medical history reviewed below presents to the emergency department with headache, chest tightness, left side numbness, and generalized fatigue.  Patient states that the above symptoms have been present for several weeks but have worsened in the past 3 days.  She tells me that she is feeling numbness to her left arm and leg primarily and has noticed some intermittent swelling in the left leg.  She describes some associated feeling of shortness of breath along with tightness in the chest.  She describes a left side, constant tightness for least the past 3 days.  She is also developed headache which she described as more generalized, gradual onset.  She not having any focal weakness but does feel numbness in the left arm and leg.  She describes concern with Omer Monter family history of stroke but also early heart attacks.  She does not follow with a primary care doctor regularly.  In the setting of the symptoms she took her blood pressure at home and found to be elevated but she does not take blood pressure medications. Does note a history of asthma but reports this feels different.    Past Medical History:  Diagnosis Date   Acute gastritis without bleeding Fall 2019   H pylori NEG on EGD   Cervical cancer ([place])    around age 43   Colon polyps    COVID-19 virus infection 07/2020   paxlovid   GAD (generalized anxiety disorder)    NO BENZOS DUE TO PT FAILING UDS X 2 IN 2019.   GERD (gastroesophageal reflux disease)    well controlled with ranitidine bid   Hay fever    Hypothyroidism    IBS (irritable bowel syndrome)    Insomnia    Kidney stones    Marijuana use 2019   Failed UDS x 2 2019; NO FURTHER CONTROLLED SUBSTANCES ARE TO BE PRESCRIBED FOR THIS PATIENT.    Moderate persistent asthma    Not taking controller meds b/c she said she improved significantly with cutting back on tobacco use.   Psoriasis    Tobacco dependence    ongoing as of 02/2017    Patient Active Problem List   Diagnosis Date Noted   Hypothyroidism    Psoriasis (a type of skin inflammation) 07/27/2016   Elevated BP without diagnosis of hypertension 01/23/2016   Generalized anxiety disorder 05/26/2015   Insomnia 05/26/2015    Past Surgical History:  Procedure Laterality Date   APPENDECTOMY     COLONOSCOPY  2015   endometrosis     TAH/BSO 2011   ESOPHAGOGASTRODUODENOSCOPY  12/05/2017   lower third of esoph inflamed, +gastritis (H pylori NEG).  Duodenal bx Normal.   KNEE SURGERY     TONSILLECTOMY     TOTAL ABDOMINAL HYSTERECTOMY W/ BILATERAL SALPINGOOPHORECTOMY  2011   Endometriosis   TUBAL LIGATION      Allergies Codeine  Family History  Problem Relation Age of Onset   Diabetes Father    Heart disease Father    Hypertension Father    Early death Father    Hyperlipidemia Father    Cancer Paternal Grandmother    Colon polyps Paternal Grandmother    Hyperlipidemia Mother    Hypertension Mother    Arthritis Maternal Grandmother  Diabetes Maternal Grandmother    Hypertension Maternal Grandmother    Hyperlipidemia Maternal Grandmother    Kidney disease Maternal Grandmother    Stroke Maternal Grandmother    Hypertension Maternal Grandfather    Hypertension Paternal Grandfather     Social History Social History   Tobacco Use   Smoking status: Every Day    Packs/day: 0.50    Types: Cigarettes   Smokeless tobacco: Never   Tobacco comments:    smoke less than a 0.5 pack  Vaping Use   Vaping Use: Never used  Substance Use Topics   Alcohol use: Yes    Comment: socially   Drug use: No    Review of Systems  Constitutional: No fever/chills. Positive fatigue.  Eyes: No visual changes. ENT: No sore throat. Cardiovascular: Positive chest  pain. Respiratory: Positive shortness of breath and cough.  Gastrointestinal: No abdominal pain.  No nausea, no vomiting.  No diarrhea.  No constipation. Genitourinary: Negative for dysuria. Musculoskeletal: Negative for back pain. Skin: Negative for rash. Neurological: Negative for focal weakness. Positive HA and left sided numbness.   10-point ROS otherwise negative.  ____________________________________________   PHYSICAL EXAM:  VITAL SIGNS: ED Triage Vitals  Enc Vitals Group     BP 11/23/20 0951 (!) 148/118     Pulse Rate 11/23/20 0951 71     Resp 11/23/20 0951 18     Temp 11/23/20 0951 97.8 F (36.6 C)     Temp Source 11/23/20 0951 Oral     SpO2 11/23/20 0951 100 %   Constitutional: Alert and oriented. Well appearing and in no acute distress. Eyes: Conjunctivae are normal.  Head: Atraumatic. Nose: No congestion/rhinnorhea. Mouth/Throat: Mucous membranes are moist.   Neck: No stridor.   Cardiovascular: Normal rate, regular rhythm. Good peripheral circulation. Grossly normal heart sounds.  Respiratory: Normal respiratory effort.  No retractions. Lungs CTAB. Gastrointestinal: Soft and nontender. No distention.  Musculoskeletal: No lower extremity tenderness nor edema. No gross deformities of extremities. Neurologic:  Normal speech and language. No gross focal neurologic deficits are appreciated.  Skin:  Skin is warm, dry and intact. No rash noted.   ____________________________________________   LABS (all labs ordered are listed, but only abnormal results are displayed)  Labs Reviewed  BASIC METABOLIC PANEL - Abnormal; Notable for the following components:      Result Value   Glucose, Bld 143 (*)    All other components within normal limits  CBC WITH DIFFERENTIAL/PLATELET - Abnormal; Notable for the following components:   RBC 5.43 (*)    Hemoglobin 15.2 (*)    HCT 46.2 (*)    All other components within normal limits  URINALYSIS, ROUTINE W REFLEX MICROSCOPIC  - Abnormal; Notable for the following components:   Color, Urine STRAW (*)    Specific Gravity, Urine 1.002 (*)    All other components within normal limits  HEPATIC FUNCTION PANEL - Abnormal; Notable for the following components:   Total Protein 6.2 (*)    All other components within normal limits  RESP PANEL BY RT-PCR (FLU A&B, COVID) ARPGX2  LIPASE, BLOOD  TSH  TROPONIN I (HIGH SENSITIVITY)  TROPONIN I (HIGH SENSITIVITY)   ____________________________________________  EKG   EKG Interpretation  Date/Time:  Thursday November 23 2020 09:49:32 EDT Ventricular Rate:  73 PR Interval:  138 QRS Duration: 100 QT Interval:  384 QTC Calculation: 423 R Axis:   47 Text Interpretation: Normal sinus rhythm Normal ECG Confirmed by Nanda Quinton [phone]) on 11/23/2020 10:43:57 AM  ____________________________________________  RADIOLOGY  DG Chest 2 View  Result Date: 11/23/2020 CLINICAL DATA:  Left-sided chest pain and shortness of breath. EXAM: CHEST - 2 VIEW COMPARISON:  Chest radiograph 10/09/2018 FINDINGS: The heart size and mediastinal contours are within normal limits. A subcentimeter hazy nodular airspace opacity is noted at the lateral aspect of the right mid hemithorax. The lungs are otherwise clear. No pleural effusion or pneumothorax. Multilevel degenerative disc disease in the midthoracic spine. IMPRESSION: Subtle subcentimeter hazy nodular airspace opacity in the peripheral aspect of right lung may be infectious versus inflammatory in etiology. The lungs are otherwise clear. Recommend repeat radiograph in 6-8 weeks to ensure resolution given patient's history of smoking. Electronically Signed   By: Ileana Roup M.D.   On: 11/23/2020 11:03   MR BRAIN WO CONTRAST  Result Date: 11/23/2020 CLINICAL DATA:  Neuro deficit, acute, stroke suspected. EXAM: MRI HEAD WITHOUT CONTRAST TECHNIQUE: Multiplanar, multiecho pulse sequences of the brain and surrounding structures were obtained  without intravenous contrast. COMPARISON:  None. FINDINGS: Brain: No acute infarction, hemorrhage, hydrocephalus, extra-axial collection or mass lesion. Small amount of T2 hyperintense lesions within the white matter of the cerebral hemispheres in a nonspecific pattern. Mild parenchymal volume loss. Vascular: Normal flow voids. Skull and upper cervical spine: Normal marrow signal. Sinuses/Orbits: Negative. Other: None. IMPRESSION: 1. No acute intracranial abnormality. 2. Small amount of nonspecific T2 hyperintense lesions of the white matter. Differential diagnosis include early chronic microangiopathy, demyelinating disease, vasculitis and other post inflammatory/infectious processes. Electronically Signed   By: Pedro Earls M.D.   On: 11/23/2020 15:38   MR Cervical Spine Wo Contrast  Result Date: 11/23/2020 CLINICAL DATA:  Left arm numbness EXAM: MRI CERVICAL SPINE WITHOUT CONTRAST TECHNIQUE: Multiplanar, multisequence MR imaging of the cervical spine was performed. No intravenous contrast was administered. COMPARISON:  X-ray 11/24/2006 FINDINGS: Technical Note: Despite efforts by the technologist and patient, motion artifact is present on today's exam and could not be eliminated. This reduces exam sensitivity and specificity. Alignment: No significant static listhesis. Vertebrae: No fracture, evidence of discitis, or bone lesion. Cord: No evidence of focal cord signal abnormality on motion degraded images. Posterior Fossa, vertebral arteries, paraspinal tissues: Please refer to dedicated concurrent MRI of the brain for evaluation of the posterior fossa structures. Vertebral artery flow voids appear preserved. No paravertebral abnormalities identified. Disc levels: C2-C3: No disc protrusion. Unremarkable facet joints. No foraminal or canal stenosis. C3-C4: Mild disc osteophyte complex with left greater than right uncovertebral spurring and bilateral facet arthropathy. Mild-to-moderate  bilateral foraminal stenosis. No canal stenosis. C4-C5: Mild disc osteophyte complex with left greater than right facet and uncovertebral arthropathy resulting in mild-to-moderate left foraminal stenosis. No canal stenosis. C5-C6: Disc osteophyte complex with bilateral facet and uncovertebral arthropathy results in mild canal stenosis with moderate bilateral foraminal stenosis. C6-C7: Mild disc osteophyte complex and minimal facet arthropathy. There may be mild bilateral foraminal stenosis. No evidence of significant canal stenosis. C7-T1: Small left paracentral disc protrusion without evidence of foraminal or canal stenosis. IMPRESSION: 1. Motion degraded examination. 2. No acute osseous abnormality or evidence of cord signal abnormality. 3. Multilevel degenerative changes of the cervical spine, most pronounced at the C5-6 level where there is mild canal stenosis and moderate bilateral foraminal stenosis. Electronically Signed   By: Davina Poke D.O.   On: 11/23/2020 15:40   VAS [place] LOWER EXTREMITY VENOUS (DVT) (7a-7p)  Result Date: 11/23/2020  Lower Venous DVT Study Patient Name:  TOMOKO SANDRA  Date of Exam:  11/23/2020 Medical Rec #: [id number]        Accession #:    [id number] Date of Birth: 03/02/1960        Patient Gender: F Patient Age:   60 years Exam Location:  [facility] Procedure:      VAS [place] LOWER EXTREMITY VENOUS (DVT) Referring Phys: Ark Agrusa --------------------------------------------------------------------------------  Indications: Pain in left calf.  Comparison Study: No prior studies. Performing Technologist: Darlin Coco RDMS, RVT  Examination Guidelines: A complete evaluation includes B-mode imaging, spectral Doppler, color Doppler, and power Doppler as needed of all accessible portions of each vessel. Bilateral testing is considered an integral part of a complete examination. Limited examinations for reoccurring indications may be performed as noted. The reflux portion of  the exam is performed with the patient in reverse Trendelenburg.  +-----+---------------+---------+-----------+----------+--------------+ RIGHTCompressibilityPhasicitySpontaneityPropertiesThrombus Aging +-----+---------------+---------+-----------+----------+--------------+ CFV  Full           Yes      Yes                                 +-----+---------------+---------+-----------+----------+--------------+   +---------+---------------+---------+-----------+----------+--------------+ LEFT     CompressibilityPhasicitySpontaneityPropertiesThrombus Aging +---------+---------------+---------+-----------+----------+--------------+ CFV      Full           Yes      Yes                                 +---------+---------------+---------+-----------+----------+--------------+ SFJ      Full                                                        +---------+---------------+---------+-----------+----------+--------------+ FV Prox  Full                                                        +---------+---------------+---------+-----------+----------+--------------+ FV Mid   Full                                                        +---------+---------------+---------+-----------+----------+--------------+ FV DistalFull                                                        +---------+---------------+---------+-----------+----------+--------------+ PFV      Full                                                        +---------+---------------+---------+-----------+----------+--------------+ POP      Full           Yes      Yes                                 +---------+---------------+---------+-----------+----------+--------------+  PTV      Full                                                        +---------+---------------+---------+-----------+----------+--------------+ PERO     Full                                                         +---------+---------------+---------+-----------+----------+--------------+     Summary: RIGHT: - No evidence of common femoral vein obstruction.  LEFT: - There is no evidence of deep vein thrombosis in the lower extremity.  - No cystic structure found in the popliteal fossa.  *See table(s) above for measurements and observations. Electronically signed by Jamelle Haring on 11/23/2020 at 2:28:31 PM.    Final     ____________________________________________   PROCEDURES  Procedure(s) performed:   Procedures  None ____________________________________________   INITIAL IMPRESSION / ASSESSMENT AND PLAN / ED COURSE  Pertinent labs & imaging results that were available during my care of the patient were reviewed by me and considered in my medical decision making (see chart for details).   Patient presents emergency department with multiple symptoms as described above.  She is having some upper respiratory type infection symptoms along with chest pressure but clinically seems more consistent with bronchitis.  X-ray shows some possible streaky infiltrate/atelectasis and feel she would benefit from course of antibiotics and repeat x-ray.  Patient having some nonspecific paresthesias in the left.  MRI does not show evidence of acute stroke or other acute process.  There is some nonspecific signal as described.  Patient has no fever or leukocytosis to suspect central infectious process.  Will refer to neurology for outpatient follow-up.   Patient's COVID is negative.  DVT ultrasound does not show DVT in the left leg. Discussed plan at time of discharge and PCP/Neuro follow up.  ____________________________________________  FINAL CLINICAL IMPRESSION(S) / ED DIAGNOSES  Final diagnoses:  Upper respiratory tract infection, unspecified type  Paresthesias  Fatigue, unspecified type     MEDICATIONS GIVEN DURING THIS VISIT:  Medications  acetaminophen (TYLENOL) tablet 650 mg (650 mg Oral Given 11/23/20  1002)  LORazepam (ATIVAN) injection 1 mg (1 mg Intravenous Given 11/23/20 1406)     NEW OUTPATIENT MEDICATIONS STARTED DURING THIS VISIT:  Discharge Medication List as of 11/23/2020  3:56 PM     START taking these medications   Details  !! albuterol (VENTOLIN HFA) 108 (90 Base) MCG/ACT inhaler Inhale 1-2 puffs into the lungs every 6 (six) hours as needed for wheezing or shortness of breath., Starting Thu 11/23/2020, Normal    doxycycline (VIBRAMYCIN) 100 MG capsule Take 1 capsule (100 mg total) by mouth 2 (two) times daily for 7 days., Starting Thu 11/23/2020, Until Thu 11/30/2020, Normal     !! - Potential duplicate medications found. Please discuss with provider.      Note:  This document was prepared using Dragon voice recognition software and may include unintentional dictation errors.  Nanda Quinton, MD, [facility]    Brevin Mcfadden, Wonda Olds, MD 11/25/20 [phone]

## 2020-11-23 NOTE — Telephone Encounter (Signed)
FYI. Please see below.   [facility] - Client TELEPHONE ADVICE RECORD [facility] Patient Name: Jamie Mercado Gender: Female DOB: 05-05-1960 Age: 60 Y 76 M 13 D Return Phone Number: [phone] (Primary) Address: City/ State/ Zip: [place]  [address] Client [facility] - Client Client Site [place] - Day Contact Type Call Who Is Calling Patient / Member / Family / Caregiver Call Type Triage / Clinical Caller Name Jamie Mercado Relationship To Patient Self Return Phone Number [phone] (Primary) Chief Complaint CHEST PAIN - pain, pressure, heaviness or tightness Reason for Call Symptomatic / Request for [name] states she has heart pain, left arm and finger tingling, dizziness for the past few weeks, Her BP is 157/107 Translation No Nurse Assessment Nurse: Raphael Gibney, RN, Vanita Ingles Date/Time (Eastern Time): 11/23/2020 9:00:41 AM Confirm and document reason for call. If symptomatic, describe symptoms. ---Caller states her BP is 157/107. she has been having chest pain around her left breast. left arm is tingling. has been dizzy. Does the patient have any new or worsening symptoms? ---Yes Will a triage be completed? ---Yes Related visit to physician within the last 2 weeks? ---No Does the PT have any chronic conditions? (i.e. diabetes, asthma, this includes High risk factors for pregnancy, etc.) ---Yes List chronic conditions. ---thyroid disease Is this a behavioral health or substance abuse call? ---No Guidelines Guideline Title Affirmed Question Affirmed Notes Nurse Date/Time Eilene Ghazi Time) Chest Pain [1] Chest pain lasts > 5 minutes AND [2] age > 80 Raphael Gibney, RN, Vera 11/23/2020 9:02:41 AM Disp. Time Eilene Ghazi Time) Disposition Final User 11/23/2020 8:59:15 AM Send to Urgent Lillie Columbia 11/23/2020 9:06:59 AM Send To RN Personal Raphael Gibney, RN,  Vera 11/23/2020 9:11:51 AM 911 Outcome Documentation Stringer, RN, Vanita Ingles PLEASE NOTE: All timestamps contained within this report are represented as [place] Standard Time. CONFIDENTIALTY NOTICE: This fax transmission is intended only for the addressee. It contains information that is legally privileged, confidential or otherwise protected from use or disclosure. If you are not the intended recipient, you are strictly prohibited from reviewing, disclosing, copying using or disseminating any of this information or taking any action in reliance on or regarding this information. If you have received this fax in error, please notify us immediately by telephone so that we can arrange for its return to [place]. Phone: [phone], Toll-Free: [phone], Fax: [phone] Page: 2 of 2 Call Id: [id number] [place]. Time (Eastern Time) Disposition Final User Reason: attempted 911 outcome documentation call and left message. 11/23/2020 9:06:38 AM Call EMS 911 Now Yes Raphael Gibney, RN, Doreatha Lew Disagree/Comply Disagree Caller Understands Yes PreDisposition Did not know what to do Care Advice Given Per Guideline CALL EMS 911 NOW: * Immediate medical attention is needed. You need to hang up and call 911 (or an ambulance). CARE ADVICE given per Chest Pain (Adult) guideline. Comments User: Dannielle Burn, RN Date/Time Eilene Ghazi Time): 11/23/2020 9:06:23 AM pt states she is not going to call 911 but will go to the ER User: Dannielle Burn, RN Date/Time Eilene Ghazi Time): 11/23/2020 9:10:53 AM pt states she has to tell her boss that she needs to go to the ER and will go then Referrals [place] UNDECIDED

## 2020-11-23 NOTE — ED Triage Notes (Signed)
Pt reports headache, sob, left arm tingling and nausea for the past 3 days. Pt also states she has been spots in both eyes intermittently. No hx of HTN but BP high.

## 2020-11-23 NOTE — ED Provider Notes (Signed)
Emergency Medicine Provider Triage Evaluation Note  Jamie Mercado , a 60 y.o. female  was evaluated in triage.  Pt complains of multiple weeks of headache, left arm tingling and hypertension.  She does not have a history of high blood pressure.  Has had on and off shortness of breath.  Review of Systems  Positive: Shortness of breath, headache, left arm tingling, dizziness Negative: Vomiting or syncope  Physical Exam  BP (!) 148/118 (BP Location: Left Arm)   Pulse 71   Temp 97.8 F (36.6 C) (Oral)   Resp 18   SpO2 100%  Gen:   Awake, no distress   Resp:  Normal effort  MSK:   Moves extremities without difficulty  Other:    Medical Decision Making  Medically screening exam initiated at 10:04 AM.  Appropriate orders placed.  Corda Shutt was informed that the remainder of the evaluation will be completed by another provider, this initial triage assessment does not replace that evaluation, and the importance of remaining in the ED until their evaluation is complete.     Darliss Ridgel 11/23/20 1009    Margette Fast, MD 11/27/20 [phone]

## 2020-11-27 ENCOUNTER — Encounter: Payer: Self-pay | Admitting: Family Medicine

## 2020-11-27 ENCOUNTER — Ambulatory Visit (INDEPENDENT_AMBULATORY_CARE_PROVIDER_SITE_OTHER): Payer: 59 | Admitting: Family Medicine

## 2020-11-27 ENCOUNTER — Other Ambulatory Visit: Payer: Self-pay

## 2020-11-27 VITALS — BP 141/87 | HR 69 | Temp 97.7°F | Ht 66.0 in | Wt 192.6 lb

## 2020-11-27 DIAGNOSIS — R200 Anesthesia of skin: Secondary | ICD-10-CM

## 2020-11-27 DIAGNOSIS — F4322 Adjustment disorder with anxiety: Secondary | ICD-10-CM

## 2020-11-27 DIAGNOSIS — F411 Generalized anxiety disorder: Secondary | ICD-10-CM

## 2020-11-27 DIAGNOSIS — R9389 Abnormal findings on diagnostic imaging of other specified body structures: Secondary | ICD-10-CM | POA: Diagnosis not present

## 2020-11-27 DIAGNOSIS — I10 Essential (primary) hypertension: Secondary | ICD-10-CM | POA: Diagnosis not present

## 2020-11-27 DIAGNOSIS — F410 Panic disorder [episodic paroxysmal anxiety] without agoraphobia: Secondary | ICD-10-CM

## 2020-11-27 DIAGNOSIS — R9089 Other abnormal findings on diagnostic imaging of central nervous system: Secondary | ICD-10-CM | POA: Diagnosis not present

## 2020-11-27 MED ORDER — AMLODIPINE BESYLATE 5 MG PO TABS: 5.0000 mg | ORAL_TABLET | Freq: Every day | ORAL | 0 refills | Status: DC

## 2020-11-27 MED ORDER — LORAZEPAM 0.5 MG PO TABS: 0.5000 mg | ORAL_TABLET | Freq: Three times a day (TID) | ORAL | 0 refills | Status: DC | PRN

## 2020-11-27 NOTE — Progress Notes (Signed)
OFFICE VISIT  11/27/2020  CC:  Chief Complaint  Patient presents with   ED follow up    HPI:    Patient is a 60 y.o. female who presents for f/u ED visit. Presented to [facility] ED 11/23/20 (4 days ago) for HA, chest tightness with URI sx's and cough, intermittent L side numbness, and fatigue.  Some recent intermittent L leg swelling. Present x 3 wks, worse last 3d.   Labs showed cbc, cmet, TSH, lipase, and UA all normal.  Troponins neg and flat.  Flu A/B and covid testing all negative. LE doppler neg for DVT.  EKG normal.  CXR showed: IMPRESSION: Subtle subcentimeter hazy nodular airspace opacity in the peripheral aspect of right lung may be infectious versus inflammatory in etiology. The lungs are otherwise clear. Recommend repeat radiograph in 6-8 weeks to ensure resolution given patient's history of smoking.  MR brain w/out contrast. IMPRESSION: 1. No acute intracranial abnormality. 2. Small amount of nonspecific T2 hyperintense lesions of the white matter. Differential diagnosis include early chronic microangiopathy, demyelinating disease, vasculitis and other post inflammatory/infectious processes.  MR C spine w/out contrast:  IMPRESSION: 1. Motion degraded examination. 2. No acute osseous abnormality or evidence of cord signal abnormality. 3. Multilevel degenerative changes of the cervical spine, most pronounced at the C5-6 level where there is mild canal stenosis and moderate bilateral foraminal stenosis.  Dx URI with bronchitis.  Doxy x 7d rx'd.  Outpt neuro eval recommended based on L sided numbness complaints and abnormal signal on MRI brain.  INTERIM HX: Chest tightness is gone.  Never really had a cough or wheeze or SOB.  No CP. Allergy/URI sx's were present but much better now.   Facial flush and HA, dizzy, feels heart beat fast.  Hard to tell how long these sx's last. Severity of sx's fluctuates, sometimes a day or two of feeling ok then day or two of sx's. 150/118  with home cuff and this is typical for her lately.   All these sx's last 2-3 mo. Lots of stress, lives with aunt who has a stroke, daughter recently dx'd with MS. Seems like she has quite high anxiety/panic during these times  Denies depression.  Works at [employer] and likes it.  No signif alcohol use. Still smoking but down to <1/2 pack cigs per day.  Still having mild intermittent L lower leg and L forearm and wrist/hand. No focal weakness or extremity pain.  No neck or low back pain.  PMP AWARE reviewed today: no rx's in the last year. No red flags.  ROS as above, plus--> no fevers,  no rashes, no melena/hematochezia.  No polyuria or polydipsia.  No myalgias or arthralgias.  No tremors.  No acute vision or hearing abnormalities.  No dysuria or unusual/new urinary urgency or frequency.  No recent changes in lower legs. No n/v/d or abd pain.    Past Medical History:  Diagnosis Date   Acute gastritis without bleeding Fall 2019   H pylori NEG on EGD   Cervical cancer ([place])    around age 64   Colon polyps    COVID-19 virus infection 07/2020   paxlovid   GAD (generalized anxiety disorder)    NO BENZOS DUE TO PT FAILING UDS X 2 IN 2019.   GERD (gastroesophageal reflux disease)    well controlled with ranitidine bid   Hay fever    Hypothyroidism    IBS (irritable bowel syndrome)    Insomnia    Kidney stones  Marijuana use 2019   Failed UDS x 2 2019; NO FURTHER CONTROLLED SUBSTANCES ARE TO BE PRESCRIBED FOR THIS PATIENT.   Moderate persistent asthma    Not taking controller meds b/c she said she improved significantly with cutting back on tobacco use.   Psoriasis    Tobacco dependence    ongoing as of 02/2017    Past Surgical History:  Procedure Laterality Date   APPENDECTOMY     COLONOSCOPY  2015   endometrosis     TAH/BSO 2011   ESOPHAGOGASTRODUODENOSCOPY  12/05/2017   lower third of esoph inflamed, +gastritis (H pylori NEG).  Duodenal bx Normal.   KNEE SURGERY      TONSILLECTOMY     TOTAL ABDOMINAL HYSTERECTOMY W/ BILATERAL SALPINGOOPHORECTOMY  2011   Endometriosis   TUBAL LIGATION      Outpatient Medications Prior to Visit  Medication Sig Dispense Refill   acetaminophen (TYLENOL) 500 MG tablet Take 1,000 mg by mouth every 8 (eight) hours as needed for mild pain, moderate pain or headache.     albuterol (VENTOLIN HFA) 108 (90 Base) MCG/ACT inhaler Inhale 1-2 puffs into the lungs every 6 (six) hours as needed for wheezing or shortness of breath. 6.7 g 0   levothyroxine (SYNTHROID) 100 MCG tablet 1 tab po qd x 6d per week and 1.5 tabs one day a week 96 tablet 0   benzonatate (TESSALON) 100 MG capsule Take 1 capsule (100 mg total) by mouth every 8 (eight) hours. 21 capsule 0   doxycycline (VIBRAMYCIN) 100 MG capsule Take 1 capsule (100 mg total) by mouth 2 (two) times daily for 7 days. 14 capsule 0   predniSONE (DELTASONE) 20 MG tablet Take 1 tablet (20 mg total) by mouth daily for 5 days. 5 tablet 0   ipratropium-albuterol (DUONEB) 0.5-2.5 (3) MG/3ML SOLN Take 3 mLs by nebulization every 6 (six) hours as needed. (Patient not taking: Reported on 11/27/2020) 75 mL 0   albuterol (PROVENTIL HFA;VENTOLIN HFA) 108 (90 Base) MCG/ACT inhaler Inhale 1-2 puffs into the lungs every 6 (six) hours as needed for wheezing. (Patient not taking: Reported on 11/27/2020) 1 Inhaler 0   FLUoxetine (PROZAC) 20 MG capsule Take 1 capsule (20 mg total) by mouth daily. (Patient not taking: Reported on 11/27/2020) 30 capsule 0   ondansetron (ZOFRAN ODT) 8 MG disintegrating tablet Take 1 tablet (8 mg total) by mouth every 8 (eight) hours as needed for nausea or vomiting. (Patient not taking: Reported on 11/27/2020) 20 tablet 0   oxyCODONE-acetaminophen (PERCOCET/ROXICET) 5-325 MG tablet Take 2 tablets by mouth every 4 (four) hours as needed for severe pain. (Patient not taking: Reported on 11/27/2020) 15 tablet 0   pantoprazole (PROTONIX) 40 MG tablet Take 1 tablet (40 mg total) by mouth  daily. (Patient not taking: Reported on 11/27/2020) 90 tablet 5   traZODone (DESYREL) 50 MG tablet Take 1-2 tablets (50-100 mg total) by mouth at bedtime as needed for sleep. (Patient not taking: Reported on 11/27/2020) 60 tablet 0   triamcinolone (KENALOG) 0.025 % ointment Apply 1 application topically 2 (two) times daily. (Patient not taking: Reported on 11/27/2020) 454 g 0   Zinc Sulfate 220 (50 Zn) MG TABS Take 1 tablet (220 mg total) by mouth daily. (Patient not taking: No sig reported) 7 tablet 0   No facility-administered medications prior to visit.    Allergies  Allergen Reactions   Codeine Itching, Nausea And Vomiting and Other (See Comments)    Makes her feel wierd  ROS As per HPI  PE: Vitals with BMI 11/27/2020 11/23/2020 11/23/2020  Height 5\' 6"  - -  Weight 192 lbs 10 oz - -  BMI 19.6 - -  Systolic 222 979 892  Diastolic 87 69 76  Pulse 69 68 69    Gen: Alert, well appearing.  Patient is oriented to person, place, time, and situation. AFFECT: pleasant, lucid thought and speech. No facial flushing. CV: RRR, no m/r/g.   LUNGS: CTA bilat, nonlabored resps, good aeration in all lung fields. EXT: no clubbing or cyanosis.  no edema.  Neuro: CN 2-12 intact bilaterally, strength 5/5 in proximal and distal upper extremities and lower extremities bilaterally.  No tremor.  No disdiadochokinesis.  No ataxia.  Upper extremity and lower extremity DTRs symmetric.  No pronator drift.  LABS:  Lab Results  Component Value Date   WBC 6.5 11/23/2020   HGB 15.2 (H) 11/23/2020   HCT 46.2 (H) 11/23/2020   MCV 85.1 11/23/2020   PLT 269 11/23/2020     Chemistry      Component Value Date/Time   NA 139 11/23/2020 1040   K 4.7 11/23/2020 1040   CL 101 11/23/2020 1040   CO2 30 11/23/2020 1040   BUN 10 11/23/2020 1040   CREATININE 0.75 11/23/2020 1040   CREATININE 0.84 05/05/2018 1615      Component Value Date/Time   CALCIUM 10.0 11/23/2020 1040   ALKPHOS 63 11/23/2020 1110    AST 19 11/23/2020 1110   ALT 23 11/23/2020 1110   BILITOT 0.5 11/23/2020 1110     Troponin (Point of Care Test)  IMPRESSION AND PLAN:  1) HTN, bp's have been up in remote past before but never has been on antihypertensive. High stress and tobacco abuse contributing.  Recent lytes/cr, UA, EKG, and TSH normal. Start amlodipine 5mg  qd and I expect we'll increase at f/u in 10-14 d.  2) Intermittent chest tightness, flushing, HAs, feeling heart rate increased-->need to treat as if panic attacks at this point in time. Much less common cause is pheochromocytoma but will not do testing unless sx's continue to occur when bp and anxiety better controlled.  3) Anxiety: generalized, with superimposed adjustment d/o with anxiety/panic. Start lorazepam 0.5 mg 3 times daily as needed In near future we will get her started on SSRI.  Of note fluoxetine made her feel "weird" in the past.  she has not been on this medicine in greater than a year. Need to start controlled substance contract and urine drug screening as appropriate when I see her for follow-up.  4.:  Abnormal MRI brain and C-spine.  Intermittent left arm and leg numbness.  Referral to neurology ordered today.  #5:  Abnormal chest x-ray: Tiny right lung nodular opacity.  This was treated with steroids and antibiotics by the emergency department recently.  We will need to arrange follow-up chest x-ray to see see if this cleared in 6 to 8 weeks (I told pt today we'll do f/u cxr at the time of next f/u in 10-14d but I'll clear this up at that visit).  An After Visit Summary was printed and given to the patient.  FOLLOW UP: Return for 10-14d follow up HTN and anxiety.  Signed:  Crissie Sickles, MD           11/27/2020

## 2020-12-01 ENCOUNTER — Ambulatory Visit: Payer: 59 | Admitting: Family Medicine

## 2020-12-13 ENCOUNTER — Other Ambulatory Visit: Payer: Self-pay

## 2020-12-13 ENCOUNTER — Ambulatory Visit: Payer: 59 | Admitting: Family Medicine

## 2020-12-13 MED ORDER — AMLODIPINE BESYLATE 5 MG PO TABS: 5.0000 mg | ORAL_TABLET | Freq: Every day | ORAL | 0 refills | Status: DC

## 2020-12-13 MED ORDER — LORAZEPAM 0.5 MG PO TABS: 0.5000 mg | ORAL_TABLET | Freq: Three times a day (TID) | ORAL | 0 refills | Status: DC | PRN

## 2020-12-13 MED ORDER — LEVOTHYROXINE SODIUM 100 MCG PO TABS: ORAL_TABLET | ORAL | 0 refills | Status: AC

## 2020-12-13 NOTE — Telephone Encounter (Signed)
Pt called stating that she is in need of medication and had to reschedule her appt. She will be calling when she gets her work schedule to reschedule her appt. Pt states that the new blood pressure medication is working for her. She was last seen in office 11/27/20  amLODipine (NORVASC) 5 MG tablet levothyroxine (SYNTHROID) 100 MCG tablet LORazepam (ATIVAN) 0.5 MG tablet  Please advise

## 2020-12-13 NOTE — Telephone Encounter (Signed)
LVM for pt to call and schedule appt

## 2020-12-19 DIAGNOSIS — E7800 Pure hypercholesterolemia, unspecified: Secondary | ICD-10-CM

## 2020-12-19 HISTORY — DX: Pure hypercholesterolemia, unspecified: E78.00

## 2020-12-22 ENCOUNTER — Ambulatory Visit: Payer: 59 | Admitting: Family Medicine

## 2020-12-22 NOTE — Progress Notes (Deleted)
OFFICE VISIT  12/22/2020  CC: No chief complaint on file.   HPI:    Patient is a 60 y.o. female who presents for 3 week f/u HTN, episodic panic/flushing, anxiety, and recent hx of abnormal CXR. A/P as of last visit: "1) HTN, bp's have been up in remote past before but never has been on antihypertensive. High stress and tobacco abuse contributing.  Recent lytes/cr, UA, EKG, and TSH normal. Start amlodipine 5mg  qd and I expect we'll increase at f/u in 10-14 d.   2) Intermittent chest tightness, flushing, HAs, feeling heart rate increased-->need to treat as if panic attacks at this point in time. Much less common cause is pheochromocytoma but will not do testing unless sx's continue to occur when bp and anxiety better controlled.   3) Anxiety: generalized, with superimposed adjustment d/o with anxiety/panic. Start lorazepam 0.5 mg 3 times daily as needed In near future we will get her started on SSRI.  Of note fluoxetine made her feel "weird" in the past.  she has not been on this medicine in greater than a year. Need to start controlled substance contract and urine drug screening as appropriate when I see her for follow-up.  4.:  Abnormal MRI brain and C-spine.  Intermittent left arm and leg numbness.  Referral to neurology ordered today.  #5:  Abnormal chest x-ray: Tiny right lung nodular opacity.  This was treated with steroids and antibiotics by the emergency department recently.  We will need to arrange follow-up chest x-ray to see see if this cleared in 6 to 8 weeks (I told pt today we'll do f/u cxr at the time of next f/u in 10-14d but I'll clear this up at that visit)."  INTERIM HX: ***   PMP AWARE reviewed today: most recent lorazepam rx filled 12/14/20, # 41, rx by me. Prior to that she filled rx for loraz, #40, rx by me. No red flags.  Past Medical History:  Diagnosis Date   Acute gastritis without bleeding Fall 2019   H pylori NEG on EGD   Cervical cancer ([place])     around age 47   Colon polyps    COVID-19 virus infection 07/2020   paxlovid   Essential hypertension    GAD (generalized anxiety disorder)    NO BENZOS DUE TO PT FAILING UDS X 2 IN 2019.   GERD (gastroesophageal reflux disease)    well controlled with ranitidine bid   Hay fever    Hypothyroidism    IBS (irritable bowel syndrome)    Insomnia    Kidney stones    Marijuana use 2019   Failed UDS x 2 2019; NO FURTHER CONTROLLED SUBSTANCES ARE TO BE PRESCRIBED FOR THIS PATIENT.   Moderate persistent asthma    Not taking controller meds b/c she said she improved significantly with cutting back on tobacco use.   Psoriasis    Tobacco dependence    ongoing as of 02/2017    Past Surgical History:  Procedure Laterality Date   APPENDECTOMY     COLONOSCOPY  2015   endometrosis     TAH/BSO 2011   ESOPHAGOGASTRODUODENOSCOPY  12/05/2017   lower third of esoph inflamed, +gastritis (H pylori NEG).  Duodenal bx Normal.   KNEE SURGERY     TONSILLECTOMY     TOTAL ABDOMINAL HYSTERECTOMY W/ BILATERAL SALPINGOOPHORECTOMY  2011   Endometriosis   TUBAL LIGATION      Outpatient Medications Prior to Visit  Medication Sig Dispense Refill   acetaminophen (TYLENOL)  500 MG tablet Take 1,000 mg by mouth every 8 (eight) hours as needed for mild pain, moderate pain or headache.     albuterol (VENTOLIN HFA) 108 (90 Base) MCG/ACT inhaler Inhale 1-2 puffs into the lungs every 6 (six) hours as needed for wheezing or shortness of breath. 6.7 g 0   amLODipine (NORVASC) 5 MG tablet Take 1 tablet (5 mg total) by mouth daily. 30 tablet 0   ipratropium-albuterol (DUONEB) 0.5-2.5 (3) MG/3ML SOLN Take 3 mLs by nebulization every 6 (six) hours as needed. (Patient not taking: Reported on 11/27/2020) 75 mL 0   levothyroxine (SYNTHROID) 100 MCG tablet 1 tab po qd x 6d per week and 1.5 tabs one day a week 96 tablet 0   LORazepam (ATIVAN) 0.5 MG tablet Take 1 tablet (0.5 mg total) by mouth every 8 (eight) hours as needed for  anxiety. 40 tablet 0   No facility-administered medications prior to visit.    Allergies  Allergen Reactions   Codeine Itching, Nausea And Vomiting and Other (See Comments)    Makes her feel wierd    ROS As per HPI  PE: Vitals with BMI 11/27/2020 11/23/2020 11/23/2020  Height 5\' 6"  - -  Weight 192 lbs 10 oz - -  BMI 45.8 - -  Systolic 099 833 825  Diastolic 87 69 76  Pulse 69 68 69     ***  LABS:    Chemistry      Component Value Date/Time   NA 139 11/23/2020 1040   K 4.7 11/23/2020 1040   CL 101 11/23/2020 1040   CO2 30 11/23/2020 1040   BUN 10 11/23/2020 1040   CREATININE 0.75 11/23/2020 1040   CREATININE 0.84 05/05/2018 1615      Component Value Date/Time   CALCIUM 10.0 11/23/2020 1040   ALKPHOS 63 11/23/2020 1110   AST 19 11/23/2020 1110   ALT 23 11/23/2020 1110   BILITOT 0.5 11/23/2020 1110     Lab Results  Component Value Date   WBC 6.5 11/23/2020   HGB 15.2 (H) 11/23/2020   HCT 46.2 (H) 11/23/2020   MCV 85.1 11/23/2020   PLT 269 11/23/2020   Lab Results  Component Value Date   TSH 1.649 11/23/2020   IMPRESSION AND PLAN:  No problem-specific Assessment & Plan notes found for this encounter.   An After Visit Summary was printed and given to the patient.  FOLLOW UP: No follow-ups on file.  Signed:  Crissie Sickles, MD           12/22/2020

## 2020-12-29 ENCOUNTER — Other Ambulatory Visit: Payer: Self-pay

## 2020-12-29 ENCOUNTER — Encounter: Payer: Self-pay | Admitting: Family Medicine

## 2020-12-29 ENCOUNTER — Ambulatory Visit (INDEPENDENT_AMBULATORY_CARE_PROVIDER_SITE_OTHER): Payer: 59 | Admitting: Family Medicine

## 2020-12-29 VITALS — BP 110/76 | HR 70 | Temp 98.0°F | Ht 66.0 in | Wt 194.2 lb

## 2020-12-29 DIAGNOSIS — I10 Essential (primary) hypertension: Secondary | ICD-10-CM

## 2020-12-29 DIAGNOSIS — F5104 Psychophysiologic insomnia: Secondary | ICD-10-CM | POA: Diagnosis not present

## 2020-12-29 DIAGNOSIS — R9389 Abnormal findings on diagnostic imaging of other specified body structures: Secondary | ICD-10-CM | POA: Diagnosis not present

## 2020-12-29 DIAGNOSIS — F411 Generalized anxiety disorder: Secondary | ICD-10-CM

## 2020-12-29 LAB — BASIC METABOLIC PANEL
BUN: 12 mg/dL (ref 6–23)
CO2: 30 mEq/L (ref 19–32)
Calcium: 9.7 mg/dL (ref 8.4–10.5)
Chloride: 104 mEq/L (ref 96–112)
Creatinine, Ser: 0.7 mg/dL (ref 0.40–1.20)
GFR: 94.8 mL/min (ref >60.00)
Glucose, Bld: 104 mg/dL — ABNORMAL HIGH (ref 70–99)
Potassium: 4.3 mEq/L (ref 3.5–5.1)
Sodium: 141 mEq/L (ref 135–145)

## 2020-12-29 LAB — LIPID PANEL
Cholesterol: 247 mg/dL — ABNORMAL HIGH (ref 0–200)
HDL: 60.9 mg/dL (ref >39.00)
LDL Cholesterol: 156 mg/dL — ABNORMAL HIGH (ref 0–99)
NonHDL: 186.05
Total CHOL/HDL Ratio: 4
Triglycerides: 149 mg/dL (ref 0.0–149.0)
VLDL: 29.8 mg/dL (ref 0.0–40.0)

## 2020-12-29 MED ORDER — AMLODIPINE BESYLATE 5 MG PO TABS: 5.0000 mg | ORAL_TABLET | Freq: Every day | ORAL | 3 refills | Status: AC

## 2020-12-29 MED ORDER — LORAZEPAM 0.5 MG PO TABS: 0.5000 mg | ORAL_TABLET | Freq: Three times a day (TID) | ORAL | 5 refills | Status: AC | PRN

## 2020-12-29 MED ORDER — MIRTAZAPINE 7.5 MG PO TABS: 7.5000 mg | ORAL_TABLET | Freq: Every day | ORAL | 1 refills | Status: AC

## 2020-12-29 NOTE — Progress Notes (Signed)
OFFICE VISIT  12/29/2020  CC:  Chief Complaint  Patient presents with   Hypertension    Follow up; pt is fasting    HPI:    Patient is a 60 y.o. female who presents for 1 mo f/u HTN and anxiety. A/P as of last visit: "1) HTN, bp's have been up in remote past before but never has been on antihypertensive. High stress and tobacco abuse contributing.  Recent lytes/cr, UA, EKG, and TSH normal. Start amlodipine 5mg  qd and I expect we'll increase at f/u in 10-14 d.   2) Intermittent chest tightness, flushing, HAs, feeling heart rate increased-->need to treat as if panic attacks at this point in time. Much less common cause is pheochromocytoma but will not do testing unless sx's continue to occur when bp and anxiety better controlled.   3) Anxiety: generalized, with superimposed adjustment d/o with anxiety/panic. Start lorazepam 0.5 mg 3 times daily as needed In near future we will get her started on SSRI.  Of note fluoxetine made her feel "weird" in the past.  she has not been on this medicine in greater than a year. Need to start controlled substance contract and urine drug screening as appropriate when I see her for follow-up.  4.:  Abnormal MRI brain and C-spine.  Intermittent left arm and leg numbness.  Referral to neurology ordered today.  #5:  Abnormal chest x-ray: Tiny right lung nodular opacity.  This was treated with steroids and antibiotics by the emergency department recently.  We will need to arrange follow-up chest x-ray to see see if this cleared in 6 to 8 weeks (I told pt today we'll do f/u cxr at the time of next f/u in 10-14d but I'll clear this up at that visit)."  INTERIM HX: Feeling much better.  Anx:  Lorazepam->taking prn, usually 2 x/day and it helps some. About 6 cigs/day now.   Home bp monitoring: none.  Taking amlodipine 5 qd.  Still some intermittent flushing/flustered feeling MUCH less over time. Trouble maintaining sleep, wakes up q 2h and mind won't shut  down.  Trazodone no help.  Lorazepam no help. No depressed mood.  PMP AWARE reviewed today: most recent rx for lorazepam 0.5 was filled 12/14/20, # 25, rx by me. No red flags.  ROS as above, plus--> no fevers, no CP, no SOB, no wheezing, no cough, no dizziness, no HAs, no rashes, no melena/hematochezia.  No polyuria or polydipsia.  No myalgias or arthralgias.  No focal weakness, paresthesias, or tremors.  No acute vision or hearing abnormalities.  No dysuria or unusual/new urinary urgency or frequency.  No recent changes in lower legs. No n/v/d or abd pain.  No palpitations.    Past Medical History:  Diagnosis Date   Acute gastritis without bleeding Fall 2019   H pylori NEG on EGD   Cervical cancer ([place])    around age 60   Colon polyps    COVID-19 virus infection 07/2020   paxlovid   Essential hypertension    GAD (generalized anxiety disorder)    GERD (gastroesophageal reflux disease)    well controlled with ranitidine bid   Hay fever    Hypothyroidism    IBS (irritable bowel syndrome)    Insomnia    Kidney stones    Moderate persistent asthma    Not taking controller meds b/c she said she improved significantly with cutting back on tobacco use.   Psoriasis    Tobacco dependence    ongoing as of 02/2017  Past Surgical History:  Procedure Laterality Date   APPENDECTOMY     COLONOSCOPY  2015   endometrosis     TAH/BSO 2011   ESOPHAGOGASTRODUODENOSCOPY  12/05/2017   lower third of esoph inflamed, +gastritis (H pylori NEG).  Duodenal bx Normal.   KNEE SURGERY     TONSILLECTOMY     TOTAL ABDOMINAL HYSTERECTOMY W/ BILATERAL SALPINGOOPHORECTOMY  2011   Endometriosis   TUBAL LIGATION      Outpatient Medications Prior to Visit  Medication Sig Dispense Refill   acetaminophen (TYLENOL) 500 MG tablet Take 1,000 mg by mouth every 8 (eight) hours as needed for mild pain, moderate pain or headache.     albuterol (VENTOLIN HFA) 108 (90 Base) MCG/ACT inhaler Inhale 1-2 puffs into  the lungs every 6 (six) hours as needed for wheezing or shortness of breath. 6.7 g 0   levothyroxine (SYNTHROID) 100 MCG tablet 1 tab po qd x 6d per week and 1.5 tabs one day a week 96 tablet 0   amLODipine (NORVASC) 5 MG tablet Take 1 tablet (5 mg total) by mouth daily. 30 tablet 0   LORazepam (ATIVAN) 0.5 MG tablet Take 1 tablet (0.5 mg total) by mouth every 8 (eight) hours as needed for anxiety. 40 tablet 0   ipratropium-albuterol (DUONEB) 0.5-2.5 (3) MG/3ML SOLN Take 3 mLs by nebulization every 6 (six) hours as needed. (Patient not taking: No sig reported) 75 mL 0   No facility-administered medications prior to visit.    Allergies  Allergen Reactions   Codeine Itching, Nausea And Vomiting and Other (See Comments)    Makes her feel wierd    ROS As per HPI  PE: Vitals with BMI 12/29/2020 11/27/2020 11/23/2020  Height 5\' 6"  5\' 6"  -  Weight 194 lbs 3 oz 192 lbs 10 oz -  BMI 59.56 38.7 -  Systolic 564 332 951  Diastolic 76 87 69  Pulse 70 69 68    Gen: Alert, well appearing.  Patient is oriented to person, place, time, and situation. AFFECT: pleasant, lucid thought and speech. CV: RRR, no m/r/g.   LUNGS: CTA bilat, nonlabored resps, good aeration in all lung fields. EXT: no clubbing or cyanosis.  no edema.    LABS:    Chemistry      Component Value Date/Time   NA 139 11/23/2020 1040   K 4.7 11/23/2020 1040   CL 101 11/23/2020 1040   CO2 30 11/23/2020 1040   BUN 10 11/23/2020 1040   CREATININE 0.75 11/23/2020 1040   CREATININE 0.84 05/05/2018 1615      Component Value Date/Time   CALCIUM 10.0 11/23/2020 1040   ALKPHOS 63 11/23/2020 1110   AST 19 11/23/2020 1110   ALT 23 11/23/2020 1110   BILITOT 0.5 11/23/2020 1110     Lab Results  Component Value Date   WBC 6.5 11/23/2020   HGB 15.2 (H) 11/23/2020   HCT 46.2 (H) 11/23/2020   MCV 85.1 11/23/2020   PLT 269 11/23/2020   Lab Results  Component Value Date   TSH 1.649 11/23/2020   Lab Results  Component  Value Date   CHOL 242 (H) 05/26/2015   HDL 74 05/26/2015   LDLCALC 140 (H) 05/26/2015   TRIG 142 05/26/2015   CHOLHDL 3.3 05/26/2015    IMPRESSION AND PLAN:  #1 hypertension: Well-controlled on amlodipine 5 mg/day. Electrolytes and creatinine checked today. Lipid screening today.  2. generalized anxiety disorder: She seems to be doing well with as needed use of  lorazepam 0.5 mg, typically taking it about 2 times per day. Controlled substance contract today.  UDS today.  She last took lorazepam last night.  #3.  Insomnia (maintenance).  Failed trazodone and lorazepam.  Will try mirtazapine 7.5 mg nightly.  #4 abnormal chest x-ray on 11/23/2020.  Tiny right lung nodular opacity.  This was treated with steroids and antibiotics by the emergency department.  Ordered f/u cxr for pt to get in about 2 wks.  #5 abnormal brain MRI 11/23/20-->per radiologist "Small amount of nonspecific T2 hyperintense lesions of the white matter. Differential diagnosis include early chronic microangiopathy, demyelinating disease, vasculitis and other post inflammatory/infectious processes". Intermittent left arm and leg numbness.  Referral to neurology last visit, pt still working on scheduling.  An After Visit Summary was printed and given to the patient.  FOLLOW UP: Return in about 4 weeks (around 01/26/2021) for f/u insomnia.  Signed:  Crissie Sickles, MD           12/29/2020

## 2021-01-01 ENCOUNTER — Encounter: Payer: Self-pay | Admitting: Family Medicine

## 2021-01-01 LAB — DM TEMPLATE

## 2021-01-01 LAB — DRUG MONITORING PANEL 376104, URINE
Alphahydroxyalprazolam: NEGATIVE ng/mL (ref <25)
Alphahydroxymidazolam: NEGATIVE ng/mL (ref <50)
Alphahydroxytriazolam: NEGATIVE ng/mL (ref <50)
Aminoclonazepam: NEGATIVE ng/mL (ref <25)
Amphetamines: NEGATIVE ng/mL (ref <500)
Barbiturates: NEGATIVE ng/mL (ref <300)
Benzodiazepines: POSITIVE ng/mL — AB (ref <100)
Cocaine Metabolite: NEGATIVE ng/mL (ref <150)
Desmethyltramadol: NEGATIVE ng/mL (ref <100)
Hydroxyethylflurazepam: NEGATIVE ng/mL (ref <50)
Lorazepam: 681 ng/mL — ABNORMAL HIGH (ref <50)
Nordiazepam: NEGATIVE ng/mL (ref <50)
Opiates: NEGATIVE ng/mL (ref <100)
Oxazepam: NEGATIVE ng/mL (ref <50)
Oxycodone: NEGATIVE ng/mL (ref <100)
Temazepam: NEGATIVE ng/mL (ref <50)
Tramadol: NEGATIVE ng/mL (ref <100)

## 2021-01-15 ENCOUNTER — Telehealth: Payer: Self-pay | Admitting: General Surgery

## 2021-01-15 NOTE — Telephone Encounter (Signed)
-----   Message from [place], DO sent at 01/09/2021  5:40 PM EST ----- Patient was seen by me in 11/2017 with EGD completed later that month.  However, I have not completed a colonoscopy for this patient.  When I first saw her in 2019 she reported having a colonoscopy at an outside facility approximately 5-6 years prior and notable for 5 polyps.  Plan at that time was for review of her prior records.  On review of notes, looks like she was unable to produce a copy of that report for our review and has not been to follow-up since then.  Happy to get the patient back in the office to discuss setting up for repeat colonoscopy for ongoing surveillance since we do not know her prior records.  Alternatively, it is very reasonable to schedule for direct access colonoscopy for ongoing surveillance at this time.  Thank you.  ----- Message ----- From: Kavin Leech, [place] Sent: 01/09/2021   2:30 PM EST To: Lavena Bullion, DO

## 2021-01-15 NOTE — Telephone Encounter (Signed)
Left a voicemail for the patient to contact the office. She needs a colonoscopy either an OV or direct  depending on screening questions.

## 2021-01-24 ENCOUNTER — Other Ambulatory Visit: Payer: Self-pay | Admitting: Family Medicine

## 2021-01-24 DIAGNOSIS — Z1231 Encounter for screening mammogram for malignant neoplasm of breast: Secondary | ICD-10-CM

## 2021-01-25 ENCOUNTER — Other Ambulatory Visit: Payer: Self-pay

## 2021-01-25 ENCOUNTER — Ambulatory Visit (INDEPENDENT_AMBULATORY_CARE_PROVIDER_SITE_OTHER): Payer: 59

## 2021-01-25 DIAGNOSIS — R9389 Abnormal findings on diagnostic imaging of other specified body structures: Secondary | ICD-10-CM

## 2021-01-25 DIAGNOSIS — Z1231 Encounter for screening mammogram for malignant neoplasm of breast: Secondary | ICD-10-CM

## 2021-01-25 IMAGING — MG MM DIGITAL SCREENING BILAT W/ TOMO AND CAD
8 series · 8 of 24 positions shown · non-contrast
Comparison: Previous exam(s).

CLINICAL DATA: Screening.

EXAM:
DIGITAL SCREENING BILATERAL MAMMOGRAM WITH TOMOSYNTHESIS AND CAD
TECHNIQUE: Bilateral screening digital craniocaudal and mediolateral oblique
mammograms were obtained. Bilateral screening digital breast
tomosynthesis was performed. The images were evaluated with
computer-aided detection.

[R CC synth-2D]
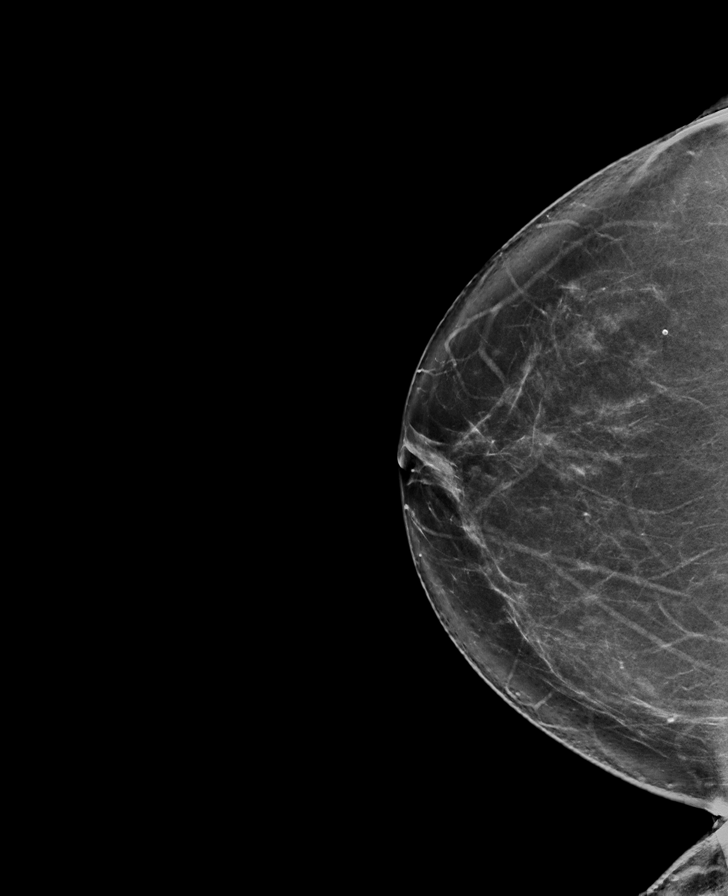

[L MLO synth-2D]
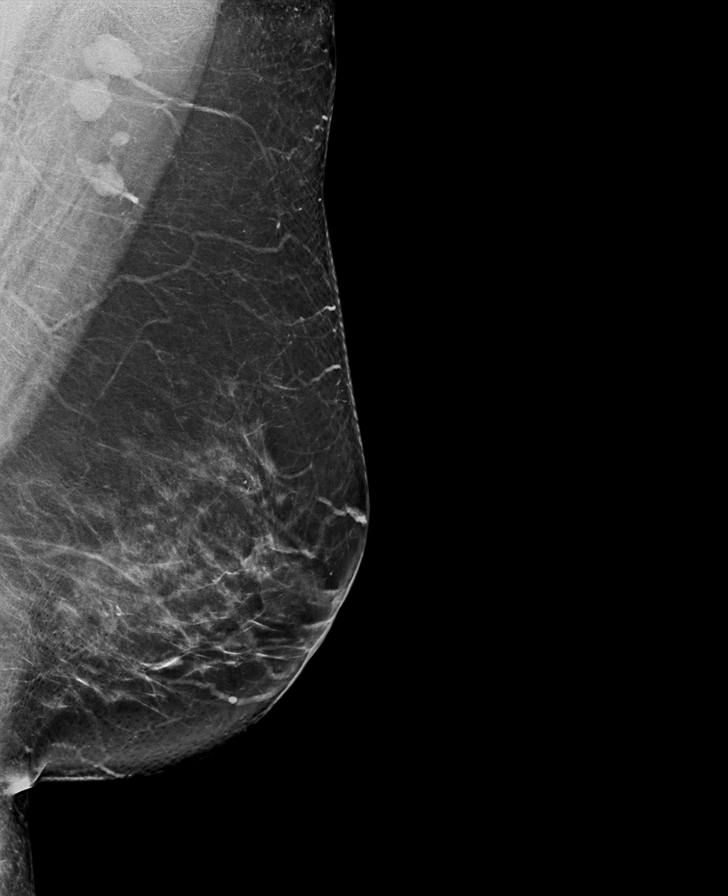

[R MLO synth-2D]
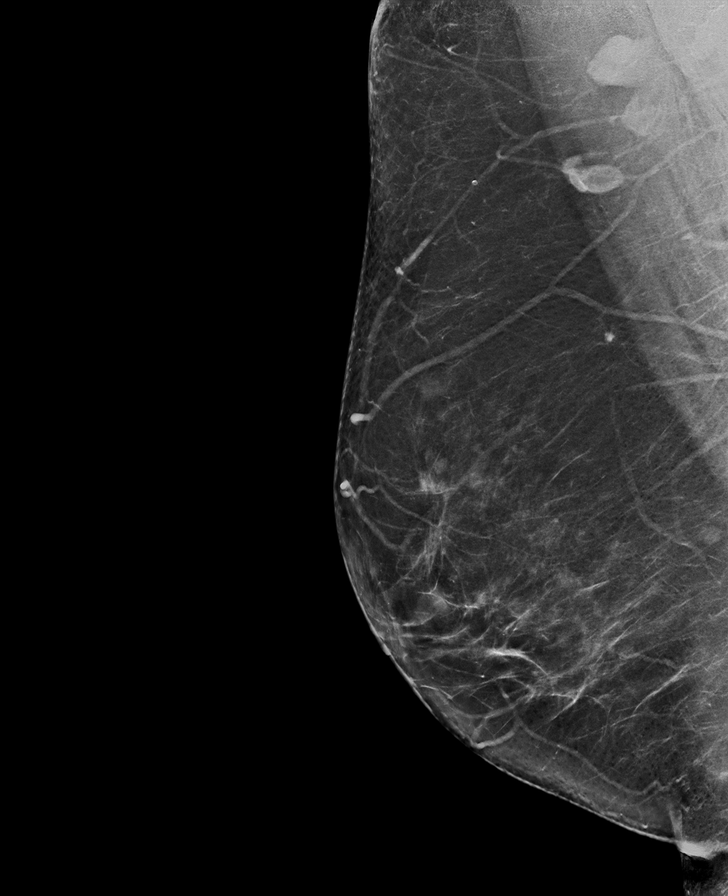

[L CC synth-2D]
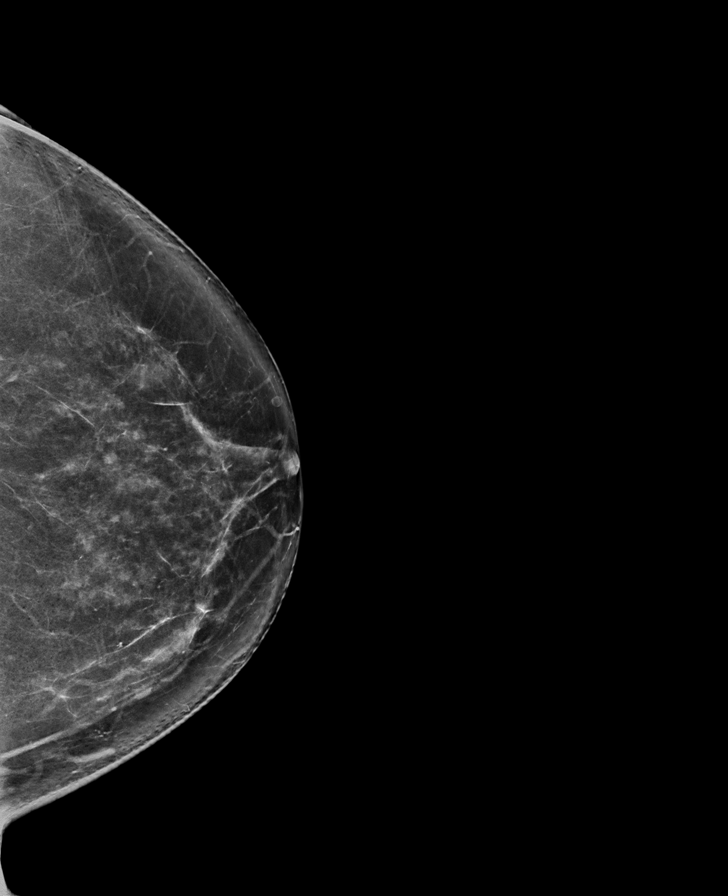

[R CC tomo · tomo slice 46/91.0]
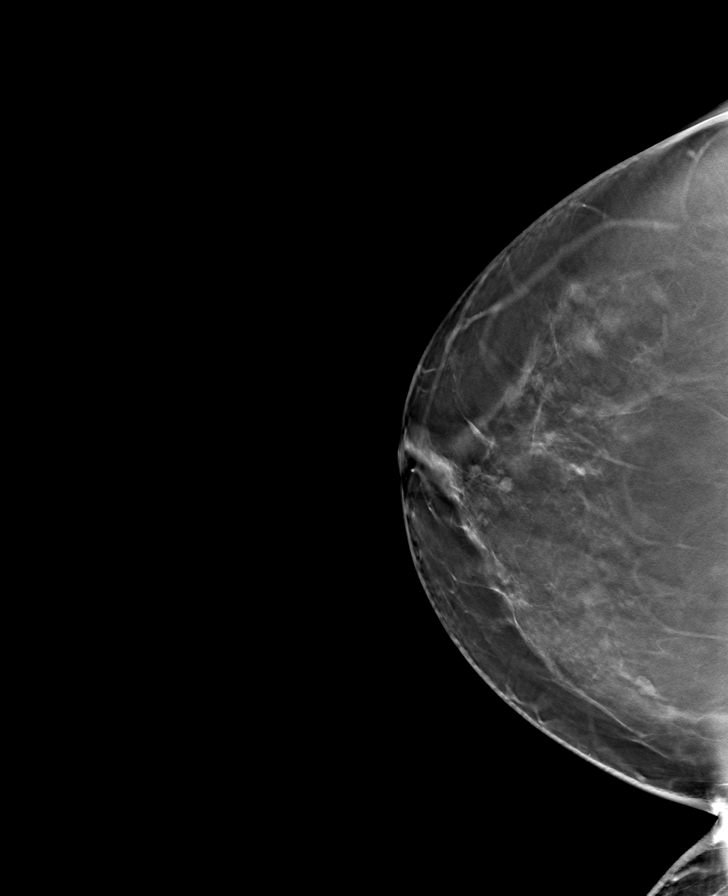

[L MLO tomo · tomo slice 45/90.0]
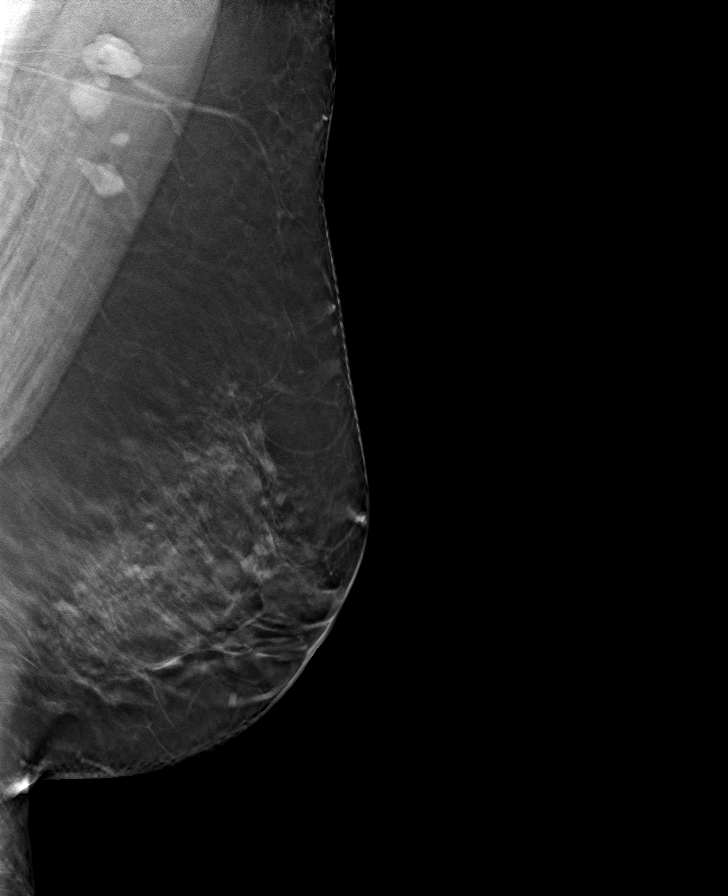

[L CC tomo · tomo slice 41/82.0]
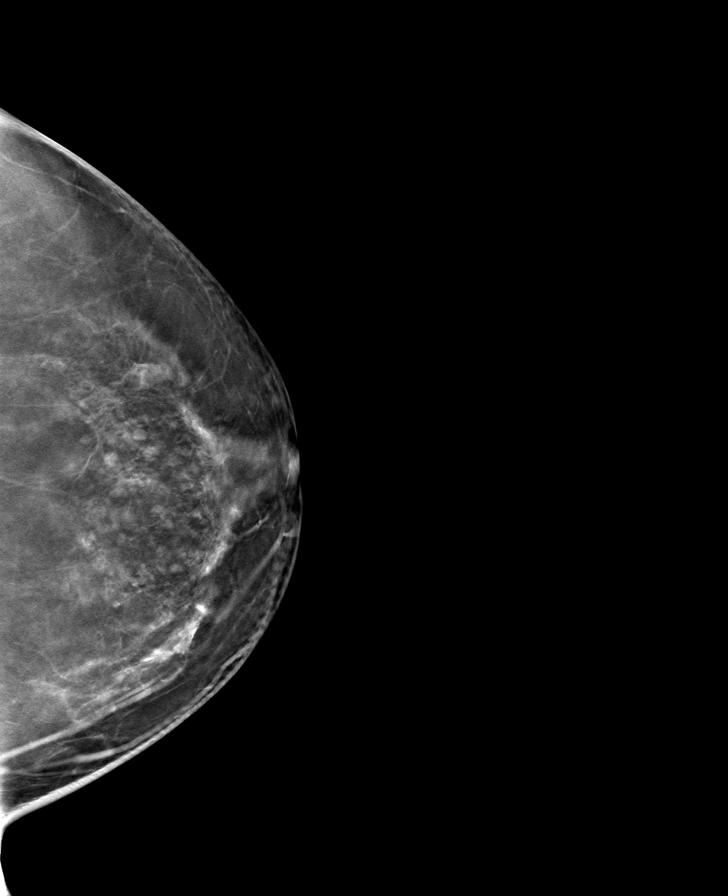

[R MLO tomo · tomo slice 46/91.0]
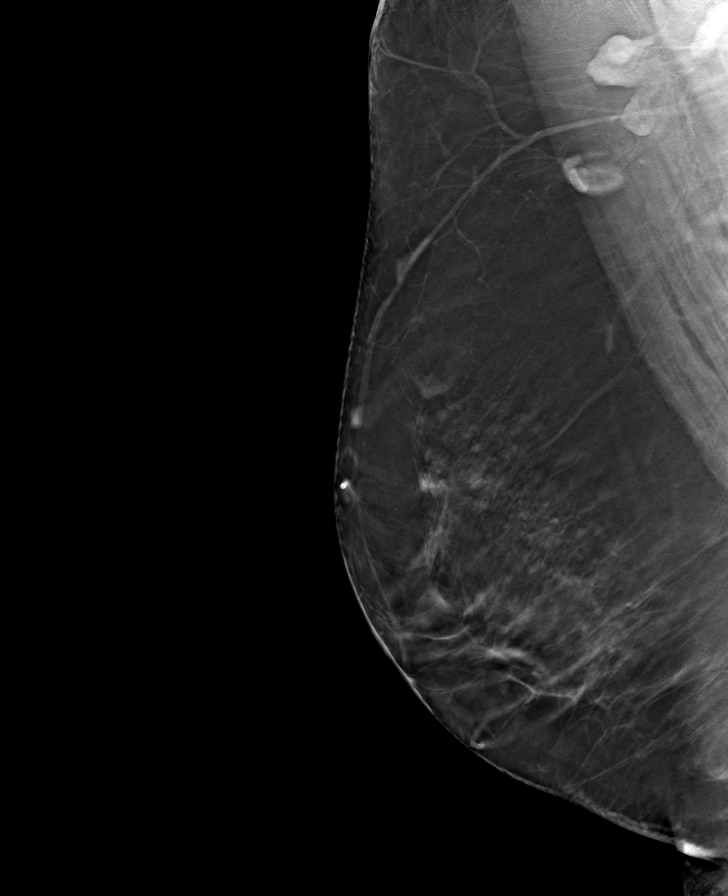

[8 of 24 positions shown; findings below may reference images not displayed]

ACR Breast Density Category b: There are scattered areas of
fibroglandular density.
FINDINGS: There are no findings suspicious for malignancy.
IMPRESSION: No mammographic evidence of malignancy. A result letter of this
screening mammogram will be mailed directly to the patient.

RECOMMENDATION:
Screening mammogram in one year. (Code:[BY])

BI-RADS CATEGORY  1: Negative.

## 2021-02-13 ENCOUNTER — Ambulatory Visit: Payer: 59 | Admitting: Family Medicine

## 2021-02-13 NOTE — Progress Notes (Deleted)
OFFICE VISIT  02/13/2021  CC: No chief complaint on file.   HPI:    Patient is a 60 y.o. female who presents for 6 week f/u insomnia. A/P as of last visit: "#1 hypertension: Well-controlled on amlodipine 5 mg/day. Electrolytes and creatinine checked today. Lipid screening today.  2. generalized anxiety disorder: She seems to be doing well with as needed use of lorazepam 0.5 mg, typically taking it about 2 times per day. Controlled substance contract today.  UDS today.  She last took lorazepam last night.   #3.  Insomnia (maintenance).  Failed trazodone and lorazepam.  Will try mirtazapine 7.5 mg nightly.   #4 abnormal chest x-ray on 11/23/2020.  Tiny right lung nodular opacity.  This was treated with steroids and antibiotics by the emergency department.  Ordered f/u cxr for pt to get in about 2 wks.   #5 abnormal brain MRI 11/23/20-->per radiologist "Small amount of nonspecific T2 hyperintense lesions of the white matter. Differential diagnosis include early chronic microangiopathy, demyelinating disease, vasculitis and other post inflammatory/infectious processes". Intermittent left arm and leg numbness.  Referral to neurology last visit, pt still working on scheduling."  INTERIM HX: Her repeat CXR was all clear 01/25/21. ***  Past Medical History:  Diagnosis Date   Acute gastritis without bleeding Fall 2019   H pylori NEG on EGD   Cervical cancer ([place])    around age 70   Colon polyps    COVID-19 virus infection 07/2020   paxlovid   Essential hypertension    GAD (generalized anxiety disorder)    GERD (gastroesophageal reflux disease)    well controlled with ranitidine bid   Hay fever    Hypercholesterolemia 12/2020   LDL 156-->frmhm risk 7%-->TLC   Hypothyroidism    IBS (irritable bowel syndrome)    Insomnia    Kidney stones    Moderate persistent asthma    Not taking controller meds b/c she said she improved significantly with cutting back on tobacco use.   Psoriasis     Tobacco dependence    ongoing as of 02/2017    Past Surgical History:  Procedure Laterality Date   APPENDECTOMY     COLONOSCOPY  2015   endometrosis     TAH/BSO 2011   ESOPHAGOGASTRODUODENOSCOPY  12/05/2017   lower third of esoph inflamed, +gastritis (H pylori NEG).  Duodenal bx Normal.   KNEE SURGERY     TONSILLECTOMY     TOTAL ABDOMINAL HYSTERECTOMY W/ BILATERAL SALPINGOOPHORECTOMY  2011   Endometriosis   TUBAL LIGATION      Outpatient Medications Prior to Visit  Medication Sig Dispense Refill   acetaminophen (TYLENOL) 500 MG tablet Take 1,000 mg by mouth every 8 (eight) hours as needed for mild pain, moderate pain or headache.     albuterol (VENTOLIN HFA) 108 (90 Base) MCG/ACT inhaler Inhale 1-2 puffs into the lungs every 6 (six) hours as needed for wheezing or shortness of breath. 6.7 g 0   amLODipine (NORVASC) 5 MG tablet Take 1 tablet (5 mg total) by mouth daily. 90 tablet 3   ipratropium-albuterol (DUONEB) 0.5-2.5 (3) MG/3ML SOLN Take 3 mLs by nebulization every 6 (six) hours as needed. (Patient not taking: No sig reported) 75 mL 0   levothyroxine (SYNTHROID) 100 MCG tablet 1 tab po qd x 6d per week and 1.5 tabs one day a week 96 tablet 0   LORazepam (ATIVAN) 0.5 MG tablet Take 1 tablet (0.5 mg total) by mouth every 8 (eight) hours as needed for  anxiety. 60 tablet 5   mirtazapine (REMERON) 7.5 MG tablet Take 1 tablet (7.5 mg total) by mouth at bedtime. 30 tablet 1   No facility-administered medications prior to visit.    Allergies  Allergen Reactions   Codeine Itching, Nausea And Vomiting and Other (See Comments)    Makes her feel wierd    ROS As per HPI  PE: Vitals with BMI 12/29/2020 11/27/2020 11/23/2020  Height 5\' 6"  5\' 6"  -  Weight 194 lbs 3 oz 192 lbs 10 oz -  BMI 38.18 29.9 -  Systolic 371 696 789  Diastolic 76 87 69  Pulse 70 69 68     Physical Exam  ***  LABS:  {Labs (Optional):23779}  IMPRESSION AND PLAN:  No problem-specific Assessment  & Plan notes found for this encounter.  Abnormal brain MRI 11/23/20-->per radiologist "Small amount of nonspecific T2 hyperintense lesions of the white matter. Differential diagnosis include early chronic microangiopathy, demyelinating disease, vasculitis and other post inflammatory/infectious processes". Intermittent left arm and leg numbness. Neuro referral was made over 2 months ago.  An After Visit Summary was printed and given to the patient.  FOLLOW UP: No follow-ups on file.  Signed:  Crissie Sickles, MD           02/13/2021

## 2021-03-02 ENCOUNTER — Emergency Department (HOSPITAL_COMMUNITY): Payer: 59

## 2021-03-02 ENCOUNTER — Inpatient Hospital Stay (HOSPITAL_COMMUNITY)
Admission: EM | Admit: 2021-03-02 | Discharge: 2021-03-21 | DRG: 917 | Disposition: E | Payer: 59 | Attending: Internal Medicine | Admitting: Internal Medicine

## 2021-03-02 ENCOUNTER — Encounter (HOSPITAL_COMMUNITY): Payer: Self-pay | Admitting: Emergency Medicine

## 2021-03-02 DIAGNOSIS — R569 Unspecified convulsions: Secondary | ICD-10-CM | POA: Diagnosis present

## 2021-03-02 DIAGNOSIS — Z66 Do not resuscitate: Secondary | ICD-10-CM | POA: Diagnosis not present

## 2021-03-02 DIAGNOSIS — F419 Anxiety disorder, unspecified: Secondary | ICD-10-CM | POA: Diagnosis present

## 2021-03-02 DIAGNOSIS — J9601 Acute respiratory failure with hypoxia: Secondary | ICD-10-CM | POA: Diagnosis present

## 2021-03-02 DIAGNOSIS — R570 Cardiogenic shock: Secondary | ICD-10-CM | POA: Diagnosis present

## 2021-03-02 DIAGNOSIS — E872 Acidosis, unspecified: Secondary | ICD-10-CM | POA: Diagnosis present

## 2021-03-02 DIAGNOSIS — I469 Cardiac arrest, cause unspecified: Secondary | ICD-10-CM | POA: Diagnosis present

## 2021-03-02 DIAGNOSIS — Z809 Family history of malignant neoplasm, unspecified: Secondary | ICD-10-CM

## 2021-03-02 DIAGNOSIS — I248 Other forms of acute ischemic heart disease: Secondary | ICD-10-CM | POA: Diagnosis present

## 2021-03-02 DIAGNOSIS — G4700 Insomnia, unspecified: Secondary | ICD-10-CM | POA: Diagnosis present

## 2021-03-02 DIAGNOSIS — Z4659 Encounter for fitting and adjustment of other gastrointestinal appliance and device: Secondary | ICD-10-CM

## 2021-03-02 DIAGNOSIS — H5509 Other forms of nystagmus: Secondary | ICD-10-CM | POA: Diagnosis present

## 2021-03-02 DIAGNOSIS — Z515 Encounter for palliative care: Secondary | ICD-10-CM | POA: Diagnosis not present

## 2021-03-02 DIAGNOSIS — G9340 Encephalopathy, unspecified: Secondary | ICD-10-CM | POA: Insufficient documentation

## 2021-03-02 DIAGNOSIS — E7800 Pure hypercholesterolemia, unspecified: Secondary | ICD-10-CM | POA: Diagnosis present

## 2021-03-02 DIAGNOSIS — J9602 Acute respiratory failure with hypercapnia: Secondary | ICD-10-CM | POA: Diagnosis present

## 2021-03-02 DIAGNOSIS — Z833 Family history of diabetes mellitus: Secondary | ICD-10-CM

## 2021-03-02 DIAGNOSIS — Z8616 Personal history of COVID-19: Secondary | ICD-10-CM | POA: Diagnosis not present

## 2021-03-02 DIAGNOSIS — T424X1A Poisoning by benzodiazepines, accidental (unintentional), initial encounter: Secondary | ICD-10-CM | POA: Diagnosis present

## 2021-03-02 DIAGNOSIS — G931 Anoxic brain damage, not elsewhere classified: Secondary | ICD-10-CM | POA: Diagnosis present

## 2021-03-02 DIAGNOSIS — R7989 Other specified abnormal findings of blood chemistry: Secondary | ICD-10-CM | POA: Insufficient documentation

## 2021-03-02 DIAGNOSIS — G9341 Metabolic encephalopathy: Secondary | ICD-10-CM | POA: Diagnosis present

## 2021-03-02 DIAGNOSIS — J4540 Moderate persistent asthma, uncomplicated: Secondary | ICD-10-CM | POA: Diagnosis present

## 2021-03-02 DIAGNOSIS — T405X1A Poisoning by cocaine, accidental (unintentional), initial encounter: Principal | ICD-10-CM | POA: Diagnosis present

## 2021-03-02 DIAGNOSIS — Z8261 Family history of arthritis: Secondary | ICD-10-CM

## 2021-03-02 DIAGNOSIS — G253 Myoclonus: Secondary | ICD-10-CM | POA: Diagnosis present

## 2021-03-02 DIAGNOSIS — F17210 Nicotine dependence, cigarettes, uncomplicated: Secondary | ICD-10-CM | POA: Diagnosis present

## 2021-03-02 DIAGNOSIS — Z789 Other specified health status: Secondary | ICD-10-CM | POA: Diagnosis not present

## 2021-03-02 DIAGNOSIS — Z8371 Family history of colonic polyps: Secondary | ICD-10-CM

## 2021-03-02 DIAGNOSIS — E876 Hypokalemia: Secondary | ICD-10-CM | POA: Diagnosis present

## 2021-03-02 DIAGNOSIS — J301 Allergic rhinitis due to pollen: Secondary | ICD-10-CM | POA: Diagnosis present

## 2021-03-02 DIAGNOSIS — F411 Generalized anxiety disorder: Secondary | ICD-10-CM | POA: Diagnosis present

## 2021-03-02 DIAGNOSIS — K7200 Acute and subacute hepatic failure without coma: Secondary | ICD-10-CM | POA: Diagnosis present

## 2021-03-02 DIAGNOSIS — Z87442 Personal history of urinary calculi: Secondary | ICD-10-CM

## 2021-03-02 DIAGNOSIS — E039 Hypothyroidism, unspecified: Secondary | ICD-10-CM | POA: Diagnosis present

## 2021-03-02 DIAGNOSIS — I10 Essential (primary) hypertension: Secondary | ICD-10-CM | POA: Diagnosis present

## 2021-03-02 DIAGNOSIS — M96A3 Multiple fractures of ribs associated with chest compression and cardiopulmonary resuscitation: Secondary | ICD-10-CM | POA: Diagnosis present

## 2021-03-02 DIAGNOSIS — Z8719 Personal history of other diseases of the digestive system: Secondary | ICD-10-CM

## 2021-03-02 DIAGNOSIS — Z90710 Acquired absence of both cervix and uterus: Secondary | ICD-10-CM

## 2021-03-02 DIAGNOSIS — Z885 Allergy status to narcotic agent status: Secondary | ICD-10-CM

## 2021-03-02 DIAGNOSIS — Z83438 Family history of other disorder of lipoprotein metabolism and other lipidemia: Secondary | ICD-10-CM

## 2021-03-02 DIAGNOSIS — Z79899 Other long term (current) drug therapy: Secondary | ICD-10-CM

## 2021-03-02 DIAGNOSIS — R739 Hyperglycemia, unspecified: Secondary | ICD-10-CM | POA: Insufficient documentation

## 2021-03-02 DIAGNOSIS — Z90722 Acquired absence of ovaries, bilateral: Secondary | ICD-10-CM

## 2021-03-02 DIAGNOSIS — Z823 Family history of stroke: Secondary | ICD-10-CM

## 2021-03-02 DIAGNOSIS — Z79890 Hormone replacement therapy (postmenopausal): Secondary | ICD-10-CM

## 2021-03-02 DIAGNOSIS — K219 Gastro-esophageal reflux disease without esophagitis: Secondary | ICD-10-CM | POA: Diagnosis present

## 2021-03-02 DIAGNOSIS — Z8249 Family history of ischemic heart disease and other diseases of the circulatory system: Secondary | ICD-10-CM

## 2021-03-02 DIAGNOSIS — Z8541 Personal history of malignant neoplasm of cervix uteri: Secondary | ICD-10-CM

## 2021-03-02 LAB — I-STAT ARTERIAL BLOOD GAS, ED
Acid-base deficit: 16 mmol/L — ABNORMAL HIGH (ref 0.0–2.0)
Acid-base deficit: 11 mmol/L — ABNORMAL HIGH (ref 0.0–2.0)
Bicarbonate: 14.6 mmol/L — ABNORMAL LOW (ref 20.0–28.0)
Bicarbonate: 18.2 mmol/L — ABNORMAL LOW (ref 20.0–28.0)
Calcium, Ion: 1.11 mmol/L — ABNORMAL LOW (ref 1.15–1.40)
Calcium, Ion: 1.11 mmol/L — ABNORMAL LOW (ref 1.15–1.40)
HCT: 42 % (ref 36.0–46.0)
HCT: 39 % (ref 36.0–46.0)
Hemoglobin: 14.3 g/dL (ref 12.0–15.0)
Hemoglobin: 13.3 g/dL (ref 12.0–15.0)
O2 Saturation: 100 %
O2 Saturation: 91 %
Patient temperature: 98.6
Patient temperature: 97.6
Potassium: 3.3 mmol/L — ABNORMAL LOW (ref 3.5–5.1)
Potassium: 3.2 mmol/L — ABNORMAL LOW (ref 3.5–5.1)
Sodium: 136 mmol/L (ref 135–145)
Sodium: 141 mmol/L (ref 135–145)
TCO2: 16 mmol/L — ABNORMAL LOW (ref 22–32)
TCO2: 20 mmol/L — ABNORMAL LOW (ref 22–32)
pCO2 arterial: 50.5 mmHg — ABNORMAL HIGH (ref 32.0–48.0)
pCO2 arterial: 51.7 mmHg — ABNORMAL HIGH (ref 32.0–48.0)
pH, Arterial: 7.068 — CL (ref 7.350–7.450)
pH, Arterial: 7.152 — CL (ref 7.350–7.450)
pO2, Arterial: 308 mmHg — ABNORMAL HIGH (ref 83.0–108.0)
pO2, Arterial: 78 mmHg — ABNORMAL LOW (ref 83.0–108.0)

## 2021-03-02 LAB — CBC WITH DIFFERENTIAL/PLATELET
Abs Immature Granulocytes: 0 10*3/uL (ref 0.00–0.07)
Basophils Absolute: 0 10*3/uL (ref 0.0–0.1)
Basophils Relative: 0 %
Eosinophils Absolute: 0.8 10*3/uL — ABNORMAL HIGH (ref 0.0–0.5)
Eosinophils Relative: 4 %
HCT: 46.9 % — ABNORMAL HIGH (ref 36.0–46.0)
Hemoglobin: 14.9 g/dL (ref 12.0–15.0)
Lymphocytes Relative: 46 %
Lymphs Abs: 9.6 10*3/uL — ABNORMAL HIGH (ref 0.7–4.0)
MCH: 28.8 pg (ref 26.0–34.0)
MCHC: 31.8 g/dL (ref 30.0–36.0)
MCV: 90.7 fL (ref 80.0–100.0)
Monocytes Absolute: 0.8 10*3/uL (ref 0.1–1.0)
Monocytes Relative: 4 %
Neutro Abs: 9.6 10*3/uL — ABNORMAL HIGH (ref 1.7–7.7)
Neutrophils Relative %: 46 %
Platelets: 235 10*3/uL (ref 150–400)
RBC: 5.17 MIL/uL — ABNORMAL HIGH (ref 3.87–5.11)
RDW: 13.4 % (ref 11.5–15.5)
WBC: 20.9 10*3/uL — ABNORMAL HIGH (ref 4.0–10.5)
nRBC: 0 /100 WBC
nRBC: 0.4 % — ABNORMAL HIGH (ref 0.0–0.2)

## 2021-03-02 LAB — TSH: TSH: 33.236 u[IU]/mL — ABNORMAL HIGH (ref 0.350–4.500)

## 2021-03-02 LAB — COMPREHENSIVE METABOLIC PANEL
ALT: 309 U/L — ABNORMAL HIGH (ref 0–44)
AST: 295 U/L — ABNORMAL HIGH (ref 15–41)
Albumin: 3.7 g/dL (ref 3.5–5.0)
Alkaline Phosphatase: 99 U/L (ref 38–126)
Anion gap: 17 — ABNORMAL HIGH (ref 5–15)
BUN: 10 mg/dL (ref 6–20)
CO2: 17 mmol/L — ABNORMAL LOW (ref 22–32)
Calcium: 8.6 mg/dL — ABNORMAL LOW (ref 8.9–10.3)
Chloride: 103 mmol/L (ref 98–111)
Creatinine, Ser: 1.03 mg/dL — ABNORMAL HIGH (ref 0.44–1.00)
GFR, Estimated: >60 mL/min (ref >60)
Glucose, Bld: 404 mg/dL — ABNORMAL HIGH (ref 70–99)
Potassium: 3.5 mmol/L (ref 3.5–5.1)
Sodium: 137 mmol/L (ref 135–145)
Total Bilirubin: 0.6 mg/dL (ref 0.3–1.2)
Total Protein: 6.4 g/dL — ABNORMAL LOW (ref 6.5–8.1)

## 2021-03-02 LAB — LACTIC ACID, PLASMA
Lactic Acid, Venous: >9 mmol/L (ref 0.5–1.9)
Lactic Acid, Venous: 7.6 mmol/L (ref 0.5–1.9)

## 2021-03-02 LAB — AMMONIA: Ammonia: 48 umol/L — ABNORMAL HIGH (ref 9–35)

## 2021-03-02 LAB — HIV ANTIBODY (ROUTINE TESTING W REFLEX): HIV Screen 4th Generation wRfx: NONREACTIVE

## 2021-03-02 LAB — RESP PANEL BY RT-PCR (FLU A&B, COVID) ARPGX2
Influenza A by PCR: NEGATIVE
Influenza B by PCR: NEGATIVE
SARS Coronavirus 2 by RT PCR: NEGATIVE

## 2021-03-02 LAB — SALICYLATE LEVEL: Salicylate Lvl: <7 mg/dL — ABNORMAL LOW (ref 7.0–30.0)

## 2021-03-02 LAB — MAGNESIUM: Magnesium: 2.9 mg/dL — ABNORMAL HIGH (ref 1.7–2.4)

## 2021-03-02 LAB — ETHANOL: Alcohol, Ethyl (B): 82 mg/dL — ABNORMAL HIGH (ref <10)

## 2021-03-02 LAB — TROPONIN I (HIGH SENSITIVITY): Troponin I (High Sensitivity): 182 ng/L (ref <18)

## 2021-03-02 IMAGING — CT CT HEAD W/O CM
4 series · 17 of 47 positions shown, 19 images · non-contrast
Comparison: MRI from [DATE]

CLINICAL DATA: Recent arrest with CPR



[Series 3: head wo · axial · 0.39mm/px · z∈[+1254,+1374]mm · 7 of 34 slices shown, 9 images]
[im 5/34  brain]
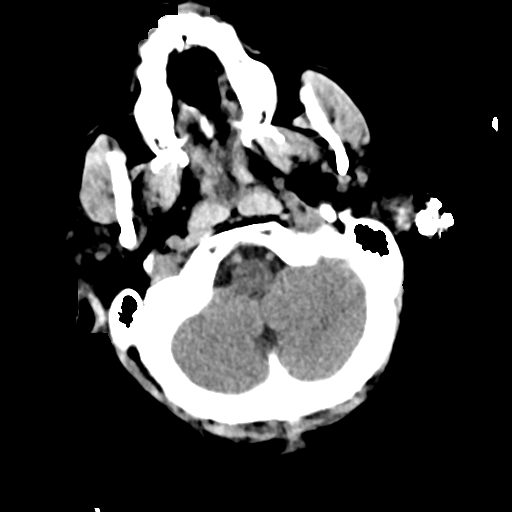
[im 5/34  bone]
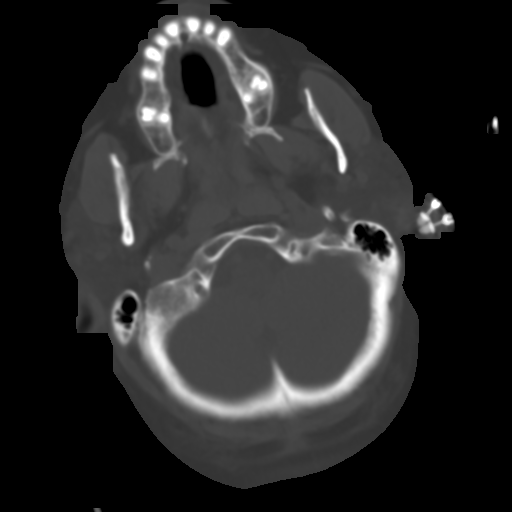
[im 9/34  brain]
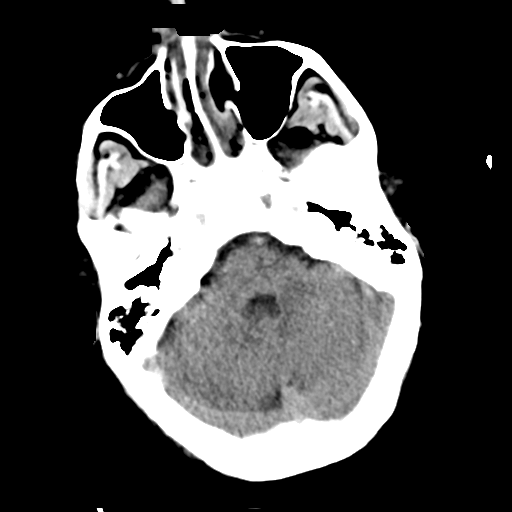
[im 13/34  brain]
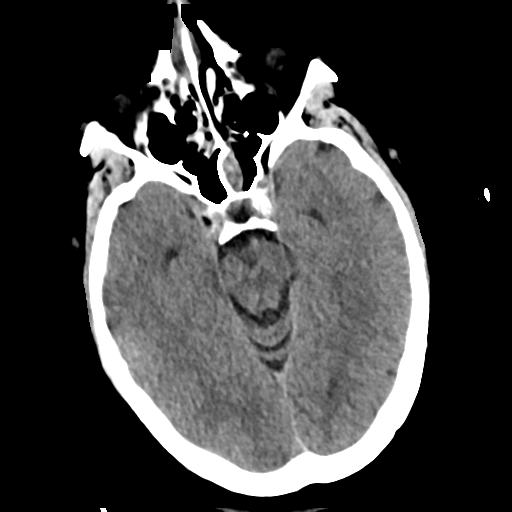
[im 17/34  brain]
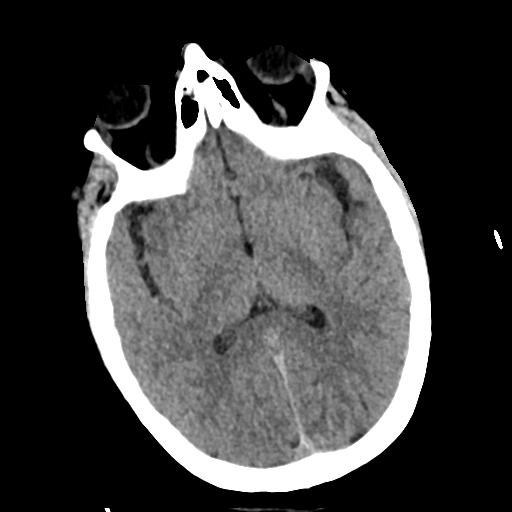
[im 21/34  brain]
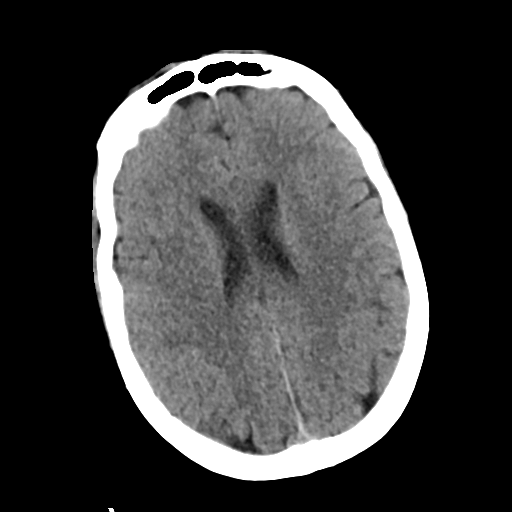
[im 21/34  bone]
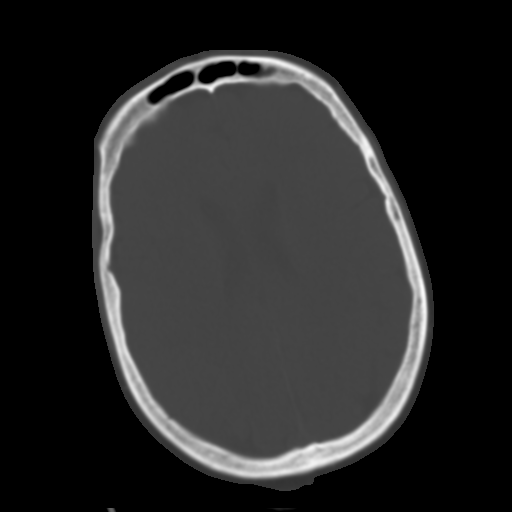
[im 25/34  brain]
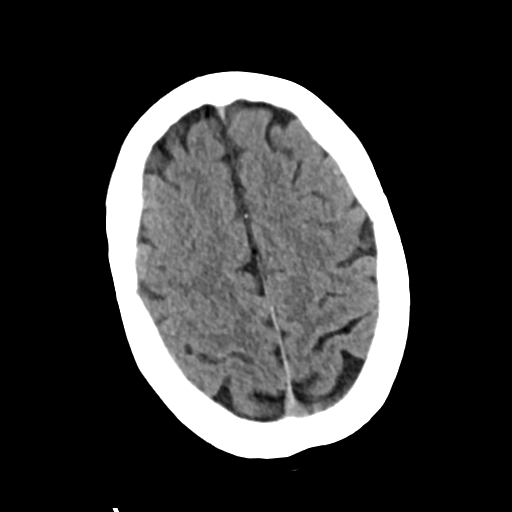
[im 29/34  brain]
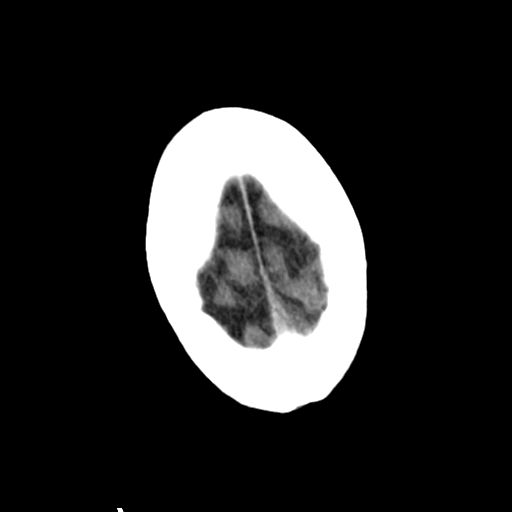

[Series 4: head bone · axial · 0.39mm/px · z∈[+1250,+1306]mm · 4 of 83 slices shown]
[im 9/83  bone]
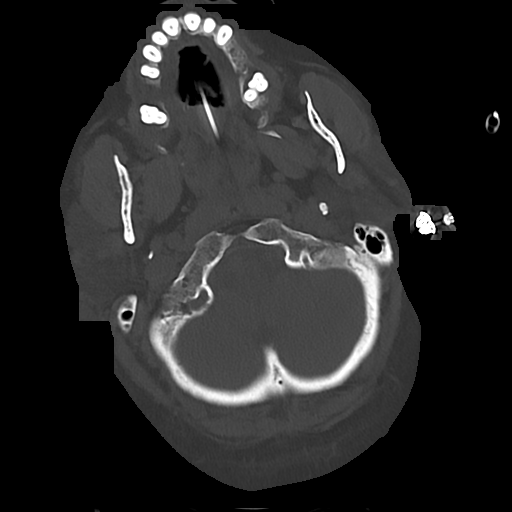
[im 17/83  bone]
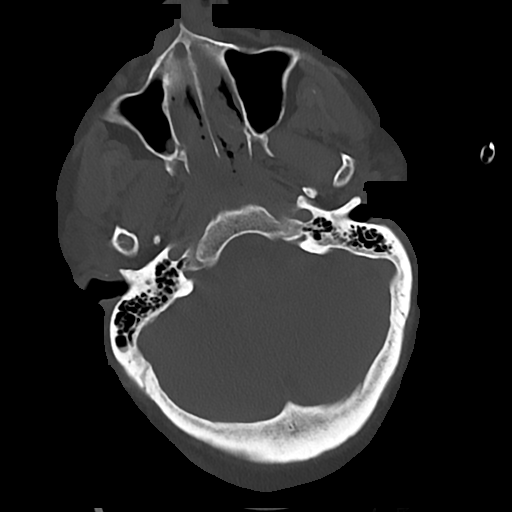
[im 25/83  bone]
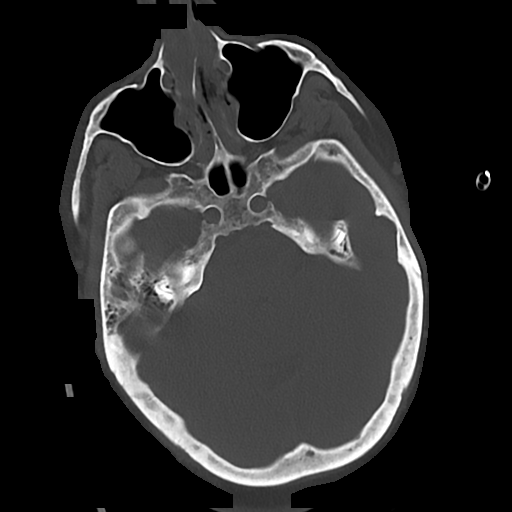
[im 37/83  bone]
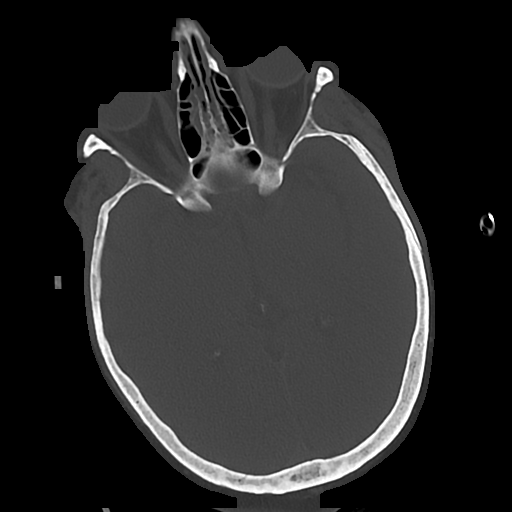

[Series 5: cor soft · coronal · 0.33mm/px · 3 of 73 slices shown]
[im 25/73  brain]
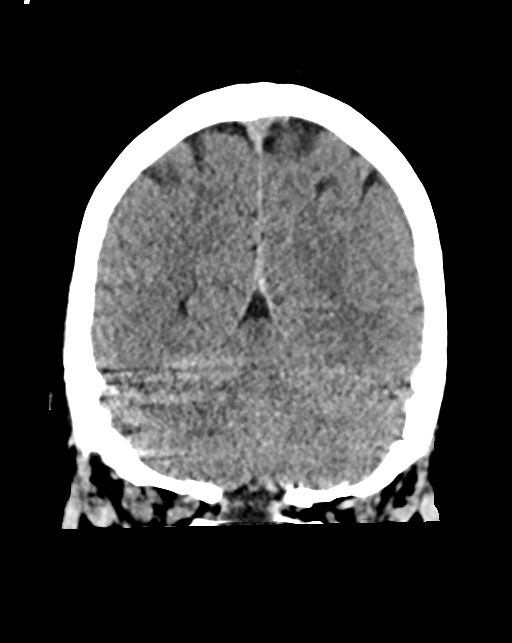
[im 33/73  brain]
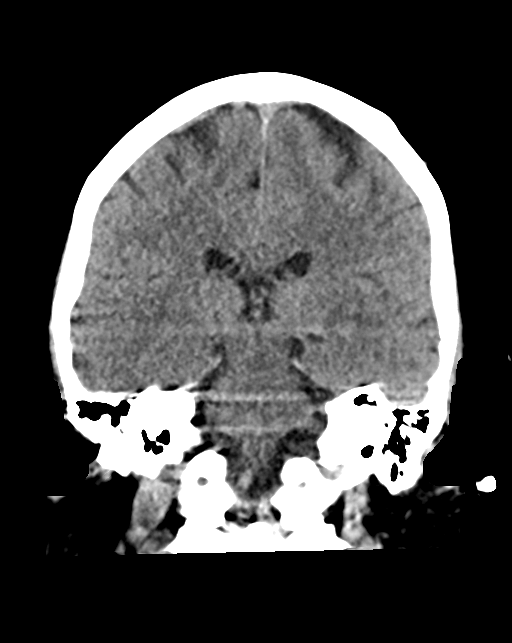
[im 41/73  brain]
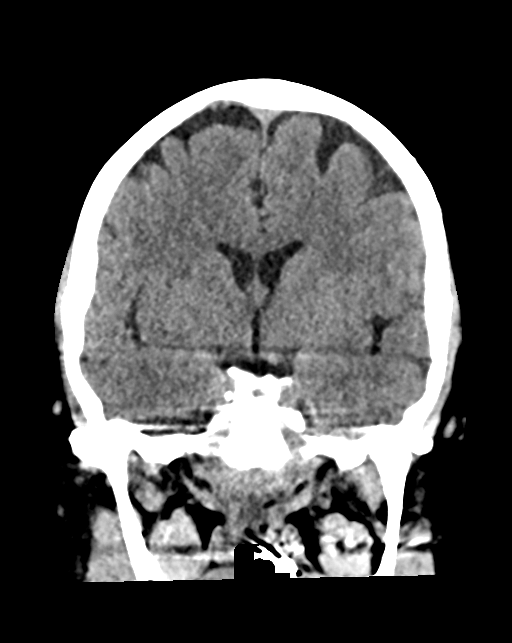

[Series 6: sag soft · sagittal · 0.41mm/px · 3 of 56 slices shown]
[im 19/56  brain]
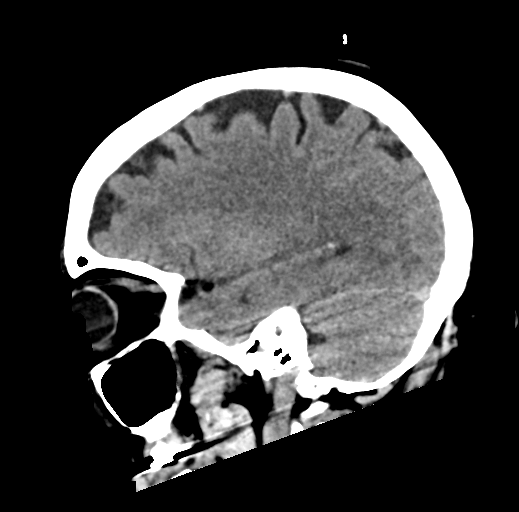
[im 28/56  brain]
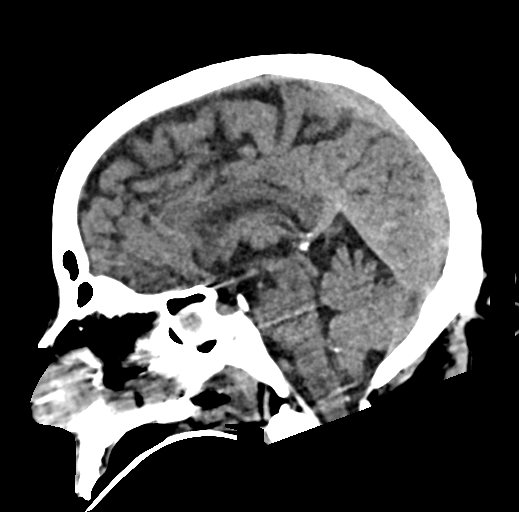
[im 37/56  brain]
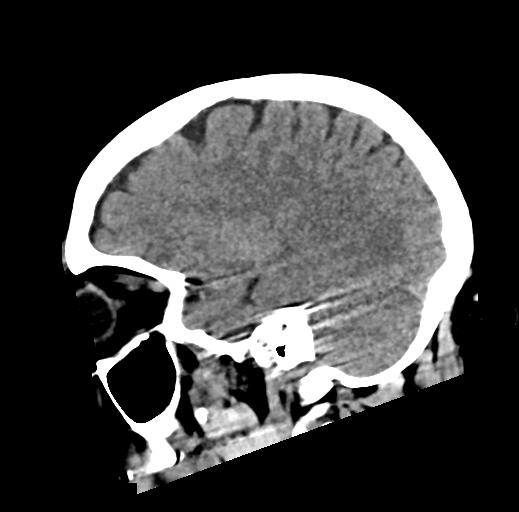

[17 of 47 positions shown; findings below may reference images not displayed]

FINDINGS: Brain: No evidence of acute infarction, hemorrhage, hydrocephalus,
extra-axial collection or mass lesion/mass effect.

Vascular: No hyperdense vessel or unexpected calcification.

Skull: Normal. Negative for fracture or focal lesion.

Sinuses/Orbits: Mucosal thickening in the nasal passages is noted.
Paranasal sinuses appear within normal limits. Orbits and their
contents are unremarkable.

Other: None
IMPRESSION: No acute intracranial abnormality noted.

## 2021-03-02 IMAGING — DX DG ABDOMEN 1V
1 series · 1 of 1 positions shown · non-contrast
Comparison: None.

CLINICAL DATA: OG tube placement.

EXAM:
ABDOMEN - 1 VIEW

[abdomen]
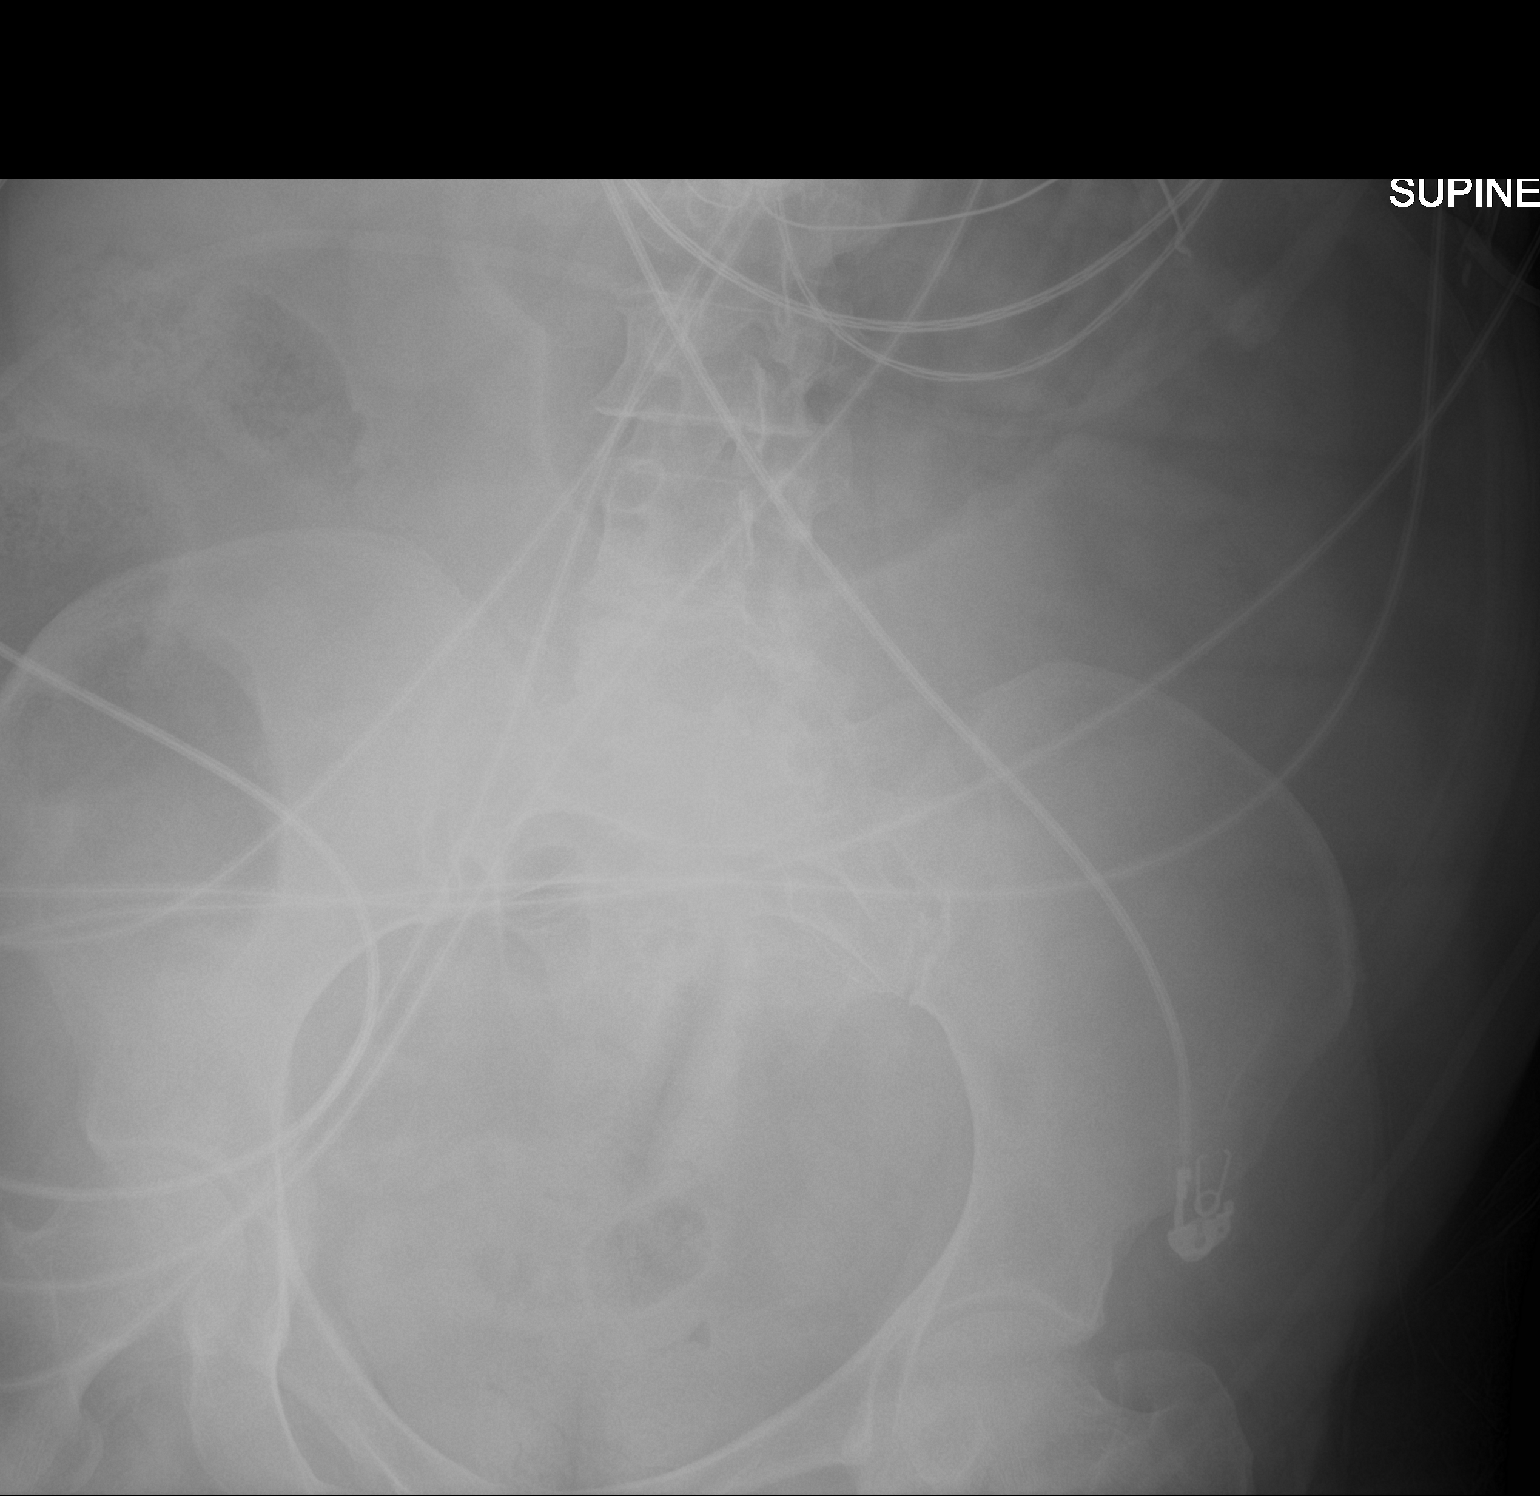

[1 of 1 positions shown; findings below may reference images not displayed]

FINDINGS: Multiple overlying radiopaque lead wires are noted. An orogastric
tube is seen with its distal tip noted within the body of the
stomach. The bowel gas pattern is normal. No radio-opaque calculi or
other significant radiographic abnormality are seen.
IMPRESSION: Orogastric tube positioning, as described above.

## 2021-03-02 MED ORDER — ONDANSETRON HCL 4 MG/2ML IJ SOLN: 4.0000 mg | Freq: Four times a day (QID) | INTRAMUSCULAR | Status: DC | PRN

## 2021-03-02 MED ORDER — MIDAZOLAM HCL 2 MG/2ML IJ SOLN: 2.0000 mg | INTRAMUSCULAR | Status: DC | PRN

## 2021-03-02 MED ORDER — INSULIN ASPART 100 UNIT/ML IJ SOLN
0.0000 [IU] | INTRAMUSCULAR | Status: DC
  Administered 2021-03-03 (×2): 5 [IU] via SUBCUTANEOUS

## 2021-03-02 MED ORDER — LACTULOSE 10 GM/15ML PO SOLN
20.0000 g | Freq: Once | ORAL | Status: AC
  Administered 2021-03-03: 20 g
  Filled 2021-03-02: qty 30

## 2021-03-02 MED ORDER — MIDAZOLAM HCL 2 MG/2ML IJ SOLN
2.0000 mg | INTRAMUSCULAR | Status: AC | PRN
  Administered 2021-03-04 (×3): 2 mg via INTRAVENOUS
  Filled 2021-03-02 (×3): qty 2

## 2021-03-02 MED ORDER — FENTANYL 2500MCG IN NS 250ML (10MCG/ML) PREMIX INFUSION
25.0000 ug/h | INTRAVENOUS | Status: DC
  Administered 2021-03-02: 50 ug/h via INTRAVENOUS
  Administered 2021-03-02: 25 ug/h via INTRAVENOUS
  Filled 2021-03-02: qty 250

## 2021-03-02 MED ORDER — HEPARIN SODIUM (PORCINE) 5000 UNIT/ML IJ SOLN
5000.0000 [IU] | Freq: Three times a day (TID) | INTRAMUSCULAR | Status: DC
  Administered 2021-03-03 – 2021-03-04 (×6): 5000 [IU] via SUBCUTANEOUS
  Filled 2021-03-02 (×6): qty 1

## 2021-03-02 MED ORDER — FOLIC ACID 1 MG PO TABS
1.0000 mg | ORAL_TABLET | Freq: Every day | ORAL | Status: DC
  Administered 2021-03-03 – 2021-03-04 (×3): 1 mg
  Filled 2021-03-02 (×3): qty 1

## 2021-03-02 MED ORDER — BUSPIRONE HCL 15 MG PO TABS: 30.0000 mg | ORAL_TABLET | Freq: Three times a day (TID) | ORAL | Status: AC | PRN

## 2021-03-02 MED ORDER — CHLORHEXIDINE GLUCONATE 0.12% ORAL RINSE (MEDLINE KIT)
15.0000 mL | Freq: Two times a day (BID) | OROMUCOSAL | Status: DC
  Administered 2021-03-03 – 2021-03-04 (×4): 15 mL via OROMUCOSAL

## 2021-03-02 MED ORDER — MIDAZOLAM HCL 2 MG/2ML IJ SOLN
INTRAMUSCULAR | Status: AC
  Filled 2021-03-02: qty 2

## 2021-03-02 MED ORDER — ACETAMINOPHEN 650 MG RE SUPP: 650.0000 mg | RECTAL | Status: DC | PRN

## 2021-03-02 MED ORDER — POTASSIUM CHLORIDE 20 MEQ PO PACK
20.0000 meq | PACK | Freq: Once | ORAL | Status: DC
  Filled 2021-03-02: qty 1

## 2021-03-02 MED ORDER — LEVETIRACETAM IN NACL 1500 MG/100ML IV SOLN
1500.0000 mg | INTRAVENOUS | Status: AC
  Administered 2021-03-02 (×2): 1500 mg via INTRAVENOUS
  Filled 2021-03-02 (×2): qty 100

## 2021-03-02 MED ORDER — FENTANYL CITRATE PF 50 MCG/ML IJ SOSY: 50.0000 ug | PREFILLED_SYRINGE | INTRAMUSCULAR | Status: DC | PRN

## 2021-03-02 MED ORDER — FENTANYL BOLUS VIA INFUSION
50.0000 ug | INTRAVENOUS | Status: DC | PRN
  Filled 2021-03-02: qty 50

## 2021-03-02 MED ORDER — POTASSIUM CHLORIDE 10 MEQ/100ML IV SOLN
10.0000 meq | INTRAVENOUS | Status: AC
  Administered 2021-03-03 (×4): 10 meq via INTRAVENOUS
  Filled 2021-03-02 (×4): qty 100

## 2021-03-02 MED ORDER — POLYETHYLENE GLYCOL 3350 17 G PO PACK
17.0000 g | PACK | Freq: Every day | ORAL | Status: DC
  Administered 2021-03-03 – 2021-03-04 (×2): 17 g
  Filled 2021-03-02 (×2): qty 1

## 2021-03-02 MED ORDER — LEVETIRACETAM 100 MG/ML PO SOLN: 500.0000 mg | Freq: Two times a day (BID) | ORAL | Status: DC

## 2021-03-02 MED ORDER — LEVOTHYROXINE SODIUM 100 MCG PO TABS
100.0000 ug | ORAL_TABLET | Freq: Every day | ORAL | Status: DC
  Administered 2021-03-03: 100 ug
  Filled 2021-03-02 (×2): qty 1

## 2021-03-02 MED ORDER — DOCUSATE SODIUM 50 MG/5ML PO LIQD
100.0000 mg | Freq: Two times a day (BID) | ORAL | Status: DC
  Administered 2021-03-03 – 2021-03-04 (×3): 100 mg
  Filled 2021-03-02 (×3): qty 10

## 2021-03-02 MED ORDER — NOREPINEPHRINE 4 MG/250ML-% IV SOLN
2.0000 ug/min | INTRAVENOUS | Status: DC
  Administered 2021-03-03: 4 ug/min via INTRAVENOUS
  Administered 2021-03-03 – 2021-03-04 (×2): 2 ug/min via INTRAVENOUS
  Filled 2021-03-02 (×4): qty 250

## 2021-03-02 MED ORDER — MIDAZOLAM-SODIUM CHLORIDE 100-0.9 MG/100ML-% IV SOLN
0.0000 mg/h | INTRAVENOUS | Status: DC
  Administered 2021-03-02: 2 mg/h via INTRAVENOUS
  Filled 2021-03-02: qty 100

## 2021-03-02 MED ORDER — PROPOFOL 1000 MG/100ML IV EMUL
0.0000 ug/kg/min | INTRAVENOUS | Status: DC
  Administered 2021-03-03: 20 ug/kg/min via INTRAVENOUS
  Administered 2021-03-03: 10 ug/kg/min via INTRAVENOUS
  Filled 2021-03-02: qty 200
  Filled 2021-03-02: qty 100

## 2021-03-02 MED ORDER — FENTANYL CITRATE (PF) 100 MCG/2ML IJ SOLN
INTRAMUSCULAR | Status: AC
  Filled 2021-03-02: qty 2

## 2021-03-02 MED ORDER — ORAL CARE MOUTH RINSE
15.0000 mL | OROMUCOSAL | Status: DC
  Administered 2021-03-03 – 2021-03-04 (×18): 15 mL via OROMUCOSAL

## 2021-03-02 MED ORDER — ADULT MULTIVITAMIN W/MINERALS CH
1.0000 | ORAL_TABLET | Freq: Every day | ORAL | Status: DC
  Administered 2021-03-03 – 2021-03-04 (×3): 1
  Filled 2021-03-02 (×3): qty 1

## 2021-03-02 MED ORDER — LEVETIRACETAM IN NACL 1500 MG/100ML IV SOLN
1500.0000 mg | Freq: Once | INTRAVENOUS | Status: AC
  Administered 2021-03-03: 1500 mg via INTRAVENOUS
  Filled 2021-03-02 (×2): qty 100

## 2021-03-02 MED ORDER — ACETAMINOPHEN 160 MG/5ML PO SOLN
650.0000 mg | ORAL | Status: AC
  Administered 2021-03-03 – 2021-03-04 (×6): 650 mg
  Filled 2021-03-02 (×6): qty 20.3

## 2021-03-02 MED ORDER — DOCUSATE SODIUM 100 MG PO CAPS: 100.0000 mg | ORAL_CAPSULE | Freq: Two times a day (BID) | ORAL | Status: DC | PRN

## 2021-03-02 MED ORDER — FENTANYL CITRATE PF 50 MCG/ML IJ SOSY
100.0000 ug | PREFILLED_SYRINGE | Freq: Once | INTRAMUSCULAR | Status: AC
  Administered 2021-03-02: 100 ug via INTRAVENOUS

## 2021-03-02 MED ORDER — LACTATED RINGERS IV BOLUS: 1000.0000 mL | Freq: Once | INTRAVENOUS | Status: DC

## 2021-03-02 MED ORDER — POLYETHYLENE GLYCOL 3350 17 G PO PACK: 17.0000 g | PACK | Freq: Every day | ORAL | Status: DC | PRN

## 2021-03-02 MED ORDER — LACTATED RINGERS IV BOLUS
1000.0000 mL | Freq: Once | INTRAVENOUS | Status: AC
Start: 2021-03-02 — End: 2021-03-03
  Administered 2021-03-03: 1000 mL via INTRAVENOUS

## 2021-03-02 MED ORDER — ACETAMINOPHEN 650 MG RE SUPP: 650.0000 mg | RECTAL | Status: AC

## 2021-03-02 MED ORDER — ACETAMINOPHEN 160 MG/5ML PO SOLN: 650.0000 mg | ORAL | Status: DC | PRN

## 2021-03-02 MED ORDER — CHLORHEXIDINE GLUCONATE CLOTH 2 % EX PADS
6.0000 | MEDICATED_PAD | Freq: Every day | CUTANEOUS | Status: DC
  Administered 2021-03-02 – 2021-03-04 (×3): 6 via TOPICAL

## 2021-03-02 MED ORDER — SODIUM CHLORIDE 0.9 % IV SOLN
2.0000 g | INTRAVENOUS | Status: DC
  Administered 2021-03-03 (×2): 2 g via INTRAVENOUS
  Filled 2021-03-02 (×2): qty 20

## 2021-03-02 MED ORDER — SODIUM CHLORIDE 0.9 % IV SOLN
250.0000 mL | INTRAVENOUS | Status: DC
  Administered 2021-03-03: 250 mL via INTRAVENOUS

## 2021-03-02 MED ORDER — EPINEPHRINE 1 MG/10ML IJ SOSY
PREFILLED_SYRINGE | INTRAMUSCULAR | Status: AC | PRN
  Administered 2021-03-02 (×2): 1 mg via INTRAVENOUS

## 2021-03-02 MED ORDER — IPRATROPIUM-ALBUTEROL 0.5-2.5 (3) MG/3ML IN SOLN: 3.0000 mL | RESPIRATORY_TRACT | Status: DC | PRN

## 2021-03-02 MED ORDER — EPINEPHRINE HCL 5 MG/250ML IV SOLN IN NS
0.5000 ug/min | INTRAVENOUS | Status: DC
  Administered 2021-03-02: 5 ug/min via INTRAVENOUS

## 2021-03-02 MED ORDER — MIDAZOLAM BOLUS VIA INFUSION
1.0000 mg | INTRAVENOUS | Status: DC | PRN
  Filled 2021-03-02: qty 2

## 2021-03-02 MED ORDER — PANTOPRAZOLE 2 MG/ML SUSPENSION
40.0000 mg | Freq: Every day | ORAL | Status: DC
  Administered 2021-03-03 – 2021-03-04 (×2): 40 mg
  Filled 2021-03-02 (×2): qty 20

## 2021-03-02 MED ORDER — THIAMINE HCL 100 MG PO TABS
100.0000 mg | ORAL_TABLET | Freq: Every day | ORAL | Status: DC
  Administered 2021-03-03 – 2021-03-04 (×3): 100 mg
  Filled 2021-03-02 (×3): qty 1

## 2021-03-02 NOTE — Progress Notes (Signed)
An USGPIV (ultrasound guided PIV) has been placed for short-term vasopressor infusion. A correctly placed ivWatch must be used when administering Vasopressors. Should this treatment be needed beyond 72 hours, central line access should be obtained.  It will be the responsibility of the bedside nurse to follow best practice to prevent extravasations.   ?

## 2021-03-02 NOTE — ED Provider Notes (Signed)
[facility] EMERGENCY DEPARTMENT Provider Note   CSN: [id number] Arrival date & time:        History  Chief Complaint  Patient presents with   CPR    Jamie Mercado is a 61 y.o. female with past medical history significant for HTN, anxiety, GERD, HLD, hypothyroidism, remote history of cervical cancer, tobacco abuse after PEA arrest.  The patient was found down in cardiac arrest in a hotel room.  There was reportedly a wellness check called on the patient at the time she was found down.  It is unknown how long she had been down for prior to initiation of CPR.  EMS reports CPR of approximately 45 minutes from time of arrival on scene to the emergency department.  She received 4 doses of epi prior to arrival in the emergency department with the most recent dose being given just as they were pulling into the emergency department. The patient has had intermittent ROSC but is receiving active CPR via [place] device on arrival to the ED.     Home Medications Prior to Admission medications   Medication Sig Start Date End Date Taking? Authorizing Provider  acetaminophen (TYLENOL) 500 MG tablet Take 1,000 mg by mouth every 8 (eight) hours as needed for mild pain, moderate pain or headache.    [provider]  albuterol (VENTOLIN HFA) 108 (90 Base) MCG/ACT inhaler Inhale 1-2 puffs into the lungs every 6 (six) hours as needed for wheezing or shortness of breath. 11/23/20   Long, Wonda Olds, MD  amLODipine (NORVASC) 5 MG tablet Take 1 tablet (5 mg total) by mouth daily. 12/29/20   McGowen, Adrian Blackwater, MD  ipratropium-albuterol (DUONEB) 0.5-2.5 (3) MG/3ML SOLN Take 3 mLs by nebulization every 6 (six) hours as needed. Patient not taking: No sig reported 08/11/20   Tammi Sou, MD  levothyroxine (SYNTHROID) 100 MCG tablet 1 tab po qd x 6d per week and 1.5 tabs one day a week 12/13/20   McGowen, Adrian Blackwater, MD  LORazepam (ATIVAN) 0.5 MG tablet Take 1 tablet (0.5 mg total) by mouth  every 8 (eight) hours as needed for anxiety. 12/29/20   McGowen, Adrian Blackwater, MD  mirtazapine (REMERON) 7.5 MG tablet Take 1 tablet (7.5 mg total) by mouth at bedtime. 12/29/20   McGowen, Adrian Blackwater, MD      Allergies    Codeine    Review of Systems   Review of Systems  Unable to perform ROS: Patient unresponsive   Physical Exam Updated Vital Signs BP (!) 134/101    Pulse 97    Resp 18    Ht _0  (1.676 m)    SpO2 100%    BMI 31.34 kg/m  Physical Exam Constitutional:      Appearance: She is ill-appearing and toxic-appearing.  HENT:     Head: Normocephalic and atraumatic.     Right Ear: External ear normal.     Left Ear: External ear normal.     Nose: Nose normal.     Mouth/Throat:     Pharynx: Oropharynx is clear.  Eyes:     Comments: Pupils are 6 mm and equal bilaterally, sluggish response to light  Cardiovascular:     Comments: PEA Pulmonary:     Comments: Equal breath sounds bilaterally with ventilation via BVM through a King airway Abdominal:     Palpations: Abdomen is soft.  Musculoskeletal:        General: No deformity or signs of injury.  Neurological:  Mental Status: She is unresponsive.     GCS: GCS eye subscore is 1. GCS verbal subscore is 1. GCS motor subscore is 1.    ED Results / Procedures / Treatments   Labs (all labs ordered are listed, but only abnormal results are displayed) Labs Reviewed  I-STAT ARTERIAL BLOOD GAS, ED - Abnormal; Notable for the following components:      Result Value   pH, Arterial 7.068 (*)    pCO2 arterial 50.5 (*)    pO2, Arterial 308 (*)    Bicarbonate 14.6 (*)    TCO2 16 (*)    Acid-base deficit 16.0 (*)    Potassium 3.3 (*)    Calcium, Ion 1.11 (*)    All other components within normal limits  RESP PANEL BY RT-PCR (FLU A&B, COVID) ARPGX2  CBC WITH DIFFERENTIAL/PLATELET  COMPREHENSIVE METABOLIC PANEL  LACTIC ACID, PLASMA  LACTIC ACID, PLASMA  RAPID URINE DRUG SCREEN, HOSP PERFORMED  URINALYSIS, ROUTINE W REFLEX  MICROSCOPIC  ETHANOL  SALICYLATE LEVEL  MAGNESIUM  BLOOD GAS, ARTERIAL  TROPONIN I (HIGH SENSITIVITY)    EKG EKG Interpretation  Date/Time:  Friday March 02 2021 20:26:59 EST Ventricular Rate:  107 PR Interval:  157 QRS Duration: 115 QT Interval:  383 QTC Calculation: 511 R Axis:   69 Text Interpretation: Sinus tachycardia Nonspecific intraventricular conduction delay Minimal ST depression, inferior leads When compared to prior, faster rate. NO STEMI Confirmed by Antony Blackbird [phone]) on 03/07/2021 8:34:44 PM  Radiology No results found.  Procedures Procedure Name: Intubation Date/Time: 03/04/2021 8:42 PM Performed by: Varney Baas, MD Pre-anesthesia Checklist: Suction available, Emergency Drugs available and Patient being monitored Oxygen Delivery Method: Ambu bag Preoxygenation: Pre-oxygenation with 100% oxygen Laryngoscope Size: Mac and 3 Grade View: Grade II Tube size: 7.5 mm Number of attempts: 1 Airway Equipment and Method: Rigid stylet and Video-laryngoscopy Placement Confirmation: ETT inserted through vocal cords under direct vision, Positive ETCO2, Breath sounds checked- equal and bilateral and CO2 detector Secured at: 23 cm Tube secured with: ETT holder      Medications Ordered in ED Medications  EPINEPHrine (ADRENALIN) 1 MG/10ML injection (1 mg Intravenous Given 03/12/2021 2022)  EPINEPHrine (ADRENALIN) 5 mg in NS 250 mL (0.02 mg/mL) premix infusion (5 mcg/min Intravenous New Bag/Given 03/01/2021 2029)  fentaNYL 2553mg in NS 2551m(1085mml) infusion-PREMIX (has no administration in time range)  fentaNYL (SUBLIMAZE) bolus via infusion 50 mcg (has no administration in time range)  fentaNYL (SUBLIMAZE) 100 MCG/2ML injection (has no administration in time range)  fentaNYL (SUBLIMAZE) injection 100 mcg (100 mcg Intravenous Given 03/10/2021 2035)    ED Course/ Medical Decision Making/ A&P                           Patient presents following cardiac arrest as  described in HPI above.  Patient is unresponsive on arrival in PE[place] compressions [place].  Pupils are dilated and fixed.  Patient was given another dose of epinephrine and ROSC was obtained after about 5 minutes of CPR.  The patient was intubated during a pulse check as described in procedure note above.  Unclear etiology of PEA arrest and broad work-up was initiated.  EKG shows sinus tachycardia with nonspecific ST changes, but no significant ST elevation or depression to suggest STEMI.  Labs and imaging reviewed by myself.  CXR with diffuse increased lung markings without evidence of focal consolidation or pneumothorax.  Patient also has seventh and ninth  left rib fractures.  Findings likely represent sequela of prolonged downtime and CPR.  Blood gas shows significant metabolic acidosis of pH 7.47 likely due to lactic acidosis in the setting of prolonged downtime.  CBC with leukocytosis, no significant anemia.  CMP with anion gap metabolic acidosis, AKI, stress hyperglycemia, and elevated LFTs.  Patient started to exhibit whole body jerking while in the emergency department for which neurology was consulted.  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  They recommended loading with Keppra and continuing Keppra 500 twice daily.  I discussed the patient with the ICU team who will admit the patient.  Final Clinical Impression(s) / ED Diagnoses Final diagnoses:  Cardiac arrest [facility])    Rx / DC Orders ED Discharge Orders     None         Kadra Kohan, Amalia Hailey, MD 03/17/2021 2356    Tegeler, Gwenyth Allegra, MD 03/03/21 1128

## 2021-03-02 NOTE — H&P (Addendum)
NAME:  Jamie Mercado, MRN:  [id number], DOB:  06-08-1960, LOS: 0 ADMISSION DATE:  03/04/2021, CONSULTATION DATE:  1/13 REFERRING MD:  Dr. Sherry Ruffing, CHIEF COMPLAINT:  post PEA arrest   History of Present Illness:  Patient is a 61 yo F w/ pertinent PMH of HLD, HTN, GAD, hypothyroidism presents to [facility] ED on 1/13 PEA arrest.  On 1/13, patient was found down for unknown amount of time in hotel room. EMS called and found patient unresponsive. About 45 minutes of CPR was performed for PEA arrest; 4 doses of epi given. Upon arrival to [facility] ED, pulse was obtained. Patient breathing tube exchanged for ETT. Started on epi drip for hypotension. Patient remains unresponsive. EKG sinus tachy; no STEMI appreciated. CT head pending. Labs pending.  PCCM consulted for icu admission.  Pertinent  Medical History   Past Medical History:  Diagnosis Date   Acute gastritis without bleeding Fall 2019   H pylori NEG on EGD   Cervical cancer ([place])    around age 78   Colon polyps    COVID-19 virus infection 07/2020   paxlovid   Essential hypertension    GAD (generalized anxiety disorder)    GERD (gastroesophageal reflux disease)    well controlled with ranitidine bid   Hay fever    Hypercholesterolemia 12/2020   LDL 156-->frmhm risk 7%-->TLC   Hypothyroidism    IBS (irritable bowel syndrome)    Insomnia    Kidney stones    Moderate persistent asthma    Not taking controller meds b/c she said she improved significantly with cutting back on tobacco use.   Psoriasis    Tobacco dependence    ongoing as of 02/2017     Significant Hospital Events: Including procedures, antibiotic start and stop dates in addition to other pertinent events   1/13: admitted to [facility]; post cardiac arrest/intubated  Interim History / Subjective:  Patient intubated on mech vent Unresponsive; cough/gag reflex present; possible seizure activity appreciated Neuro consulted; bolus keppra and on IV versed On epi drip  Objective    Blood pressure 92/64, pulse 79, temperature 97.6 F (36.4 C), temperature source Temporal, resp. rate 19, height 5\' 6"  (1.676 m), SpO2 94 %.    Vent Mode: PRVC FiO2 (%):  [40 %-100 %] 40 % Set Rate:  [20 bmp-28 bmp] 28 bmp Vt Set:  [470 mL] 470 mL PEEP:  [5 cmH20] 5 cmH20 Plateau Pressure:  [22 cmH20] 22 cmH20  No intake or output data in the 24 hours ending 03/12/2021 2108 There were no vitals filed for this visit.  Examination: General:  critically ill appearing on mech vent HEENT: MM pink/moist; ETT and OG in place Neuro: Unresponsive; cough/gag reflex present; occasional twitching, possible seizure activity CV: s1s2, no m/r/g; EKG sinus tachy w/ no STEMI appreciated PULM:  dim clear BS bilaterally; on mech vent PRVC GI: soft, bsx4 active  Extremities: warm/dry, no edema  Skin: no rashes or lesions appreciated   Resolved Hospital Problem list     Assessment & Plan:  Post cardiac arrest: found in hotel; unknown downtime Shock: post arrest P: -admit to ICU  -telemetry monitoring -will add levo and try to wean off epi drip; wean for MAP >65 -will give IV fluid bolus to help improve hypotension -check cbg -check bmp/cbc/mag -follow UDS, ethanol, salicylate level -bcx2 pending -check procalc -empiric abx started -TTM protocol in place -check UA, CXR -check CT head -stat EEG -check/trend troponin and lactate -check echo  Acute respiratory failure with  hypercarbia P: -check abg -PRVC 6-8 cc/kg -wean fio2 for sats >92% -VAP prevention in place -check CXR  Acute metabolic encephalopathy Possible seizure: likely anoxic brain injury P: -neuro consulted -CT head pending -UDS, ethanol, salicylate pending -check ammonia -keppra given; will start propofol -prn versed for seizure -consider klonopin and depakote per neuro if seizures persist -stat EEG -limit sedative meds -frequent neuro checks  Elevated LFTs: likely shock liver P: -trend  CMP  Hyperglycemia P: -check a1c -SSI and cbg monitoring  Asthma Hx of tobacco abuse P: -prn duoneb for wheezing  HTN HLD P: -hold home antihypertensives -no home statin on file  GAD P: -hold home meds  GERD P: -PPI  Hypothyroidism P: -check TSH -start home synthroid  Best Practice (right click and "Reselect all SmartList Selections" daily)   Diet/type: NPO w/ meds via tube DVT prophylaxis: SCD GI prophylaxis: PPI Lines: N/A Foley:  N/A Code Status:  full code Last date of multidisciplinary goals of care discussion [attempted to call significant other, Ander Gaster, and no answer on phone.]  Labs   CBC: Recent Labs  Lab 03/01/2021 2020 03/04/2021 2042  WBC 20.9*  --   NEUTROABS PENDING  --   HGB 14.9 14.3  HCT 46.9* 42.0  MCV 90.7  --   PLT 235  --     Basic Metabolic Panel: Recent Labs  Lab 03/14/2021 2042  NA 136  K 3.3*   GFR: CrCl cannot be calculated (Patient's most recent lab result is older than the maximum 21 days allowed.). Recent Labs  Lab 03/08/2021 2020  WBC 20.9*    Liver Function Tests: No results for input(s): AST, ALT, ALKPHOS, BILITOT, PROT, ALBUMIN in the last 168 hours. No results for input(s): LIPASE, AMYLASE in the last 168 hours. No results for input(s): AMMONIA in the last 168 hours.  ABG    Component Value Date/Time   PHART 7.068 (LL) 03/07/2021 2042   PCO2ART 50.5 (H) 02/25/2021 2042   PO2ART 308 (H) 02/25/2021 2042   HCO3 14.6 (L) 02/23/2021 2042   TCO2 16 (L) 02/18/2021 2042   ACIDBASEDEF 16.0 (H) 02/27/2021 2042   O2SAT 100.0 02/23/2021 2042     Coagulation Profile: No results for input(s): INR, PROTIME in the last 168 hours.  Cardiac Enzymes: No results for input(s): CKTOTAL, CKMB, CKMBINDEX, TROPONINI in the last 168 hours.  HbA1C: Hgb A1c MFr Bld  Date/Time Value Ref Range Status  01/04/2016 11:40 AM 5.6 <5.7 % Final    Comment:      For the purpose of screening for the presence of diabetes:    <5.7%       Consistent with the absence of diabetes 5.7-6.4 %   Consistent with increased risk for diabetes (prediabetes) >=6.5 %     Consistent with diabetes   This assay result is consistent with a decreased risk of diabetes.   Currently, no consensus exists regarding use of hemoglobin A1c for diagnosis of diabetes in children.   According to American Diabetes Association (ADA) guidelines, hemoglobin A1c <7.0% represents optimal control in non-pregnant diabetic patients. Different metrics may apply to specific patient populations. Standards of Medical Care in Diabetes (ADA).       CBG: No results for input(s): GLUCAP in the last 168 hours.  Review of Systems:   Unable to obtain. Patient intubated/sedated.  Past Medical History:  She,  has a past medical history of Acute gastritis without bleeding (Fall 2019), Cervical cancer [facility]), Colon polyps, COVID-19 virus infection (07/2020), Essential  hypertension, GAD (generalized anxiety disorder), GERD (gastroesophageal reflux disease), Hay fever, Hypercholesterolemia (12/2020), Hypothyroidism, IBS (irritable bowel syndrome), Insomnia, Kidney stones, Moderate persistent asthma, Psoriasis, and Tobacco dependence.   Surgical History:   Past Surgical History:  Procedure Laterality Date   APPENDECTOMY     COLONOSCOPY  2015   endometrosis     TAH/BSO 2011   ESOPHAGOGASTRODUODENOSCOPY  12/05/2017   lower third of esoph inflamed, +gastritis (H pylori NEG).  Duodenal bx Normal.   KNEE SURGERY     TONSILLECTOMY     TOTAL ABDOMINAL HYSTERECTOMY W/ BILATERAL SALPINGOOPHORECTOMY  2011   Endometriosis   TUBAL LIGATION       Social History:   reports that she has been smoking cigarettes. She has been smoking an average of .5 packs per day. She has never used smokeless tobacco. She reports current alcohol use. She reports that she does not use drugs.   Family History:  Her family history includes Arthritis in her maternal grandmother;  Cancer in her paternal grandmother; Colon polyps in her paternal grandmother; Diabetes in her father and maternal grandmother; Early death in her father; Heart disease in her father; Hyperlipidemia in her father, maternal grandmother, and mother; Hypertension in her father, maternal grandfather, maternal grandmother, mother, and paternal grandfather; Kidney disease in her maternal grandmother; Stroke in her maternal grandmother.   Allergies Allergies  Allergen Reactions   Codeine Itching, Nausea And Vomiting and Other (See Comments)    Makes her feel wierd     Home Medications  Prior to Admission medications   Medication Sig Start Date End Date Taking? Authorizing Provider  acetaminophen (TYLENOL) 500 MG tablet Take 1,000 mg by mouth every 8 (eight) hours as needed for mild pain, moderate pain or headache.    [provider]  albuterol (VENTOLIN HFA) 108 (90 Base) MCG/ACT inhaler Inhale 1-2 puffs into the lungs every 6 (six) hours as needed for wheezing or shortness of breath. 11/23/20   Long, Wonda Olds, MD  amLODipine (NORVASC) 5 MG tablet Take 1 tablet (5 mg total) by mouth daily. 12/29/20   McGowen, Adrian Blackwater, MD  ipratropium-albuterol (DUONEB) 0.5-2.5 (3) MG/3ML SOLN Take 3 mLs by nebulization every 6 (six) hours as needed. Patient not taking: No sig reported 08/11/20   Tammi Sou, MD  levothyroxine (SYNTHROID) 100 MCG tablet 1 tab po qd x 6d per week and 1.5 tabs one day a week 12/13/20   McGowen, Adrian Blackwater, MD  LORazepam (ATIVAN) 0.5 MG tablet Take 1 tablet (0.5 mg total) by mouth every 8 (eight) hours as needed for anxiety. 12/29/20   McGowen, Adrian Blackwater, MD  mirtazapine (REMERON) 7.5 MG tablet Take 1 tablet (7.5 mg total) by mouth at bedtime. 12/29/20   McGowen, Adrian Blackwater, MD     Critical care time: 45 minutes    JD Rexene Agent [facility] 03/19/2021, 9:08 PM  Please see [email or web address] for pager details.  From 7A-7P if no response, please call  [phone]. After hours, please call ELink [phone].

## 2021-03-02 NOTE — Progress Notes (Signed)
RT NOTE:  Pt transported to CT and back to [facility] without event.

## 2021-03-02 NOTE — ED Notes (Signed)
Pt taken to CT.

## 2021-03-02 NOTE — ED Notes (Signed)
Pt had episode of large, watery stool, spilling onto floor. Rectal tube and temp foley placed at this time.

## 2021-03-02 NOTE — ED Triage Notes (Signed)
Pt in as CPR in progress via [facility], found down in hotel room by staff, pulseless and unresponsive. EMS reports unknown downtime, and CPR start at [place] (total 45 min w/intermittent ROSC en route). Initial rhythm PEA with EMS, given 4 Epi's enroute. King airway in place, CBG 162. No pulses on ED arrival, CPR continued.

## 2021-03-02 NOTE — Consult Note (Signed)
Neurology Consultation  Reason for Consult: Seizure-like activity postcardiac arrest Referring Physician: Dr. Harrell Gave Tegeler  CC: Seizure-like activity postcardiac arrest  History is obtained from: Chart review  HPI: Jamie Mercado is a 61 y.o. female past medical history of hypertension, generalized anxiety disorder, hyperlipidemia, asthma, tobacco dependence, hypothyroidism, was brought down to the ER after she was found down in a hotel room-unknown amount of time of unresponsiveness.  About 45 minutes of CPR was performed for PEA arrest that was noted upon EMS arrival.  4 doses of epi given.  In the ER, ROSC was obtained in 5 minutes for a total of about 50 minutes of downtime minimum.  She was started on epinephrine drip for hypotension.  Remained unresponsive and GCS 3 in ER-emergently intubated.  Started exhibiting whole body jerking for which neurology was consulted.  No family/patient attendant at bedside to provide any further history.    LKW: Unknown tpa given?: no, unknown downtime-likely anoxic brain injury Premorbid modified Rankin scale (mRS): Unknown  ROS: Unable to obtain due to altered mental status.   Past Medical History:  Diagnosis Date   Acute gastritis without bleeding Fall 2019   H pylori NEG on EGD   Cervical cancer ([place])    around age 21   Colon polyps    COVID-19 virus infection 07/2020   paxlovid   Essential hypertension    GAD (generalized anxiety disorder)    GERD (gastroesophageal reflux disease)    well controlled with ranitidine bid   Hay fever    Hypercholesterolemia 12/2020   LDL 156-->frmhm risk 7%-->TLC   Hypothyroidism    IBS (irritable bowel syndrome)    Insomnia    Kidney stones    Moderate persistent asthma    Not taking controller meds b/c she said she improved significantly with cutting back on tobacco use.   Psoriasis    Tobacco dependence    ongoing as of 02/2017   Family History  Problem Relation Age of Onset   Diabetes  Father    Heart disease Father    Hypertension Father    Early death Father    Hyperlipidemia Father    Cancer Paternal Grandmother    Colon polyps Paternal Grandmother    Hyperlipidemia Mother    Hypertension Mother    Arthritis Maternal Grandmother    Diabetes Maternal Grandmother    Hypertension Maternal Grandmother    Hyperlipidemia Maternal Grandmother    Kidney disease Maternal Grandmother    Stroke Maternal Grandmother    Hypertension Maternal Grandfather    Hypertension Paternal Grandfather    Social History:   reports that she has been smoking cigarettes. She has been smoking an average of .5 packs per day. She has never used smokeless tobacco. She reports current alcohol use. She reports that she does not use drugs.  Medications  Current Facility-Administered Medications:    EPINEPHrine (ADRENALIN) 5 mg in NS 250 mL (0.02 mg/mL) premix infusion, 0.5-20 mcg/min, Intravenous, Titrated, Tegeler, Gwenyth Allegra, MD, Last Rate: 15 mL/hr at 03/10/2021 2029, 5 mcg/min at 03/04/2021 2029   fentaNYL (SUBLIMAZE) 100 MCG/2ML injection, , , ,    fentaNYL (SUBLIMAZE) bolus via infusion 50 mcg, 50 mcg, Intravenous, Q1H PRN, Tegeler, Gwenyth Allegra, MD   fentaNYL 2550mcg in NS 279mL (51mcg/ml) infusion-PREMIX, 25-400 mcg/hr, Intravenous, Continuous, Tegeler, Gwenyth Allegra, MD, Last Rate: 2.5 mL/hr at 02/26/2021 2140, 25 mcg/hr at 03/20/2021 2140   ipratropium-albuterol (DUONEB) 0.5-2.5 (3) MG/3ML nebulizer solution 3 mL, 3 mL, Nebulization, Q4H PRN, Andres Labrum  D, PA-C   levETIRAcetam (KEPPRA) IVPB 1500 mg/ 100 mL premix, 1,500 mg, Intravenous, Q15 min, Tegeler, Gwenyth Allegra, MD, Last Rate: 400 mL/hr at 02/23/2021 2152, 1,500 mg at 03/16/2021 2152   midazolam (VERSED) 100 mg/100 mL (1 mg/mL) premix infusion, 0-10 mg/hr, Intravenous, Continuous, Tegeler, Gwenyth Allegra, MD, Paused at 03/19/2021 2139   midazolam (VERSED) 2 MG/2ML injection, , , ,    midazolam (VERSED) bolus via infusion 1-2 mg, 1-2 mg,  Intravenous, Q2H PRN, Tegeler, Gwenyth Allegra, MD  Current Outpatient Medications:    acetaminophen (TYLENOL) 500 MG tablet, Take 1,000 mg by mouth every 8 (eight) hours as needed for mild pain, moderate pain or headache., Disp: , Rfl:    albuterol (VENTOLIN HFA) 108 (90 Base) MCG/ACT inhaler, Inhale 1-2 puffs into the lungs every 6 (six) hours as needed for wheezing or shortness of breath., Disp: 6.7 g, Rfl: 0   amLODipine (NORVASC) 5 MG tablet, Take 1 tablet (5 mg total) by mouth daily., Disp: 90 tablet, Rfl: 3   ipratropium-albuterol (DUONEB) 0.5-2.5 (3) MG/3ML SOLN, Take 3 mLs by nebulization every 6 (six) hours as needed. (Patient not taking: No sig reported), Disp: 75 mL, Rfl: 0   levothyroxine (SYNTHROID) 100 MCG tablet, 1 tab po qd x 6d per week and 1.5 tabs one day a week, Disp: 96 tablet, Rfl: 0   LORazepam (ATIVAN) 0.5 MG tablet, Take 1 tablet (0.5 mg total) by mouth every 8 (eight) hours as needed for anxiety., Disp: 60 tablet, Rfl: 5   mirtazapine (REMERON) 7.5 MG tablet, Take 1 tablet (7.5 mg total) by mouth at bedtime., Disp: 30 tablet, Rfl: 1  Exam: Current vital signs: BP 95/66    Pulse 77    Temp 97.6 F (36.4 C) (Temporal)    Resp (!) 28    Ht 5\' 6"  (1.676 m)    Wt 88.1 kg    SpO2 97%    BMI 31.35 kg/m  Vital signs in last 24 hours: Temp:  [97.6 F (36.4 C)] 97.6 F (36.4 C) (01/13 2049) Pulse Rate:  [77-97] 77 (01/13 2200) Resp:  [17-28] 28 (01/13 2200) BP: (88-134)/(54-101) 95/66 (01/13 2200) SpO2:  [85 %-100 %] 97 % (01/13 2200) FiO2 (%):  [40 %-100 %] 40 % (01/13 2052) Weight:  [88.1 kg] 88.1 kg (01/13 2203) General: Intubated, on fentanyl-no other sedation. HEENT: Normocephalic/atraumatic CVs: Regular rate rhythm-hypotensive Respiratory: Vented Extremities warm well perfused Neurological exam Intubated on some sedation with fentanyl Intermittent whole-body jerks with eye opening-myoclonic jerking Cranials: Pupils are equal sluggishly reactive to light, does  not blink to threat from either side, facial symmetry difficult to ascertain due to endotracheal tube, no gaze preference or deviation. Motor exam: On any stimulation, she has whole-body myoclonic jerking-no withdrawal or spontaneous movement noticed Since exam: As above Coordination cannot be tested given her mentation  Labs I have reviewed labs in epic and the results pertinent to this consultation are:  CBC    Component Value Date/Time   WBC 20.9 (H) 03/20/2021 2020   RBC 5.17 (H) 02/21/2021 2020   HGB 14.3 03/01/2021 2042   HCT 42.0 03/03/2021 2042   PLT 235 03/12/2021 2020   MCV 90.7 02/22/2021 2020   MCH 28.8 02/27/2021 2020   MCHC 31.8 03/12/2021 2020   RDW 13.4 03/06/2021 2020   LYMPHSABS 9.6 (H) 03/18/2021 2020   MONOABS 0.8 03/12/2021 2020   EOSABS 0.8 (H) 03/13/2021 2020   BASOSABS 0.0 02/25/2021 2020    CMP  Component Value Date/Time   NA 136 03/20/2021 2042   K 3.3 (L) 03/01/2021 2042   CL 103 02/25/2021 2020   CO2 17 (L) 03/04/2021 2020   GLUCOSE 404 (H) 03/11/2021 2020   BUN 10 03/15/2021 2020   CREATININE 1.03 (H) 03/03/2021 2020   CREATININE 0.84 05/05/2018 1615   CALCIUM 8.6 (L) 03/19/2021 2020   PROT 6.4 (L) 03/19/2021 2020   ALBUMIN 3.7 03/14/2021 2020   AST 295 (H) 02/19/2021 2020   ALT 309 (H) 02/25/2021 2020   ALKPHOS 99 03/06/2021 2020   BILITOT 0.6 02/20/2021 2020   GFRNONAA >60 02/23/2021 2020   GFRNONAA 72 07/23/2016 1600   GFRAA >60 11/15/2019 0822   GFRAA 83 07/23/2016 1600  Urine toxicology screen pending Prior toxicology screens suggestive of marijuana, cocaine in 2008, lorazepam in November 2022.   Serum alcohol level elevated at 82 today.  Glucose 404 AST and ALT 295 and 309 respectively  Imaging I have reviewed the images obtained:  CT-head-formal reading unremarkable.  On my review, there is symmetric effacement of sulci with some loss of gray-white differentiation in the parietotemporal regions bilaterally.  The frontal  areas bilaterally look more normal in appearance.  Assessment:  61 year old with above past medical history brought in after being found down in a hotel room of unknown duration.  CPR in the field and in the ER for total about 50 minutes duration prior to ROSC.  Right after ROSC, started having what appears to be myoclonic jerking. Suspect underlying severe anoxic brain injury given myoclonic jerking present on presentation which also portends a poor prognosis. Given a load of Keppra and Versed in the ER.   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 -Will discuss with the ICU team on the appropriate sedation. -Stat EEG/continuous EEG-technologist has been notified -Exam appears very poor and suggestive of anoxic brain injury at the time of presentation.  On top of that, myoclonus at presentation after cardiac arrest usually portends extremely poor prognosis in terms of neurologically meaningful recovery. -We will continue to follow the patient with you and follow serial exams to see if anything changes but at this time her condition is extremely critical.  Plan relayed to Andres Labrum, PA-C from the PCCM team via secure chat  -- Amie Portland, MD Neurologist [facility] Pager: [place] Performed by: Amie Portland, MD Total critical care time: 45 minutes Critical care time was exclusive of separately billable procedures and treating other patients and/or supervising APPs/Residents/Students Critical care was necessary to treat or prevent imminent or life-threatening  deterioration due to post cardiac arrest myoclonus likely due to anoxic brain injury  This patient is critically ill and at significant risk for neurological worsening and/or death and care requires constant monitoring. Critical care was time spent personally by me on the following activities: development of treatment plan with patient and/or surrogate as well as nursing, discussions with consultants, evaluation of patient's response to treatment, examination of patient, obtaining history from patient or surrogate, ordering and performing treatments and interventions, ordering and review of laboratory studies, ordering and review of radiographic studies, pulse oximetry, re-evaluation of patient's condition, participation in multidisciplinary rounds and medical  decision making of high complexity in the care of this patient.

## 2021-03-03 ENCOUNTER — Inpatient Hospital Stay (HOSPITAL_COMMUNITY): Payer: 59

## 2021-03-03 DIAGNOSIS — I469 Cardiac arrest, cause unspecified: Secondary | ICD-10-CM | POA: Diagnosis not present

## 2021-03-03 DIAGNOSIS — G931 Anoxic brain damage, not elsewhere classified: Secondary | ICD-10-CM | POA: Diagnosis not present

## 2021-03-03 DIAGNOSIS — R569 Unspecified convulsions: Secondary | ICD-10-CM

## 2021-03-03 LAB — POCT I-STAT 7, (LYTES, BLD GAS, ICA,H+H)
Acid-base deficit: 10 mmol/L — ABNORMAL HIGH (ref 0.0–2.0)
Bicarbonate: 18.3 mmol/L — ABNORMAL LOW (ref 20.0–28.0)
Calcium, Ion: 1.13 mmol/L — ABNORMAL LOW (ref 1.15–1.40)
HCT: 39 % (ref 36.0–46.0)
Hemoglobin: 13.3 g/dL (ref 12.0–15.0)
O2 Saturation: 99 %
Patient temperature: 34.4
Potassium: 3.3 mmol/L — ABNORMAL LOW (ref 3.5–5.1)
Sodium: 141 mmol/L (ref 135–145)
TCO2: 20 mmol/L — ABNORMAL LOW (ref 22–32)
pCO2 arterial: 43.1 mmHg (ref 32.0–48.0)
pH, Arterial: 7.221 — ABNORMAL LOW (ref 7.350–7.450)
pO2, Arterial: 146 mmHg — ABNORMAL HIGH (ref 83.0–108.0)

## 2021-03-03 LAB — URINALYSIS, ROUTINE W REFLEX MICROSCOPIC
Bilirubin Urine: NEGATIVE
Glucose, UA: >=500 mg/dL — AB
Ketones, ur: NEGATIVE mg/dL
Leukocytes,Ua: NEGATIVE
Nitrite: NEGATIVE
Protein, ur: 30 mg/dL — AB
Specific Gravity, Urine: 1.015 (ref 1.005–1.030)
pH: 6 (ref 5.0–8.0)

## 2021-03-03 LAB — GLUCOSE, CAPILLARY
Glucose-Capillary: 231 mg/dL — ABNORMAL HIGH (ref 70–99)
Glucose-Capillary: 220 mg/dL — ABNORMAL HIGH (ref 70–99)
Glucose-Capillary: 118 mg/dL — ABNORMAL HIGH (ref 70–99)
Glucose-Capillary: 101 mg/dL — ABNORMAL HIGH (ref 70–99)
Glucose-Capillary: 80 mg/dL (ref 70–99)
Glucose-Capillary: 84 mg/dL (ref 70–99)
Glucose-Capillary: 104 mg/dL — ABNORMAL HIGH (ref 70–99)

## 2021-03-03 LAB — T4, FREE: Free T4: 0.73 ng/dL (ref 0.61–1.12)

## 2021-03-03 LAB — CBC
HCT: 41.7 % (ref 36.0–46.0)
HCT: 42.7 % (ref 36.0–46.0)
Hemoglobin: 13.2 g/dL (ref 12.0–15.0)
Hemoglobin: 13.6 g/dL (ref 12.0–15.0)
MCH: 28.3 pg (ref 26.0–34.0)
MCH: 28.3 pg (ref 26.0–34.0)
MCHC: 31.7 g/dL (ref 30.0–36.0)
MCHC: 31.9 g/dL (ref 30.0–36.0)
MCV: 88.8 fL (ref 80.0–100.0)
MCV: 89.3 fL (ref 80.0–100.0)
Platelets: 302 10*3/uL (ref 150–400)
Platelets: 307 10*3/uL (ref 150–400)
RBC: 4.67 MIL/uL (ref 3.87–5.11)
RBC: 4.81 MIL/uL (ref 3.87–5.11)
RDW: 13.5 % (ref 11.5–15.5)
RDW: 13.7 % (ref 11.5–15.5)
WBC: 22.2 10*3/uL — ABNORMAL HIGH (ref 4.0–10.5)
WBC: 24 10*3/uL — ABNORMAL HIGH (ref 4.0–10.5)
nRBC: 0 % (ref 0.0–0.2)
nRBC: 0 % (ref 0.0–0.2)

## 2021-03-03 LAB — RAPID URINE DRUG SCREEN, HOSP PERFORMED
Amphetamines: NOT DETECTED
Barbiturates: NOT DETECTED
Benzodiazepines: POSITIVE — AB
Cocaine: POSITIVE — AB
Opiates: NOT DETECTED
Tetrahydrocannabinol: NOT DETECTED

## 2021-03-03 LAB — COMPREHENSIVE METABOLIC PANEL
ALT: UNDETERMINED U/L (ref 0–44)
AST: UNDETERMINED U/L (ref 15–41)
Albumin: 3 g/dL — ABNORMAL LOW (ref 3.5–5.0)
Alkaline Phosphatase: 74 U/L (ref 38–126)
Anion gap: 16 — ABNORMAL HIGH (ref 5–15)
BUN: 11 mg/dL (ref 6–20)
CO2: 17 mmol/L — ABNORMAL LOW (ref 22–32)
Calcium: 7.6 mg/dL — ABNORMAL LOW (ref 8.9–10.3)
Chloride: 110 mmol/L (ref 98–111)
Creatinine, Ser: 1.01 mg/dL — ABNORMAL HIGH (ref 0.44–1.00)
GFR, Estimated: >60 mL/min (ref >60)
Glucose, Bld: 259 mg/dL — ABNORMAL HIGH (ref 70–99)
Potassium: 3.6 mmol/L (ref 3.5–5.1)
Sodium: 143 mmol/L (ref 135–145)
Total Bilirubin: UNDETERMINED mg/dL (ref 0.3–1.2)
Total Protein: 5.4 g/dL — ABNORMAL LOW (ref 6.5–8.1)

## 2021-03-03 LAB — PROCALCITONIN: Procalcitonin: 0.56 ng/mL

## 2021-03-03 LAB — URINALYSIS, MICROSCOPIC (REFLEX)

## 2021-03-03 LAB — TROPONIN I (HIGH SENSITIVITY): Troponin I (High Sensitivity): 2402 ng/L (ref <18)

## 2021-03-03 LAB — AST: AST: 494 U/L — ABNORMAL HIGH (ref 15–41)

## 2021-03-03 LAB — HEMOGLOBIN A1C
Hgb A1c MFr Bld: 6 % — ABNORMAL HIGH (ref 4.8–5.6)
Mean Plasma Glucose: 125.5 mg/dL

## 2021-03-03 LAB — BILIRUBIN, TOTAL: Total Bilirubin: 0.5 mg/dL (ref 0.3–1.2)

## 2021-03-03 LAB — LACTIC ACID, PLASMA: Lactic Acid, Venous: 5.2 mmol/L (ref 0.5–1.9)

## 2021-03-03 LAB — CREATININE, SERUM
Creatinine, Ser: 1.06 mg/dL — ABNORMAL HIGH (ref 0.44–1.00)
GFR, Estimated: >60 mL/min (ref >60)

## 2021-03-03 LAB — TRIGLYCERIDES: Triglycerides: 170 mg/dL — ABNORMAL HIGH (ref <150)

## 2021-03-03 LAB — MRSA NEXT GEN BY PCR, NASAL: MRSA by PCR Next Gen: DETECTED — AB

## 2021-03-03 LAB — ALT: ALT: 370 U/L — ABNORMAL HIGH (ref 0–44)

## 2021-03-03 IMAGING — CT CT CERVICAL SPINE W/O CM
5 series · 16 of 35 positions shown, 18 images · non-contrast
Comparison: None.

CLINICAL DATA: History of recent cardiac arrest with fall and neck
pain, initial encounter



[Series 4: c_spine 2.0 st · axial · 0.50mm/px · z∈[-159,-111]mm · 2 of 73 slices shown]
[im 25/73  bone]
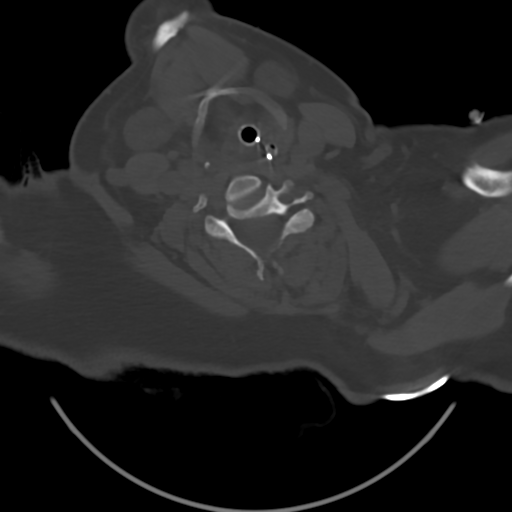
[im 49/73  bone]
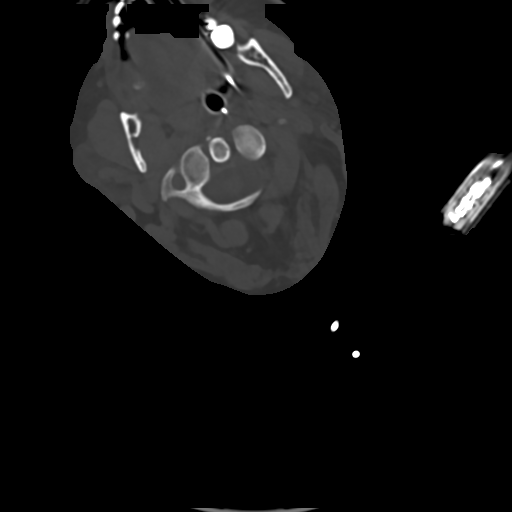

[Series 6: c_spine 2.0 orthogonals · axial · 0.21mm/px · z∈[-211,-143]mm · 3 of 81 slices shown]
[im 21/81  bone]
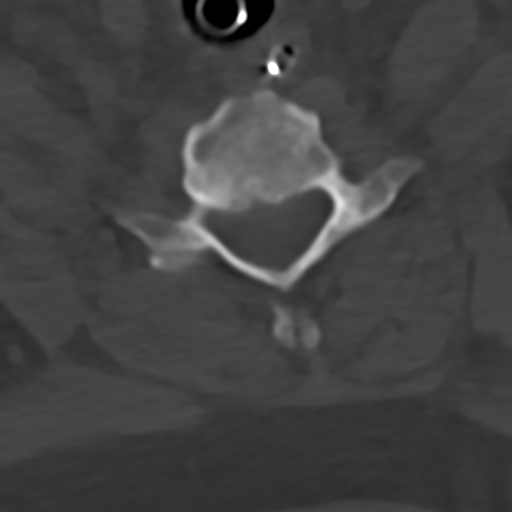
[im 41/81  bone]
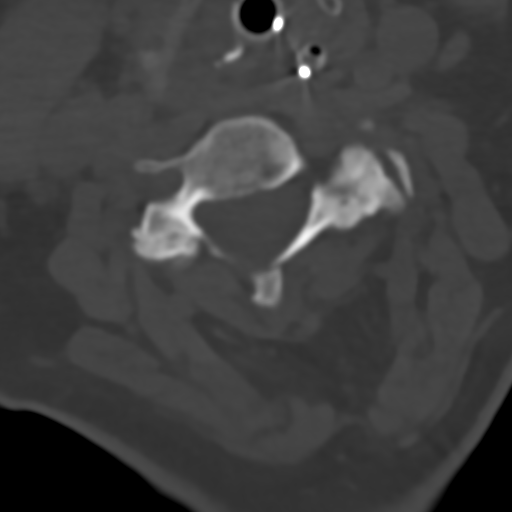
[im 61/81  bone]
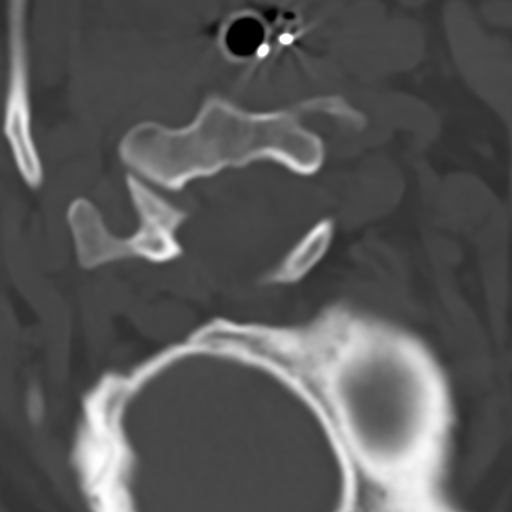

[Series 10: c_spine 2.0 sag bone · sagittal · 0.30mm/px · 5 of 75 slices shown, 6 images]
[im 25/75  bone]
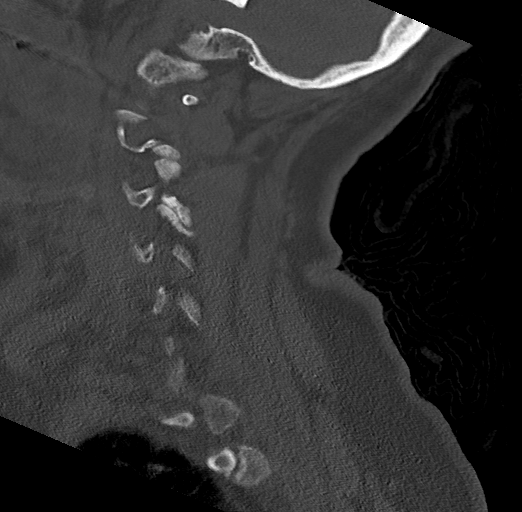
[im 31/75  bone]
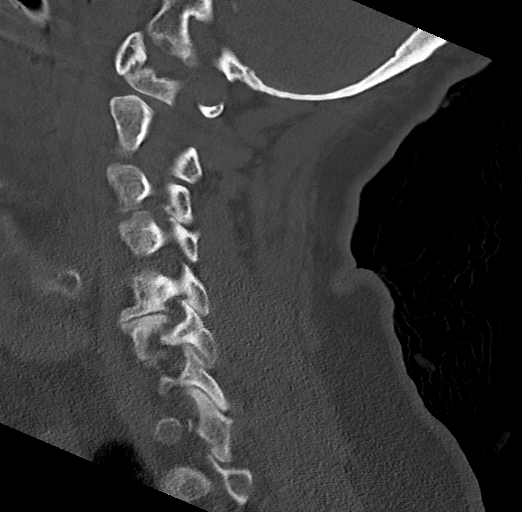
[im 38/75  soft-tissue]
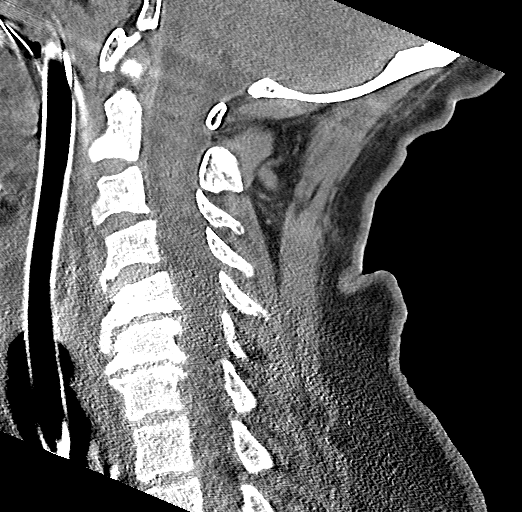
[im 38/75  bone]
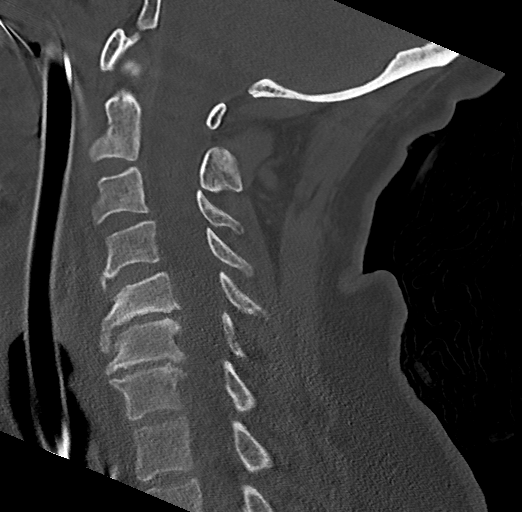
[im 44/75  bone]
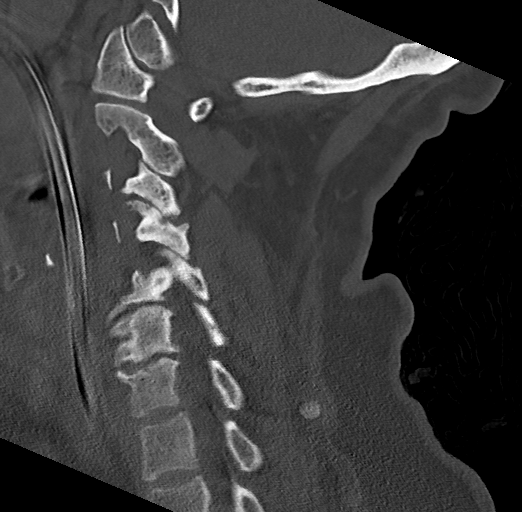
[im 50/75  bone]
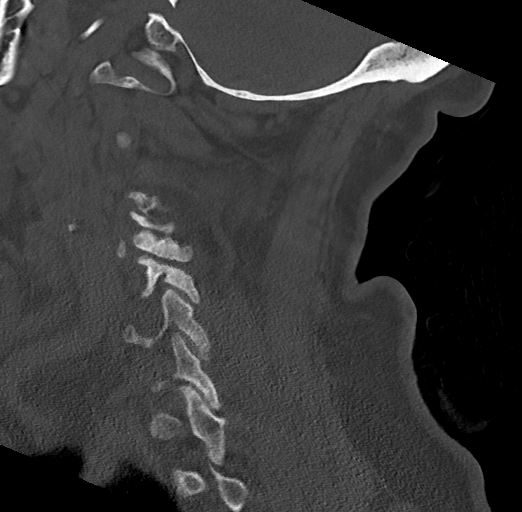

[Series 11: c_spine 2.0 cor bone · coronal · 0.31mm/px · 3 of 61 slices shown]
[im 13/61  bone]
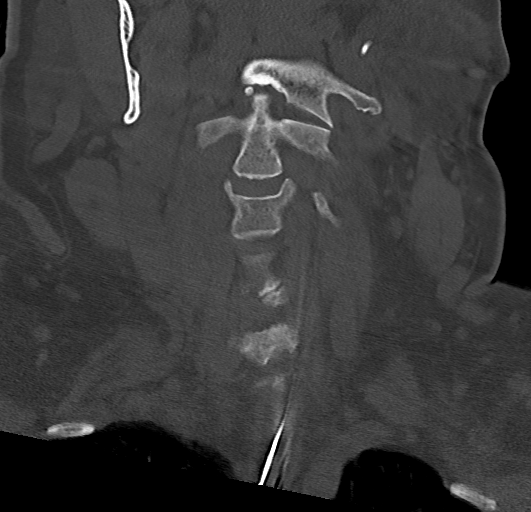
[im 25/61  bone]
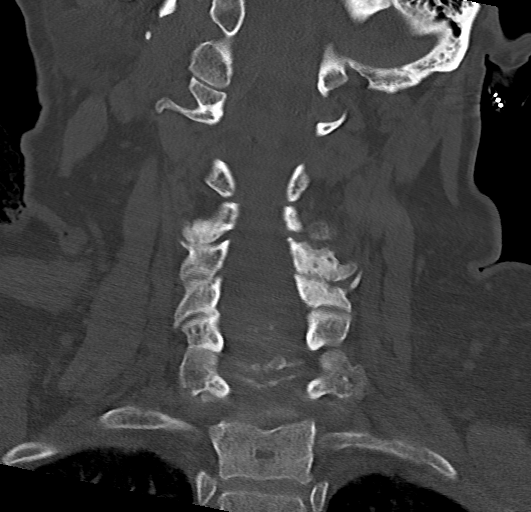
[im 37/61  bone]
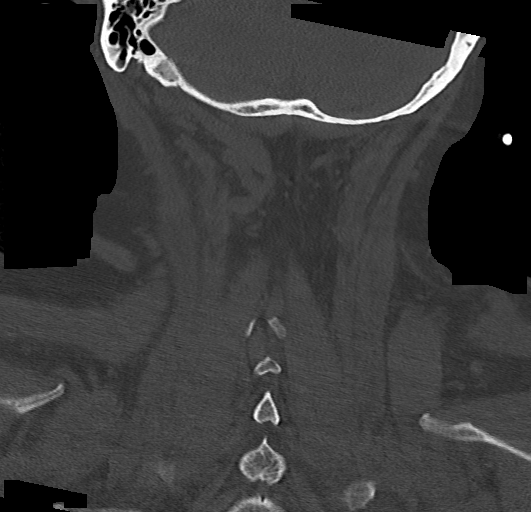

[Series 12: axial bone · axial · 0.27mm/px · z∈[-221,-148]mm · 3 of 82 slices shown, 4 images]
[im 21/82  soft-tissue]
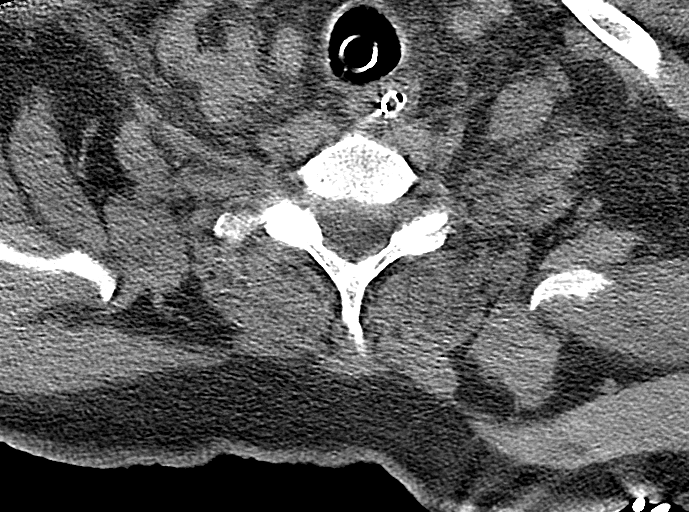
[im 21/82  bone]
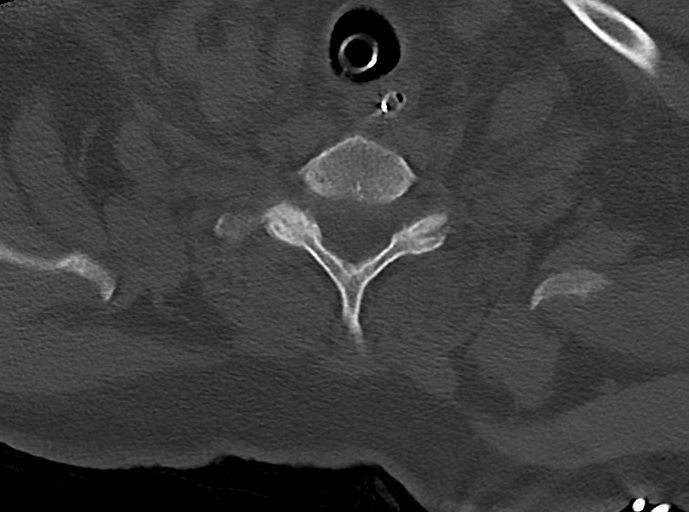
[im 41/82  bone]
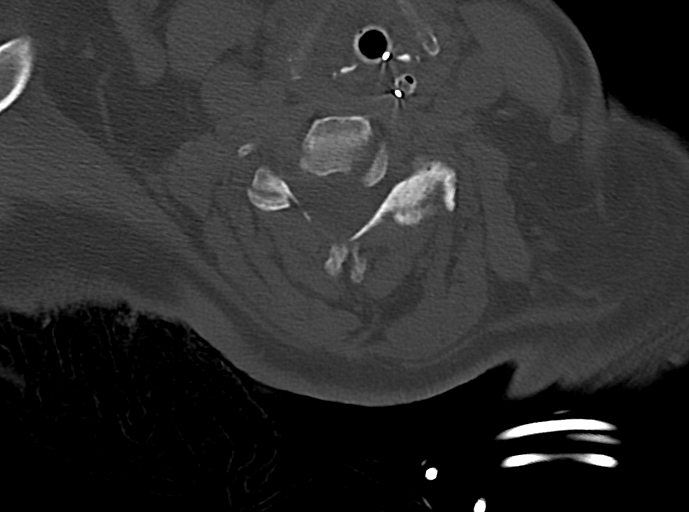
[im 61/82  bone]
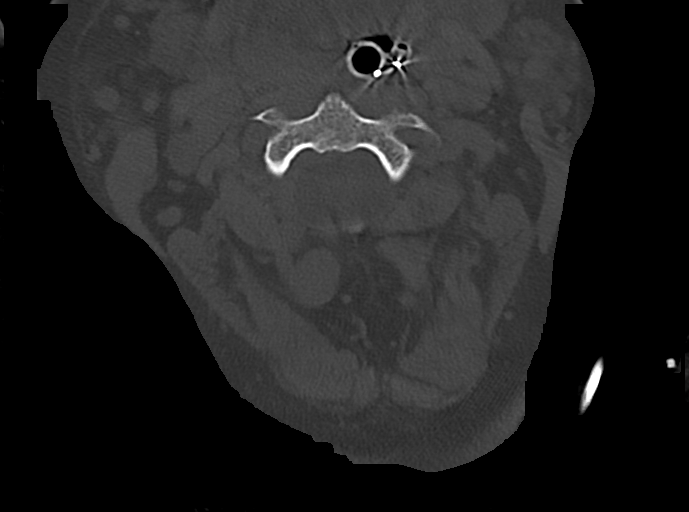

[16 of 35 positions shown; findings below may reference images not displayed]

FINDINGS: Alignment: Within normal limits.

Skull base and vertebrae: 7 cervical segments are well visualized.
Vertebral body height is well maintained. Multilevel facet
hypertrophic changes are noted. Disc space narrowing is noted at
C5-6 and C6-7 with associated osteophytic changes. No acute fracture
or acute facet abnormality is noted. The odontoid is within normal
limits.

Soft tissues and spinal canal: Surrounding soft tissue structures
are within normal limits. No focal hematoma is noted. Endotracheal
tube and nasogastric catheter are seen.

Upper chest: Visualized lung apices are within normal limits.

Other: None
IMPRESSION: Degenerative changes as described without acute abnormality.

## 2021-03-03 MED ORDER — VANCOMYCIN HCL 1500 MG/300ML IV SOLN
1500.0000 mg | INTRAVENOUS | Status: DC
  Administered 2021-03-04: 1500 mg via INTRAVENOUS
  Filled 2021-03-03 (×3): qty 300

## 2021-03-03 MED ORDER — LEVETIRACETAM IN NACL 1000 MG/100ML IV SOLN
1000.0000 mg | INTRAVENOUS | Status: AC
  Administered 2021-03-03: 1000 mg via INTRAVENOUS
  Filled 2021-03-03: qty 100

## 2021-03-03 MED ORDER — VALPROATE SODIUM 100 MG/ML IV SOLN
1500.0000 mg | Freq: Once | INTRAVENOUS | Status: AC
  Administered 2021-03-03: 1500 mg via INTRAVENOUS
  Filled 2021-03-03: qty 15

## 2021-03-03 MED ORDER — LEVETIRACETAM 100 MG/ML PO SOLN
1500.0000 mg | Freq: Two times a day (BID) | ORAL | Status: DC
  Administered 2021-03-03 – 2021-03-04 (×3): 1500 mg
  Filled 2021-03-03 (×4): qty 15

## 2021-03-03 MED ORDER — VALPROIC ACID 250 MG/5ML PO SOLN
500.0000 mg | Freq: Two times a day (BID) | ORAL | Status: DC
  Administered 2021-03-03 – 2021-03-04 (×3): 500 mg
  Filled 2021-03-03 (×3): qty 10

## 2021-03-03 MED ORDER — LEVETIRACETAM 100 MG/ML PO SOLN
1000.0000 mg | Freq: Two times a day (BID) | ORAL | Status: DC
  Filled 2021-03-03: qty 10

## 2021-03-03 MED ORDER — VANCOMYCIN HCL IN DEXTROSE 1-5 GM/200ML-% IV SOLN
1000.0000 mg | INTRAVENOUS | Status: DC
  Administered 2021-03-03: 1000 mg via INTRAVENOUS
  Filled 2021-03-03: qty 200

## 2021-03-03 MED ORDER — PROPOFOL 1000 MG/100ML IV EMUL
20.0000 ug/kg/min | INTRAVENOUS | Status: DC
  Administered 2021-03-03 – 2021-03-04 (×3): 20 ug/kg/min via INTRAVENOUS
  Filled 2021-03-03 (×3): qty 100

## 2021-03-03 MED ORDER — POTASSIUM CHLORIDE 20 MEQ PO PACK
20.0000 meq | PACK | Freq: Once | ORAL | Status: AC
  Administered 2021-03-03: 20 meq

## 2021-03-03 MED ORDER — MUPIROCIN 2 % EX OINT
1.0000 "application " | TOPICAL_OINTMENT | Freq: Two times a day (BID) | CUTANEOUS | Status: DC
  Administered 2021-03-03 – 2021-03-04 (×4): 1 via NASAL
  Filled 2021-03-03: qty 22

## 2021-03-03 NOTE — Progress Notes (Signed)
EEG complete - results pending 

## 2021-03-03 NOTE — Progress Notes (Signed)
LTM EEG hooked up completed and study is running - no initial skin breakdown - push button tested - neuro notified. [facility] monitoring.

## 2021-03-03 NOTE — Progress Notes (Signed)
RT NOTE:  Pt transported to 3M05 without event. Report given to [facility], RRT.

## 2021-03-03 NOTE — Procedures (Signed)
Patient Name: Jamie Mercado  MRN: [id number]  Epilepsy Attending: Lora Havens  Referring Physician/Provider: Amie Portland, MD Date: 03/03/2021 Duration: 24.28 mins  Patient history: 61 year old with above past medical history brought in after being found down in a hotel room of unknown duration.  CPR in the field and in the ER for total about 50 minutes duration prior to ROSC.  Right after ROSC, started having what appears to be myoclonic jerking. EEG to evaluate for seizure  Level of alertness:  comatose  AEDs during EEG study: LEV  Technical aspects: This EEG study was done with scalp electrodes positioned according to the 10-20 International system of electrode placement. Electrical activity was acquired at a sampling rate of 500Hz  and reviewed with a high frequency filter of 70Hz  and a low frequency filter of 1Hz . EEG data were recorded continuously and digitally stored.   Description: Patient was noted to have episodes of spontaneous brief eye opening every 2 to 40 seconds . Concomitant eeg showed generalized polyspikes consistent with myoclonic seizure. In between myoclonic seizure, EEG showed continuous generalized background suppression. Hyperventilation and photic stimulation were not performed.     ABNORMALITY - Myoclonic seizure, generalized - EEG suppression, generalized   IMPRESSION: This study showed episodes of brief spontaneous eye opening consistent with myoclonic seizure every 2 to 40 seconds. There is also profound diffuse encephalopathy. 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  Dr Rory Percy was notified.   [name] [name]

## 2021-03-03 NOTE — Progress Notes (Signed)
Pharmacy Antibiotic Note  Jamie Mercado is a 61 y.o. female admitted on 03/08/2021 with sepsis - found down.  Pharmacy has been consulted for vancomycin dosing.  Plan: Adjust vancomycin to 1500mg  IV q24h (eAUC 540 using Scr 1.01) Monitor renal function, cultures, and clinical progression  Vancomycin levels as appropriate   Height: 5\' 6"  (167.6 cm) Weight: 75.6 kg (166 lb 10.7 oz) IBW/kg (Calculated) : 59.3  Temp (24hrs), Avg:94.4 F (34.7 C), Min:92.8 F (33.8 C), Max:97.6 F (36.4 C)  Recent Labs  Lab 03/01/2021 2020 03/13/2021 2222 02/24/2021 2341 03/03/21 0051 03/03/21 0102  WBC 20.9*  --  22.2* 24.0*  --   CREATININE 1.03*  --  1.06* 1.01*  --   LATICACIDVEN >9.0* 7.6*  --   --  5.2*    Estimated Creatinine Clearance: 62.3 mL/min (A) (by C-G formula based on SCr of 1.01 mg/dL (H)).    Allergies  Allergen Reactions   Codeine Itching, Nausea And Vomiting and Other (See Comments)    Makes her feel wierd    Antimicrobials this admission: Vancomycin 1/14 >>  Ceftriaxone 1/14 >>  Dose adjustments this admission:  Microbiology results: MRSA PCR positive  1/13 TA: no orgs 1/14 bcx:   Thank you for allowing pharmacy to be a part of this patients care. Cristela Felt, PharmD, BCPS Clinical Pharmacist 03/03/2021 12:23 PM

## 2021-03-03 NOTE — Progress Notes (Signed)
LTM maintain at bedside. No skin breakdown noted. Results pending.

## 2021-03-03 NOTE — Progress Notes (Signed)
LTM maint complete - no skin breakdown  [facility]iced [facility] monitored, Event button test confirmed by [facility].

## 2021-03-03 NOTE — Progress Notes (Signed)
eLink Physician-Brief Progress Note Patient Name: Jamie Mercado DOB: 05-10-1960 MRN: [id number]   Date of Service  03/03/2021  HPI/Events of Note  Patient transported from a local hotel with out of hospital cardiac arrest, unknown downtime, 45 minutes of resuscitation in the field and another 5  minutes in the ED, post ROSC coma with serious concern for severe anoxic brain injury.  eICU Interventions  New Patient Evaluation.        Jamie Mercado 03/03/2021, 12:00 AM

## 2021-03-03 NOTE — Progress Notes (Signed)
[facility] Progress Note Patient Name: Jamie Mercado DOB: 27-Jul-1960 MRN: [id number]   Date of Service  03/03/2021  HPI/Events of Note  Family concerned about neck injury after speaking with hotel employees about how the patient was found.   eICU Interventions  Plan: Place and maintain hard collar. CT Scan of cervical spine w/o contrast STAT.      Intervention Category Major Interventions: Other:  Lysle Dingwall 03/03/2021, 11:17 PM

## 2021-03-03 NOTE — Progress Notes (Signed)
NAME:  Jamie Mercado, MRN:  [id number], DOB:  06/10/1960, LOS: 1 ADMISSION DATE:  03/14/2021, CONSULTATION DATE:  1/13 REFERRING MD:  Dr. Sherry Ruffing, CHIEF COMPLAINT:  post PEA arrest   History of Present Illness:  Patient is a 61 yo F w/ pertinent PMH of HLD, HTN, GAD, hypothyroidism presents to [facility] ED on 1/13 PEA arrest.  On 1/13, patient was found down for unknown amount of time in hotel room. EMS called and found patient unresponsive. About 45 minutes of CPR was performed for PEA arrest; 4 doses of epi given. Upon arrival to [facility] ED, pulse was obtained. Patient breathing tube exchanged for ETT. Started on epi drip for hypotension. Patient remains unresponsive. EKG sinus tachy; no STEMI appreciated. CT head pending. Labs pending.  PCCM consulted for icu admission.  Pertinent  Medical History   Past Medical History:  Diagnosis Date   Acute gastritis without bleeding Fall 2019   H pylori NEG on EGD   Cervical cancer ([place])    around age 62   Colon polyps    COVID-19 virus infection 07/2020   paxlovid   Essential hypertension    GAD (generalized anxiety disorder)    GERD (gastroesophageal reflux disease)    well controlled with ranitidine bid   Hay fever    Hypercholesterolemia 12/2020   LDL 156-->frmhm risk 7%-->TLC   Hypothyroidism    IBS (irritable bowel syndrome)    Insomnia    Kidney stones    Moderate persistent asthma    Not taking controller meds b/c she said she improved significantly with cutting back on tobacco use.   Psoriasis    Tobacco dependence    ongoing as of 02/2017     Significant Hospital Events: Including procedures, antibiotic start and stop dates in addition to other pertinent events   1/13: admitted to [facility]; post cardiac arrest/intubated  Interim History / Subjective:   Remains intubated and sedated.  cEEG in place. IV depakote and Keppra started per neuro for myoclonic seizures. Propofol also started.  TTM protocol started  Objective   Blood  pressure 100/73, pulse 62, temperature (!) 96.4 F (35.8 C), resp. rate (!) 32, height 5\' 6"  (1.676 m), weight 75.6 kg, SpO2 100 %.    Vent Mode: PRVC FiO2 (%):  [40 %-100 %] 80 % Set Rate:  [20 bmp-35 bmp] 32 bmp Vt Set:  [470 mL] 470 mL PEEP:  [5 cmH20-8 cmH20] 8 cmH20 Plateau Pressure:  [21 cmH20-22 cmH20] 21 cmH20   Intake/Output Summary (Last 24 hours) at 03/03/2021 1125 Last data filed at 03/03/2021 0701 Gross per 24 hour  Intake 1270.05 ml  Output 1050 ml  Net 220.05 ml   Filed Weights   03/01/2021 2203 03/03/21 0447  Weight: 88.1 kg 75.6 kg    Examination: General:  critically ill appearing on mech vent HEENT: MM pink/moist; ETT and OG in place Neuro: Unresponsive; cough/gag reflex present; occasional twitching of eyes CV: s1s2, no m/r/g; EKG sinus tachy w/ no STEMI appreciated PULM:  course breath sounds. No wheezing. 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 P: -neuro consulted -CT head pending -UDS positive for benzos and cocaine - EtOH level 82 on admission -ammonia mildly elevated -Keppra and depakote per neuro -prn versed for seizure - On propofol -cEEG -frequent neuro checks  Elevated LFTs: likely shock liver P: -trend CMP  Hyperglycemia P: -check a1c -SSI and cbg monitoring  Asthma Hx of tobacco abuse P: -prn duoneb for wheezing  HTN HLD P: -hold home  antihypertensives -no home statin on file  GAD P: -hold home meds  GERD P: -PPI  Hypothyroidism P: -check TSH -start home synthroid  Polysubstance abuse - utox positive for etoh, cocaine and benzos on admission  Best Practice (right click and "Reselect all SmartList Selections" daily)   Diet/type: NPO w/ meds via tube DVT prophylaxis: SCD GI prophylaxis: PPI Lines: N/A Foley:  N/A Code Status:  full code Last date of multidisciplinary goals of care discussion [will contact family]  Labs   CBC: Recent Labs  Lab 03/11/2021 2020 03/18/2021 2042 03/15/2021 2303 03/01/2021 2341 03/03/21 0051 03/03/21 0404  WBC 20.9*  --   --  22.2* 24.0*  --   NEUTROABS 9.6*  --   --   --   --   --   HGB 14.9 14.3 13.3 13.6 13.2 13.3  HCT 46.9* 42.0 39.0 42.7 41.7 39.0  MCV 90.7  --   --  88.8 89.3  --   PLT 235  --   --  307 302  --     Basic Metabolic Panel: Recent Labs  Lab 03/15/2021 2020 03/06/2021 2042 03/04/2021 2303 03/20/2021 2341 03/03/21 0051 03/03/21 0404  NA 137 136 141  --  143 141  K 3.5 3.3* 3.2*  --  3.6 3.3*  CL 103  --   --   --  110  --   CO2 17*  --   --   --  17*  --   GLUCOSE 404*  --   --   --  259*  --   BUN 10  --   --   --  11  --   CREATININE 1.03*  --   --  1.06* 1.01*  --   CALCIUM 8.6*  --   --   --  7.6*  --   MG 2.9*  --   --   --   --   --    GFR: Estimated Creatinine Clearance: 62.3 mL/min (A) (by C-G formula based on SCr of 1.01 mg/dL (H)). Recent Labs  Lab 02/28/2021 2020 02/24/2021 2222 03/12/2021 2341 03/03/21 0051 03/03/21 0102  PROCALCITON  --   --  0.56  --   --   WBC 20.9*  --  22.2* 24.0*  --   LATICACIDVEN >9.0* 7.6*  --   --  5.2*    Liver Function Tests: Recent Labs  Lab 02/18/2021 2020 03/03/21 0051 03/03/21 0529  AST 295* QUANTITY NOT SUFFICIENT, UNABLE TO PERFORM TEST 494*  ALT 309* QUANTITY NOT SUFFICIENT, UNABLE TO PERFORM TEST 370*  ALKPHOS 99 74  --   BILITOT 0.6 QUANTITY NOT SUFFICIENT, UNABLE TO PERFORM TEST 0.5  PROT  6.4* 5.4*  --   ALBUMIN 3.7 3.0*  --    No results for input(s): LIPASE, AMYLASE in the last 168 hours. Recent Labs  Lab 02/22/2021 2222  AMMONIA 48*    ABG    Component Value Date/Time  PHART 7.221 (L) 03/03/2021 0404   PCO2ART 43.1 03/03/2021 0404   PO2ART 146 (H) 03/03/2021 0404   HCO3 18.3 (L) 03/03/2021 0404   TCO2 20 (L) 03/03/2021 0404   ACIDBASEDEF 10.0 (H) 03/03/2021 0404   O2SAT 99.0 03/03/2021 0404     Coagulation Profile: No results for input(s): INR, PROTIME in the last 168 hours.  Cardiac Enzymes: No results for input(s): CKTOTAL, CKMB, CKMBINDEX, TROPONINI in the last 168 hours.  HbA1C: Hgb A1c MFr Bld  Date/Time Value Ref Range Status  03/12/2021 10:12 PM 6.0 (H) 4.8 - 5.6 % Final    Comment:    (NOTE) Pre diabetes:          5.7%-6.4%  Diabetes:              >6.4%  Glycemic control for   <7.0% adults with diabetes   01/04/2016 11:40 AM 5.6 <5.7 % Final    Comment:      For the purpose of screening for the presence of diabetes:   <5.7%       Consistent with the absence of diabetes 5.7-6.4 %   Consistent with increased risk for diabetes (prediabetes) >=6.5 %     Consistent with diabetes   This assay result is consistent with a decreased risk of diabetes.   Currently, no consensus exists regarding use of hemoglobin A1c for diagnosis of diabetes in children.   According to American Diabetes Association (ADA) guidelines, hemoglobin A1c <7.0% represents optimal control in non-pregnant diabetic patients. Different metrics may apply to specific patient populations. Standards of Medical Care in Diabetes (ADA).       CBG: Recent Labs  Lab 03/03/21 0051 03/03/21 0311 03/03/21 0727 03/03/21 1120  GLUCAP 231* 220* 118* 101*       Critical care time: 45 minutes    Freda Jackson, MD [facility] Office: [phone]   See Amion for personal pager PCCM on call pager [phone] until 7pm. Please call Elink  7p-7a. [phone]

## 2021-03-03 NOTE — Procedures (Addendum)
Patient Name: Jamie Mercado  MRN: [id number]  Epilepsy Attending: Lora Havens  Referring Physician/Provider: Amie Portland, MD Duration: 03/03/2021 0239 to 0730   Patient history: 61 year old with above past medical history brought in after being found down in a hotel room of unknown duration.  CPR in the field and in the ER for total about 50 minutes duration prior to ROSC.  Right after ROSC, started having what appears to be myoclonic jerking. EEG to evaluate for seizure   Level of alertness: comatose   AEDs during EEG study: LEV, propofol   Technical aspects: This EEG study was done with scalp electrodes positioned according to the 10-20 International system of electrode placement. Electrical activity was acquired at a sampling rate of 500Hz  and reviewed with a high frequency filter of 70Hz  and a low frequency filter of 1Hz . EEG data were recorded continuously and digitally stored.    Description: At the beginning of study, patient was noted to have episodes of spontaneous brief eye opening every 2 to 40 seconds . Concomitant eeg showed generalized polyspikes consistent with myoclonic seizure. Gradually, myoclonic seizures resolved and eeg showed burst suppression pattern with generalized highly epileptiform bursts lasting 5-15 seconds alternating with about 1 minute of generalized eeg suppression.  Hyperventilation and photic stimulation were not performed.      ABNORMALITY - Myoclonic seizure, generalized - Burst suppression with highly epileptiform discharges, generalized     IMPRESSION: This study initially showed episodes of brief spontaneous eye opening consistent with myoclonic seizure every 2 to 40 seconds. Gradually as medications were adjusted, EEG showed evidence of epileptogenicity with generalized onset and high potential for seizure recurrence. Additionally, EEG was suggestive of  profound diffuse encephalopathy. With h/o cardiac arrest, this eeg is most likely suggestive  of anoxic-hypoxic brain injury.  Ajanee Buren [name]

## 2021-03-03 NOTE — Progress Notes (Signed)
Subjective: Brief HPI:  Jamie Mercado is a 61 y.o. female past medical history of hypertension, generalized anxiety disorder, hyperlipidemia, asthma, tobacco dependence, hypothyroidism, was brought down to the ER after she was found down in a hotel room-unknown amount of time of unresponsiveness.  About 45 minutes of CPR was performed for PEA arrest that was noted upon EMS arrival.  4 doses of epi given.  In the ER, ROSC was obtained in 5 minutes for a total of about 50 minutes of downtime minimum.  She was started on epinephrine drip for hypotension.  Remained unresponsive and GCS 3 in ER-emergently intubated.  Started exhibiting whole body jerking for which neurology was consulted.  No family/patient attendant at bedside to provide any further history.  Overnight, EEG was obtained revealing generalized myoclonic seizure in addition to a burst suppression pattern with generalized highly epileptiform discharges. She has continued to exhibit repetitive nonrhythmic eye opening which showed EEG correlate of myoclonic seizure activity occurring every 2-40 seconds.   Objective: Current vital signs: BP 99/74    Pulse 63    Temp (!) 94.3 F (34.6 C)    Resp (!) 32    Ht _0  (1.676 m)    Wt 75.6 kg    SpO2 100%    BMI 26.90 kg/m  Vital signs in last 24 hours: Temp:  [92.8 F (33.8 C)-97.6 F (36.4 C)] 94.3 F (34.6 C) (01/14 0700) Pulse Rate:  [59-97] 63 (01/14 0530) Resp:  [16-32] 32 (01/14 0700) BP: (83-134)/(54-101) 99/74 (01/14 0700) SpO2:  [85 %-100 %] 100 % (01/14 0530) FiO2 (%):  [40 %-100 %] 80 % (01/14 0410) Weight:  [75.6 kg-88.1 kg] 75.6 kg (01/14 0447)  Intake/Output from previous day: 01/13 0701 - 01/14 0700 In: 1244 [I.V.:79.3; IV Piggyback:1164.7] Out: -  Intake/Output this shift: No intake/output data recorded. Nutritional status:  Diet Order             Diet NPO time specified  Diet effective now                  HEENT: [place]/AT. EEG leads in place.  Lungs:  Intubated Ext: No edema  Neurological Exam:  Ment: Intubated and sedated. Does not react to any external stimuli. Not responding to name or commands. No spontaneous movements other than stereotyped nonrhythmic eye opening consistent with myoclonic activity occurring every few seconds on average.  CN: Pupils are equal and 2 mm with minimal to no reaction to light. No blink to threat. Eyes are supraducted at the midline with slow low amplitude vertical nystagmus. No oculocephalic reflex. No corneal reflex. No cough or gag reflex. Face is flaccidly symmetric.  Motor/Sensory: Stimulus induced myoclonus has resolved. No spontaneous limb jerking seen. Tone is decreased x 4. No posturing or other movement to noxious.  Reflexes: Hypoactive x 4 Cerebellar/Gait: Unable to assess Coordination cannot be tested given her mentation  Lab Results: Results for orders placed or performed during the hospital encounter of 03/14/2021 (from the past 48 hour(s))  CBC with Differential     Status: Abnormal   Collection Time: 02/26/2021  8:20 PM  Result Value Ref Range   WBC 20.9 (H) 4.0 - 10.5 K/uL   RBC 5.17 (H) 3.87 - 5.11 MIL/uL   Hemoglobin 14.9 12.0 - 15.0 g/dL   HCT 46.9 (H) 36.0 - 46.0 %   MCV 90.7 80.0 - 100.0 fL   MCH 28.8 26.0 - 34.0 pg   MCHC 31.8 30.0 - 36.0 g/dL  RDW 13.4 11.5 - 15.5 %   Platelets 235 150 - 400 K/uL   nRBC 0.4 (H) 0.0 - 0.2 %   Neutrophils Relative % 46 %   Neutro Abs 9.6 (H) 1.7 - 7.7 K/uL   Lymphocytes Relative 46 %   Lymphs Abs 9.6 (H) 0.7 - 4.0 K/uL   Monocytes Relative 4 %   Monocytes Absolute 0.8 0.1 - 1.0 K/uL   Eosinophils Relative 4 %   Eosinophils Absolute 0.8 (H) 0.0 - 0.5 K/uL   Basophils Relative 0 %   Basophils Absolute 0.0 0.0 - 0.1 K/uL   WBC Morphology See Note     Comment: Moderate Left Shift. >5% Metas and Myelos, Occ Pro Noted.   nRBC 0 0 /100 WBC   Abs Immature Granulocytes 0.00 0.00 - 0.07 K/uL    Comment: Performed at [facility], [place]  [address] [place], [place] [address]  Comprehensive metabolic panel     Status: Abnormal   Collection Time: 03/15/2021  8:20 PM  Result Value Ref Range   Sodium 137 135 - 145 mmol/L   Potassium 3.5 3.5 - 5.1 mmol/L   Chloride 103 98 - 111 mmol/L   CO2 17 (L) 22 - 32 mmol/L   Glucose, Bld 404 (H) 70 - 99 mg/dL    Comment: Glucose reference range applies only to samples taken after fasting for at least 8 hours.   BUN 10 6 - 20 mg/dL   Creatinine, Ser 1.03 (H) 0.44 - 1.00 mg/dL   Calcium 8.6 (L) 8.9 - 10.3 mg/dL   Total Protein 6.4 (L) 6.5 - 8.1 g/dL   Albumin 3.7 3.5 - 5.0 g/dL   AST 295 (H) 15 - 41 U/L   ALT 309 (H) 0 - 44 U/L   Alkaline Phosphatase 99 38 - 126 U/L   Total Bilirubin 0.6 0.3 - 1.2 mg/dL   GFR, Estimated >60 >60 mL/min    Comment: (NOTE) Calculated using the CKD-EPI Creatinine Equation (2021)    Anion gap 17 (H) 5 - 15    Comment: Performed at [facility], [place] [address], [place], [place] [address]  Lactic acid, plasma     Status: Abnormal   Collection Time: 02/26/2021  8:20 PM  Result Value Ref Range   Lactic Acid, Venous >9.0 (HH) 0.5 - 1.9 mmol/L    Comment: CRITICAL RESULT CALLED TO, READ BACK BY AND VERIFIED WITH: JASPER [facility] 03/14/2021 2116 WAYK Performed at [facility], [place] [address] [place], [place] [address]   Troponin I (High Sensitivity)     Status: Abnormal   Collection Time: 02/28/2021  8:20 PM  Result Value Ref Range   Troponin I (High Sensitivity) 182 (HH) <18 ng/L    Comment: CRITICAL RESULT CALLED TO, READ BACK BY AND VERIFIED WITH: JASPER A,RN 03/09/2021 2203 WAYK (NOTE) Elevated high sensitivity troponin I (hsTnI) values and significant  changes across serial measurements may suggest ACS but many other  chronic and acute conditions are known to elevate hsTnI results.  Refer to the Links section for chest pain algorithms and additional  guidance. Performed at [facility], [place] [address] [place],  [address]   Ethanol      Status: Abnormal   Collection Time: 03/15/2021  8:20 PM  Result Value Ref Range   Alcohol, Ethyl (B) 82 (H) <10 mg/dL    Comment: (NOTE) Lowest detectable limit for serum alcohol is 10 mg/dL.  For medical purposes only.  Performed at [facility], [place] [address], [place], [place] [address]   Salicylate level     Status: Abnormal   Collection Time: 03/04/2021  8:20 PM  Result Value Ref Range   Salicylate Lvl <0.0 (L) 7.0 - 30.0 mg/dL    Comment: Performed at [facility] [address], [place], [place] [address]  Magnesium     Status: Abnormal   Collection Time: 02/28/2021  8:20 PM  Result Value Ref Range   Magnesium 2.9 (H) 1.7 - 2.4 mg/dL    Comment: Performed at [facility] [address] [place], [place] [address]  TSH     Status: Abnormal   Collection Time: 02/20/2021  8:20 PM  Result Value Ref Range   TSH 33.236 (H) 0.350 - 4.500 uIU/mL    Comment: Performed by a 3rd Generation assay with a functional sensitivity of <=0.01 uIU/mL. Performed at [facility], [place] [address], [place], [place] [address]   Resp Panel by RT-PCR (Flu A&B, Covid) Nasopharyngeal Swab     Status: None   Collection Time: 03/14/2021  8:31 PM   Specimen: Nasopharyngeal Swab; Nasopharyngeal(NP) swabs in vial transport medium  Result Value Ref Range   SARS Coronavirus 2 by RT PCR NEGATIVE NEGATIVE    Comment: (NOTE) SARS-CoV-2 target nucleic acids are NOT DETECTED.  The SARS-CoV-2 RNA is generally detectable in upper respiratory specimens during the acute phase of infection. The lowest concentration of SARS-CoV-2 viral copies this assay can detect is 138 copies/mL. A negative result does not preclude SARS-Cov-2 infection and should not be used as the sole basis for treatment or other patient management decisions. A negative result may occur with  improper specimen collection/handling, submission of specimen other than nasopharyngeal swab, presence of viral mutation(s) within  the areas targeted by this assay, and inadequate number of viral copies(<138 copies/mL). A negative result must be combined with clinical observations, patient history, and epidemiological information. The expected result is Negative.  Fact Sheet for Patients:  EntrepreneurPulse.com.au  Fact Sheet for Healthcare Providers:  IncredibleEmployment.be  This test is no t yet approved or cleared by the [place] FDA and  has been authorized for detection and/or diagnosis of SARS-CoV-2 by FDA under an Emergency Use Authorization (EUA). This EUA will remain  in effect (meaning this test can be used) for the duration of the COVID-19 declaration under Section 564(b)(1) of the Act, 21 U.S.C.section 360bbb-3(b)(1), unless the authorization is terminated  or revoked sooner.       Influenza A by PCR NEGATIVE NEGATIVE   Influenza B by PCR NEGATIVE NEGATIVE    Comment: (NOTE) The Xpert Xpress SARS-CoV-2/FLU/RSV plus assay is intended as an aid in the diagnosis of influenza from Nasopharyngeal swab specimens and should not be used as a sole basis for treatment. Nasal washings and aspirates are unacceptable for Xpert Xpress SARS-CoV-2/FLU/RSV testing.  Fact Sheet for Patients: EntrepreneurPulse.com.au  Fact Sheet for Healthcare Providers: IncredibleEmployment.be  This test is not yet approved or cleared by the [place] FDA and has been authorized for detection and/or diagnosis of SARS-CoV-2 by FDA under an Emergency Use Authorization (EUA). This EUA will remain in effect (meaning this test can be used) for the duration of the COVID-19 declaration under Section 564(b)(1) of the Act, 21 U.S.C. section 360bbb-3(b)(1), unless the authorization is terminated or revoked.  Performed at [facility], [place] [address], [place], [place] [address]   I-Stat arterial blood gas, ED  Status: Abnormal   Collection  Time: 02/18/2021  8:42 PM  Result Value Ref Range   pH, Arterial 7.068 (LL) 7.350 - 7.450   pCO2 arterial 50.5 (H) 32.0 - 48.0 mmHg   pO2, Arterial 308 (H) 83.0 - 108.0 mmHg   Bicarbonate 14.6 (L) 20.0 - 28.0 mmol/L   TCO2 16 (L) 22 - 32 mmol/L   O2 Saturation 100.0 %   Acid-base deficit 16.0 (H) 0.0 - 2.0 mmol/L   Sodium 136 135 - 145 mmol/L   Potassium 3.3 (L) 3.5 - 5.1 mmol/L   Calcium, Ion 1.11 (L) 1.15 - 1.40 mmol/L   HCT 42.0 36.0 - 46.0 %   Hemoglobin 14.3 12.0 - 15.0 g/dL   Patient temperature 98.6 F    Collection site RADIAL, ALLEN'S TEST ACCEPTABLE    Drawn by Operator    Sample type ARTERIAL    Comment NOTIFIED PHYSICIAN   Culture, Respiratory w Gram Stain (tracheal aspirate)     Status: None (Preliminary result)   Collection Time: 02/22/2021 10:09 PM   Specimen: Tracheal Aspirate; Respiratory  Result Value Ref Range   Specimen Description TRACHEAL ASPIRATE    Special Requests NONE    Gram Stain      NO SQUAMOUS EPITHELIAL CELLS SEEN FEW WBC SEEN NO ORGANISMS SEEN Performed at [facility], [address], [place], [place] [address]    Culture PENDING    Report Status PENDING   Hemoglobin A1c     Status: Abnormal   Collection Time: 03/18/2021 10:12 PM  Result Value Ref Range   Hgb A1c MFr Bld 6.0 (H) 4.8 - 5.6 %    Comment: (NOTE) Pre diabetes:          5.7%-6.4%  Diabetes:              >6.4%  Glycemic control for   <7.0% adults with diabetes    Mean Plasma Glucose 125.5 mg/dL    Comment: Performed at [facility] [address], [place], [place] [address]  Ammonia     Status: Abnormal   Collection Time: 03/13/2021 10:22 PM  Result Value Ref Range   Ammonia 48 (H) 9 - 35 umol/L    Comment: Performed at [facility], [place] [address], [place], [place] [address]  Lactic acid, plasma     Status: Abnormal   Collection Time: 02/18/2021 10:22 PM  Result Value Ref Range   Lactic Acid, Venous 7.6 (HH) 0.5 - 1.9 mmol/L    Comment: CRITICAL VALUE NOTED.   VALUE IS CONSISTENT WITH PREVIOUSLY REPORTED AND CALLED VALUE. Performed at [facility], [place] [address] [place], [place] [address]   HIV Antibody (routine testing w rflx)     Status: None   Collection Time: 03/17/2021 10:22 PM  Result Value Ref Range   HIV Screen 4th Generation wRfx Non Reactive Non Reactive    Comment: Performed at [facility], [place] [address] [place], [place] [address]  I-Stat arterial blood gas, ED     Status: Abnormal   Collection Time: 02/28/2021 11:03 PM  Result Value Ref Range   pH, Arterial 7.152 (LL) 7.350 - 7.450   pCO2 arterial 51.7 (H) 32.0 - 48.0 mmHg   pO2, Arterial 78 (L) 83.0 - 108.0 mmHg   Bicarbonate 18.2 (L) 20.0 - 28.0 mmol/L   TCO2 20 (L) 22 - 32 mmol/L   O2 Saturation 91.0 %   Acid-base deficit 11.0 (H) 0.0 - 2.0 mmol/L  Sodium 141 135 - 145 mmol/L   Potassium 3.2 (L) 3.5 - 5.1 mmol/L   Calcium, Ion 1.11 (L) 1.15 - 1.40 mmol/L   HCT 39.0 36.0 - 46.0 %   Hemoglobin 13.3 12.0 - 15.0 g/dL   Patient temperature 97.6 F    Collection site RADIAL, ALLEN'S TEST ACCEPTABLE    Drawn by Operator    Sample type ARTERIAL    Comment NOTIFIED PHYSICIAN   MRSA Next Gen by PCR, Nasal     Status: Abnormal   Collection Time: 03/17/2021 11:33 PM   Specimen: Nasal Mucosa; Nasal Swab  Result Value Ref Range   MRSA by PCR Next Gen DETECTED (A) NOT DETECTED    Comment: RESULT CALLED TO, READ BACK BY AND VERIFIED WITH: RN MICHELLE T. 03/03/21_0 :28 BY TW (NOTE) The GeneXpert MRSA Assay (FDA approved for NASAL specimens only), is one component of a comprehensive MRSA colonization surveillance program. It is not intended to diagnose MRSA infection nor to guide or monitor treatment for MRSA infections. Test performance is not FDA approved in patients less than 70 years old. Performed at [facility], [place] [address], [place], [place] [address]   Rapid urine drug screen (hospital performed)     Status: Abnormal   Collection Time: 03/08/2021 11:39 PM   Result Value Ref Range   Opiates NONE DETECTED NONE DETECTED   Cocaine POSITIVE (A) NONE DETECTED   Benzodiazepines POSITIVE (A) NONE DETECTED   Amphetamines NONE DETECTED NONE DETECTED   Tetrahydrocannabinol NONE DETECTED NONE DETECTED   Barbiturates NONE DETECTED NONE DETECTED    Comment: (NOTE) DRUG SCREEN FOR MEDICAL PURPOSES ONLY.  IF CONFIRMATION IS NEEDED FOR ANY PURPOSE, NOTIFY LAB WITHIN 5 DAYS.  LOWEST DETECTABLE LIMITS FOR URINE DRUG SCREEN Drug Class                     Cutoff (ng/mL) Amphetamine and metabolites    1000 Barbiturate and metabolites    200 Benzodiazepine                 701 Tricyclics and metabolites     300 Opiates and metabolites        300 Cocaine and metabolites        300 THC                            50 Performed at [facility], [place] [address] [place], [place] [address]   Urinalysis, Routine w reflex microscopic Urine, Catheterized     Status: Abnormal   Collection Time: 03/01/2021 11:39 PM  Result Value Ref Range   Color, Urine YELLOW YELLOW   APPearance CLEAR CLEAR   Specific Gravity, Urine 1.015 1.005 - 1.030   pH 6.0 5.0 - 8.0   Glucose, UA >=500 (A) NEGATIVE mg/dL   Hgb urine dipstick MODERATE (A) NEGATIVE   Bilirubin Urine NEGATIVE NEGATIVE   Ketones, ur NEGATIVE NEGATIVE mg/dL   Protein, ur 30 (A) NEGATIVE mg/dL   Nitrite NEGATIVE NEGATIVE   Leukocytes,Ua NEGATIVE NEGATIVE    Comment: Performed at [facility] [address], [place], [place] [address]  Urinalysis, Microscopic (reflex)     Status: Abnormal   Collection Time: 02/21/2021 11:39 PM  Result Value Ref Range   RBC / HPF 0-5 0 - 5 RBC/hpf   WBC, UA 0-5 0 - 5 WBC/hpf   Bacteria, UA RARE (A) NONE SEEN   Squamous  Epithelial / LPF 0-5 0 - 5   Mucus PRESENT     Comment: Performed at [facility], [place] [address], [place], [place] [address]  Troponin I (High Sensitivity)     Status: Abnormal   Collection Time: 03/18/2021 11:41 PM  Result Value Ref Range    Troponin I (High Sensitivity) 2,402 (HH) <18 ng/L    Comment: CRITICAL VALUE NOTED.  VALUE IS CONSISTENT WITH PREVIOUSLY REPORTED AND CALLED VALUE. (NOTE) Elevated high sensitivity troponin I (hsTnI) values and significant  changes across serial measurements may suggest ACS but many other  chronic and acute conditions are known to elevate hsTnI results.  Refer to the Links section for chest pain algorithms and additional  guidance. Performed at [facility], [place] [address], [place], [place] [address]   CBC     Status: Abnormal   Collection Time: 03/01/2021 11:41 PM  Result Value Ref Range   WBC 22.2 (H) 4.0 - 10.5 K/uL   RBC 4.81 3.87 - 5.11 MIL/uL   Hemoglobin 13.6 12.0 - 15.0 g/dL   HCT 42.7 36.0 - 46.0 %   MCV 88.8 80.0 - 100.0 fL   MCH 28.3 26.0 - 34.0 pg   MCHC 31.9 30.0 - 36.0 g/dL   RDW 13.5 11.5 - 15.5 %   Platelets 307 150 - 400 K/uL   nRBC 0.0 0.0 - 0.2 %    Comment: Performed at [facility], [place] [address], [place], [place] [address]  Creatinine, serum     Status: Abnormal   Collection Time: 02/26/2021 11:41 PM  Result Value Ref Range   Creatinine, Ser 1.06 (H) 0.44 - 1.00 mg/dL   GFR, Estimated >60 >60 mL/min    Comment: (NOTE) Calculated using the CKD-EPI Creatinine Equation (2021) Performed at [facility] [address] [place], [place] [address]   CBC     Status: Abnormal   Collection Time: 03/03/21 12:51 AM  Result Value Ref Range   WBC 24.0 (H) 4.0 - 10.5 K/uL   RBC 4.67 3.87 - 5.11 MIL/uL   Hemoglobin 13.2 12.0 - 15.0 g/dL   HCT 41.7 36.0 - 46.0 %   MCV 89.3 80.0 - 100.0 fL   MCH 28.3 26.0 - 34.0 pg   MCHC 31.7 30.0 - 36.0 g/dL   RDW 13.7 11.5 - 15.5 %   Platelets 302 150 - 400 K/uL   nRBC 0.0 0.0 - 0.2 %    Comment: Performed at [facility], [place] [address], [place], [place] [address]  Comprehensive metabolic panel     Status: Abnormal   Collection Time: 03/03/21 12:51 AM  Result Value Ref Range   Sodium 143 135 - 145  mmol/L   Potassium 3.6 3.5 - 5.1 mmol/L   Chloride 110 98 - 111 mmol/L   CO2 17 (L) 22 - 32 mmol/L   Glucose, Bld 259 (H) 70 - 99 mg/dL    Comment: Glucose reference range applies only to samples taken after fasting for at least 8 hours.   BUN 11 6 - 20 mg/dL   Creatinine, Ser 1.01 (H) 0.44 - 1.00 mg/dL   Calcium 7.6 (L) 8.9 - 10.3 mg/dL   Total Protein 5.4 (L) 6.5 - 8.1 g/dL   Albumin 3.0 (L) 3.5 - 5.0 g/dL   AST QUANTITY NOT SUFFICIENT, UNABLE TO PERFORM TEST 15 - 41 U/L   ALT QUANTITY NOT SUFFICIENT, UNABLE TO PERFORM TEST 0 - 44 U/L  Alkaline Phosphatase 74 38 - 126 U/L   Total Bilirubin QUANTITY NOT SUFFICIENT, UNABLE TO PERFORM TEST 0.3 - 1.2 mg/dL   GFR, Estimated >60 >60 mL/min    Comment: (NOTE) Calculated using the CKD-EPI Creatinine Equation (2021)    Anion gap 16 (H) 5 - 15    Comment: Performed at [facility] [address], [place], [place] [address]  Glucose, capillary     Status: Abnormal   Collection Time: 03/03/21 12:51 AM  Result Value Ref Range   Glucose-Capillary 231 (H) 70 - 99 mg/dL    Comment: Glucose reference range applies only to samples taken after fasting for at least 8 hours.  Lactic acid, plasma     Status: Abnormal   Collection Time: 03/03/21  1:02 AM  Result Value Ref Range   Lactic Acid, Venous 5.2 (HH) 0.5 - 1.9 mmol/L    Comment: CRITICAL VALUE NOTED.  VALUE IS CONSISTENT WITH PREVIOUSLY REPORTED AND CALLED VALUE. Performed at [facility], [place] [address], [place], [place] [address]   T4, free     Status: None   Collection Time: 03/03/21  1:02 AM  Result Value Ref Range   Free T4 0.73 0.61 - 1.12 ng/dL    Comment: (NOTE) Biotin ingestion may interfere with free T4 tests. If the results are inconsistent with the TSH level, previous test results, or the clinical presentation, then consider biotin interference. If needed, order repeat testing after stopping biotin. Performed at [facility], [place] [address],  [place], [place] [address]   Glucose, capillary     Status: Abnormal   Collection Time: 03/03/21  3:11 AM  Result Value Ref Range   Glucose-Capillary 220 (H) 70 - 99 mg/dL    Comment: Glucose reference range applies only to samples taken after fasting for at least 8 hours.  Triglycerides     Status: Abnormal   Collection Time: 03/03/21  3:15 AM  Result Value Ref Range   Triglycerides 170 (H) <150 mg/dL    Comment: Performed at [facility] [address], [place], [place] [address]  I-STAT 7, (LYTES, BLD GAS, ICA, H+H)     Status: Abnormal   Collection Time: 03/03/21  4:04 AM  Result Value Ref Range   pH, Arterial 7.221 (L) 7.350 - 7.450   pCO2 arterial 43.1 32.0 - 48.0 mmHg   pO2, Arterial 146 (H) 83.0 - 108.0 mmHg   Bicarbonate 18.3 (L) 20.0 - 28.0 mmol/L   TCO2 20 (L) 22 - 32 mmol/L   O2 Saturation 99.0 %   Acid-base deficit 10.0 (H) 0.0 - 2.0 mmol/L   Sodium 141 135 - 145 mmol/L   Potassium 3.3 (L) 3.5 - 5.1 mmol/L   Calcium, Ion 1.13 (L) 1.15 - 1.40 mmol/L   HCT 39.0 36.0 - 46.0 %   Hemoglobin 13.3 12.0 - 15.0 g/dL   Patient temperature 34.4 C    Collection site art line    Drawn by RT    Sample type ARTERIAL    Comment NOTIFIED PHYSICIAN   ALT     Status: Abnormal   Collection Time: 03/03/21  5:29 AM  Result Value Ref Range   ALT 370 (H) 0 - 44 U/L    Comment: Performed at [facility], [place] [address] [place], [place] [address]  AST     Status: Abnormal   Collection Time: 03/03/21  5:29 AM  Result Value Ref Range   AST 494 (  H) 15 - 41 U/L    Comment: Performed at [facility], [place] [address] [place], [place] [address]  Bilirubin, total     Status: None   Collection Time: 03/03/21  5:29 AM  Result Value Ref Range   Total Bilirubin 0.5 0.3 - 1.2 mg/dL    Comment: Performed at [facility] [address], [place], [place] [address]    Recent Results (from the past 240 hour(s))  Resp Panel by RT-PCR (Flu A&B, Covid) Nasopharyngeal Swab      Status: None   Collection Time: 03/14/2021  8:31 PM   Specimen: Nasopharyngeal Swab; Nasopharyngeal(NP) swabs in vial transport medium  Result Value Ref Range Status   SARS Coronavirus 2 by RT PCR NEGATIVE NEGATIVE Final    Comment: (NOTE) SARS-CoV-2 target nucleic acids are NOT DETECTED.  The SARS-CoV-2 RNA is generally detectable in upper respiratory specimens during the acute phase of infection. The lowest concentration of SARS-CoV-2 viral copies this assay can detect is 138 copies/mL. A negative result does not preclude SARS-Cov-2 infection and should not be used as the sole basis for treatment or other patient management decisions. A negative result may occur with  improper specimen collection/handling, submission of specimen other than nasopharyngeal swab, presence of viral mutation(s) within the areas targeted by this assay, and inadequate number of viral copies(<138 copies/mL). A negative result must be combined with clinical observations, patient history, and epidemiological information. The expected result is Negative.  Fact Sheet for Patients:  EntrepreneurPulse.com.au  Fact Sheet for Healthcare Providers:  IncredibleEmployment.be  This test is no t yet approved or cleared by the [place] FDA and  has been authorized for detection and/or diagnosis of SARS-CoV-2 by FDA under an Emergency Use Authorization (EUA). This EUA will remain  in effect (meaning this test can be used) for the duration of the COVID-19 declaration under Section 564(b)(1) of the Act, 21 U.S.C.section 360bbb-3(b)(1), unless the authorization is terminated  or revoked sooner.       Influenza A by PCR NEGATIVE NEGATIVE Final   Influenza B by PCR NEGATIVE NEGATIVE Final    Comment: (NOTE) The Xpert Xpress SARS-CoV-2/FLU/RSV plus assay is intended as an aid in the diagnosis of influenza from Nasopharyngeal swab specimens and should not be used as a sole basis  for treatment. Nasal washings and aspirates are unacceptable for Xpert Xpress SARS-CoV-2/FLU/RSV testing.  Fact Sheet for Patients: EntrepreneurPulse.com.au  Fact Sheet for Healthcare Providers: IncredibleEmployment.be  This test is not yet approved or cleared by the [place] FDA and has been authorized for detection and/or diagnosis of SARS-CoV-2 by FDA under an Emergency Use Authorization (EUA). This EUA will remain in effect (meaning this test can be used) for the duration of the COVID-19 declaration under Section 564(b)(1) of the Act, 21 U.S.C. section 360bbb-3(b)(1), unless the authorization is terminated or revoked.  Performed at [facility], [place] [address] [place], [place] [address]   Culture, Respiratory w Gram Stain (tracheal aspirate)     Status: None (Preliminary result)   Collection Time: 03/03/2021 10:09 PM   Specimen: Tracheal Aspirate; Respiratory  Result Value Ref Range Status   Specimen Description TRACHEAL ASPIRATE  Final   Special Requests NONE  Final   Gram Stain   Final    NO SQUAMOUS EPITHELIAL CELLS SEEN FEW WBC SEEN NO ORGANISMS SEEN Performed at [facility], [address] [place],  [address]    Culture PENDING  Incomplete  Report Status PENDING  Incomplete  MRSA Next Gen by PCR, Nasal     Status: Abnormal   Collection Time: 03/15/2021 11:33 PM   Specimen: Nasal Mucosa; Nasal Swab  Result Value Ref Range Status   MRSA by PCR Next Gen DETECTED (A) NOT DETECTED Final    Comment: RESULT CALLED TO, READ BACK BY AND VERIFIED WITH: RN MICHELLE T. 03/03/21_0 :28 BY TW (NOTE) The GeneXpert MRSA Assay (FDA approved for NASAL specimens only), is one component of a comprehensive MRSA colonization surveillance program. It is not intended to diagnose MRSA infection nor to guide or monitor treatment for MRSA infections. Test performance is not FDA approved in patients less than 45 years old. Performed  at [facility], [place] [address] [place], [place] [address]     Lipid Panel Recent Labs    03/03/21 0315  TRIG 170*    Studies/Results: DG Abd 1 View  Result Date: 03/04/2021 CLINICAL DATA:  OG tube placement. EXAM: ABDOMEN - 1 VIEW COMPARISON:  None. FINDINGS: Multiple overlying radiopaque lead wires are noted. An orogastric tube is seen with its distal tip noted within the body of the stomach. The bowel gas pattern is normal. No radio-opaque calculi or other significant radiographic abnormality are seen. IMPRESSION: Orogastric tube positioning, as described above. Electronically Signed   By: Virgina Norfolk M.D.   On: 02/26/2021 20:57   CT Head Wo Contrast  Result Date: 03/18/2021 CLINICAL DATA:  Recent arrest with CPR EXAM: CT HEAD WITHOUT CONTRAST TECHNIQUE: Contiguous axial images were obtained from the base of the skull through the vertex without intravenous contrast. RADIATION DOSE REDUCTION: This exam was performed according to the departmental dose-optimization program which includes automated exposure control, adjustment of the mA and/or kV according to patient size and/or use of iterative reconstruction technique. COMPARISON:  MRI from 11/23/2020 FINDINGS: Brain: No evidence of acute infarction, hemorrhage, hydrocephalus, extra-axial collection or mass lesion/mass effect. Vascular: No hyperdense vessel or unexpected calcification. Skull: Normal. Negative for fracture or focal lesion. Sinuses/Orbits: Mucosal thickening in the nasal passages is noted. Paranasal sinuses appear within normal limits. Orbits and their contents are unremarkable. Other: None IMPRESSION: No acute intracranial abnormality noted. Electronically Signed   By: Inez Catalina M.D.   On: 03/03/2021 21:34   DG Chest Port 1 View  Result Date: 02/24/2021 CLINICAL DATA:  Status post CPR. EXAM: PORTABLE CHEST 1 VIEW COMPARISON:  January 25, 2021 FINDINGS: Overlying radiopaque tags and cardiac lead wires are  present. An endotracheal tube is seen with its distal tip approximately 4.2 cm from the carina. A nasogastric tube is noted with its distal end extending into the body of the stomach. Its side hole sits approximately 5.6 cm distal to the expected region of the gastroesophageal junction. Mild, diffusely increased lung markings are noted, bilaterally. There is no evidence of focal consolidation, pleural effusion or pneumothorax. The heart size and mediastinal contours are within normal limits. Lateral seventh and ninth left rib fractures are seen. IMPRESSION: 1. Endotracheal tube and nasogastric tube in place as described above. 2. Mild, diffusely increased lung markings, bilaterally, without evidence of focal consolidation or pneumothorax. 3. Lateral seventh and ninth left rib fractures. Electronically Signed   By: Virgina Norfolk M.D.   On: 03/07/2021 20:56   EEG adult  Result Date: 03/03/2021 Lora Havens, MD     03/03/2021  2:59 AM Patient Name: Jamie Mercado MRN: [id number] Epilepsy Attending: Lora Havens Referring Physician/Provider: Amie Portland, MD Date: 03/03/2021 Duration: 24.28  mins Patient history: 61 year old with above past medical history brought in after being found down in a hotel room of unknown duration.  CPR in the field and in the ER for total about 50 minutes duration prior to ROSC.  Right after ROSC, started having what appears to be myoclonic jerking. EEG to evaluate for seizure Level of alertness:  comatose AEDs during EEG study: LEV Technical aspects: This EEG study was done with scalp electrodes positioned according to the 10-20 International system of electrode placement. Electrical activity was acquired at a sampling rate of _0  and reviewed with a high frequency filter of _1  and a low frequency filter of _2 . EEG data were recorded continuously and digitally stored. Description: Patient was noted to have episodes of spontaneous brief eye opening every 2 to 40 seconds .  Concomitant eeg showed generalized polyspikes consistent with myoclonic seizure. In between myoclonic seizure, EEG showed continuous generalized background suppression. Hyperventilation and photic stimulation were not performed.   ABNORMALITY - Myoclonic seizure, generalized - EEG suppression, generalized IMPRESSION: This study showed episodes of brief spontaneous eye opening consistent with myoclonic seizure every 2 to 40 seconds. There is also profound diffuse encephalopathy. 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 Dr Rory Percy was notified. Priyanka [name]   Overnight EEG with video  Result Date: 03/03/2021 Lora Havens, MD     03/03/2021  6:26 AM Patient Name: Jamie Mercado MRN: [id number] Epilepsy Attending: Lora Havens Referring Physician/Provider: Amie Portland, MD Duration: 03/03/2021 0239 to 9323  Patient history: 61 year old with above past medical history brought in after being found down in a hotel room of unknown duration.  CPR in the field and in the ER for total about 50 minutes duration prior to ROSC.  Right after ROSC, started having what appears to be myoclonic jerking. EEG to evaluate for seizure  Level of alertness: comatose  AEDs during EEG study: LEV, propofol  Technical aspects: This EEG study was done with scalp electrodes positioned according to the 10-20 International system of electrode placement. Electrical activity was acquired at a sampling rate of _3  and reviewed with a high frequency filter of _4  and a low frequency filter of _5 . EEG data were recorded continuously and digitally stored.  Description: At the beginning of study, patient was noted to have episodes of spontaneous brief eye opening every 2 to 40 seconds . Concomitant eeg showed generalized polyspikes consistent with myoclonic seizure. Gradually, myoclonic seizures resolved and eeg showed burst suppression pattern with generalized highly epileptiform bursts  lasting 5-15 seconds alternating with about 1 minute of generalized eeg suppression.  Hyperventilation and photic stimulation were not performed.    ABNORMALITY - Myoclonic seizure, generalized - Burst suppression with highly epileptiform discharges, generalized   IMPRESSION: This study initially showed episodes of brief spontaneous eye opening consistent with myoclonic seizure every 2 to 40 seconds. Gradually as medications were adjusted, EEG showed evidence of epileptogenicity with generalized onset and high potential for seizure recurrence. Additionally, EEG was suggestive of  profound diffuse encephalopathy. 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 Priyanka [name]    Medications: Scheduled:  acetaminophen (TYLENOL) oral liquid 160 mg/5 mL  650 mg Per Tube Q4H   Or   acetaminophen  650 mg Rectal Q4H   chlorhexidine gluconate (MEDLINE KIT)  15 mL Mouth Rinse BID   Chlorhexidine Gluconate Cloth  6 each Topical Daily   docusate  100 mg  Per Tube BID   fentaNYL       folic acid  1 mg Per Tube Daily   heparin  5,000 Units Subcutaneous Q8H   insulin aspart  0-15 Units Subcutaneous Q4H   levETIRAcetam  1,500 mg Per Tube BID   levothyroxine  100 mcg Per Tube Daily   mouth rinse  15 mL Mouth Rinse 10 times per day   multivitamin with minerals  1 tablet Per Tube Daily   mupirocin ointment  1 application Nasal BID   pantoprazole sodium  40 mg Per Tube Daily   polyethylene glycol  17 g Per Tube Daily   thiamine  100 mg Per Tube Daily   valproic acid  500 mg Per Tube Q12H   Continuous:  sodium chloride Stopped (03/03/21 0103)   cefTRIAXone (ROCEPHIN)  IV Stopped (03/03/21 0047)   epinephrine 5 mcg/min (02/25/2021 2029)   norepinephrine (LEVOPHED) Adult infusion 4 mcg/min (03/03/21 0600)   propofol (DIPRIVAN) infusion 20 mcg/kg/min (03/03/21 0600)   vancomycin Stopped (03/03/21 0404)    CT-head-formal reading unremarkable.  On review by Neurology  team, there is symmetric effacement of sulci with some loss of gray-white differentiation in the parietotemporal regions bilaterally.  The frontal areas bilaterally look more normal in appearance.   Assessment: 61 year old with above past medical history brought in after being found down in a hotel room of unknown duration.  CPR in the field and in the ER for total about 50 minutes duration prior to ROSC. Right after ROSC, she started having what appeared to be myoclonic jerking. In the ED she was loaded with 1500 mg IV Keppra and also administered Versed. A second loaing dose of Keppra 1500 mg IV was administered after initial Neurology evaluation and she was started on 500 mg BID scheduled dosing. LTM EEG overnight revealed myoclonic seizures every few seconds. She was then administered a third loading dose of 1000 mg Keppra as well as an IV load of VPA 20 mg/kg and started on scheduled VPA at 500 mg BID. Keppra was then increased to 1000 mg IV BID. Propofol drip was also started at that time (3:12 AM)   - Exam this morning reveals continued myoclonic-like eye-opening throughout the exam followed by eyelid relaxation. Eyes are tonically supraducted with low-amplitude overlapping slow vertical nystagmus. Full-body and limb-myoclonus is now resolved. Several of her brainstem reflexes are absent. Does not exhibit any awareness of external stimuli.  - CT head with subtle abnormalities suggestive of anoxic brain injury - Severe anoxic brain injury also suspected given myoclonic jerking present on presentation which also portends a poor prognosis. - AM LTM EEG report describes generalized electrographic myoclonic seizures in addition to a burst suppression pattern with generalized highly epileptiform discharges. She has continued to exhibit repetitive nonrhythmic eye opening which showed EEG correlate of myoclonic seizure activity occurring every 2-40 seconds.     Recommendations: -Continue Keppra 1000 mg  BID -Continue propofol drip -Blood pressures continue to be soft and are requiring pressors to maintain her blood pressures. -Continue LTM EEG - Exam continues to appear very poor and suggestive of anoxic brain injury. In addition, myoclonus at presentation after cardiac arrest usually portends extremely poor prognosis in terms of a neurologically meaningful recovery.  -We will continue to follow the patient with you and follow serial exams to see if anything changes but at this time her condition is extremely critical.  40 minutes spent in the neurological evaluation and management of this critically ill patient.  LOS: 1 day   _0  signed: Dr. Kerney Elbe 03/03/2021  7:19 AM

## 2021-03-03 NOTE — Progress Notes (Addendum)
EEG with myoclonic seizures every few seconds.  Recs: -IV Depakote 20mg /kg -Additional Keppra load 1g IV now. Start Keppra 1g BID from 12h from now. -Depakote 500 BID from 12h from now. -Start propofol - 3mcg/kg/min.  -Will touch base with the PCCM team and make new order changes in the chart and request assitance with pressors. Spoke with Dr. Lucile Shutters at [facility]  -- Amie Portland, MD Neurologist [facility] Pager: [phone]  Additional 23min CC time

## 2021-03-03 NOTE — Progress Notes (Signed)
Pharmacy Antibiotic Note  Jamie Mercado is a 61 y.o. female admitted on 03/09/2021 with sepsis.  Pharmacy has been consulted for Vancomycin dosing. Found down in hotel. WBC elevated. Renal function appears ok for now.   Plan: Vancomycin 1000 mg IV q24h >>>Estimated AUC: 446 Ceftriaxone per MD Trend WBC, temp, renal function  F/U infectious work-up Drug levels as indicated   Height: 5\' 6"  (167.6 cm) Weight: 88.1 kg (194 lb 3.6 oz) IBW/kg (Calculated) : 59.3  Temp (24hrs), Avg:97.6 F (36.4 C), Min:97.6 F (36.4 C), Max:97.6 F (36.4 C)  Recent Labs  Lab 03/19/2021 2020 03/01/2021 2222 03/11/2021 2341 03/03/21 0051  WBC 20.9*  --  22.2* 24.0*  CREATININE 1.03*  --  1.06* 1.01*  LATICACIDVEN >9.0* 7.6*  --   --     Estimated Creatinine Clearance: 67 mL/min (A) (by C-G formula based on SCr of 1.01 mg/dL (H)).    Allergies  Allergen Reactions   Codeine Itching, Nausea And Vomiting and Other (See Comments)    Makes her feel wierd    Narda Bonds, PharmD, [place] Pharmacist Phone: [phone]

## 2021-03-04 ENCOUNTER — Other Ambulatory Visit (HOSPITAL_COMMUNITY): Payer: 59

## 2021-03-04 ENCOUNTER — Inpatient Hospital Stay (HOSPITAL_COMMUNITY): Payer: 59

## 2021-03-04 DIAGNOSIS — I469 Cardiac arrest, cause unspecified: Secondary | ICD-10-CM | POA: Diagnosis not present

## 2021-03-04 DIAGNOSIS — G931 Anoxic brain damage, not elsewhere classified: Secondary | ICD-10-CM | POA: Diagnosis not present

## 2021-03-04 LAB — COMPREHENSIVE METABOLIC PANEL
ALT: 261 U/L — ABNORMAL HIGH (ref 0–44)
AST: 219 U/L — ABNORMAL HIGH (ref 15–41)
Albumin: 3 g/dL — ABNORMAL LOW (ref 3.5–5.0)
Alkaline Phosphatase: 56 U/L (ref 38–126)
Anion gap: 15 (ref 5–15)
BUN: 15 mg/dL (ref 6–20)
CO2: 18 mmol/L — ABNORMAL LOW (ref 22–32)
Calcium: 8.6 mg/dL — ABNORMAL LOW (ref 8.9–10.3)
Chloride: 108 mmol/L (ref 98–111)
Creatinine, Ser: 0.86 mg/dL (ref 0.44–1.00)
GFR, Estimated: >60 mL/min (ref >60)
Glucose, Bld: 100 mg/dL — ABNORMAL HIGH (ref 70–99)
Potassium: 4.9 mmol/L (ref 3.5–5.1)
Sodium: 141 mmol/L (ref 135–145)
Total Bilirubin: 0.9 mg/dL (ref 0.3–1.2)
Total Protein: 5.8 g/dL — ABNORMAL LOW (ref 6.5–8.1)

## 2021-03-04 LAB — TROPONIN I (HIGH SENSITIVITY)
Troponin I (High Sensitivity): 632 ng/L (ref <18)
Troponin I (High Sensitivity): 510 ng/L (ref <18)

## 2021-03-04 LAB — LACTIC ACID, PLASMA
Lactic Acid, Venous: 3.2 mmol/L (ref 0.5–1.9)
Lactic Acid, Venous: 1.5 mmol/L (ref 0.5–1.9)

## 2021-03-04 LAB — GLUCOSE, CAPILLARY
Glucose-Capillary: 93 mg/dL (ref 70–99)
Glucose-Capillary: 95 mg/dL (ref 70–99)
Glucose-Capillary: 92 mg/dL (ref 70–99)
Glucose-Capillary: 110 mg/dL — ABNORMAL HIGH (ref 70–99)

## 2021-03-04 LAB — T3, FREE: T3, Free: 2.8 pg/mL (ref 2.0–4.4)

## 2021-03-04 LAB — AMMONIA: Ammonia: 41 umol/L — ABNORMAL HIGH (ref 9–35)

## 2021-03-04 LAB — TRIGLYCERIDES: Triglycerides: 333 mg/dL — ABNORMAL HIGH (ref <150)

## 2021-03-04 LAB — CALCIUM, IONIZED: Calcium, Ionized, Serum: 4 mg/dL — ABNORMAL LOW (ref 4.5–5.6)

## 2021-03-04 IMAGING — MR MR HEAD W/O CM
6 of 10 series · 27 of 48 positions shown · non-contrast
Comparison: [DATE]

CLINICAL DATA: Mental status change, unknown cause; KASARAGOD arrest

EXAM:
MRI HEAD WITHOUT CONTRAST
TECHNIQUE: Multiplanar, multiecho pulse sequences of the brain and surrounding
structures were obtained without intravenous contrast.

[Series 2: DWI · axial · 3.0mm · 0.94mm/px · z∈[-81,+77]mm · 8 of 108 slices shown (1 of 2)]
[im 1/108]
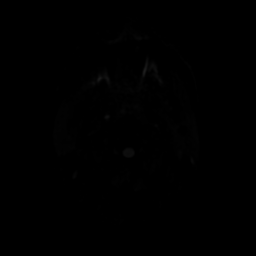
[im 12/108]
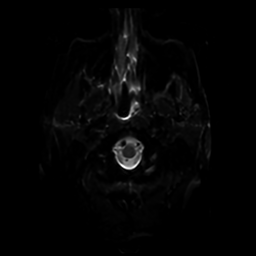
[im 36/108]
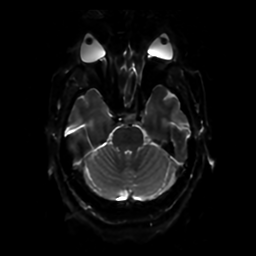
[im 48/108]
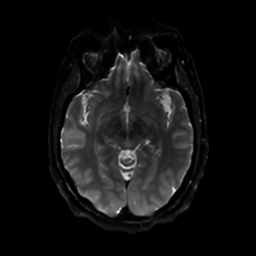
[im 60/108]
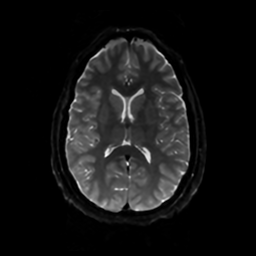
[im 72/108]
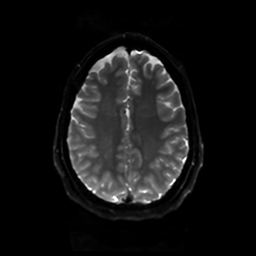
[im 96/108]
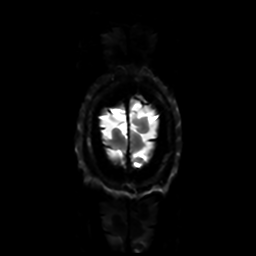
[im 108/108]
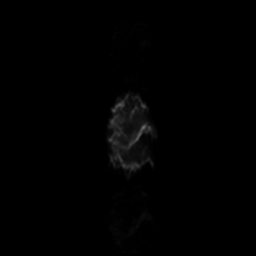

[Series 3: DWI · coronal · 4.0mm · 0.94mm/px · 6 of 73 slices shown (2 of 2)]
[im 1/73]
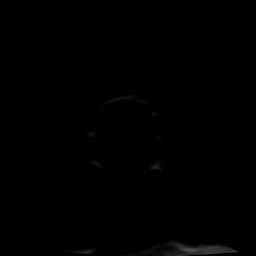
[im 15/73]
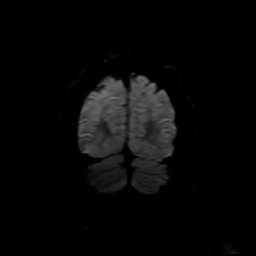
[im 29/73]
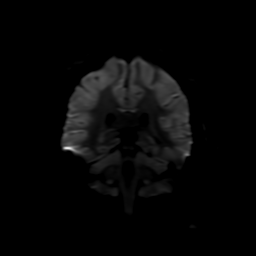
[im 44/73]
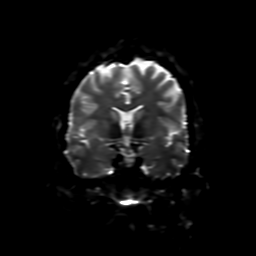
[im 58/73]
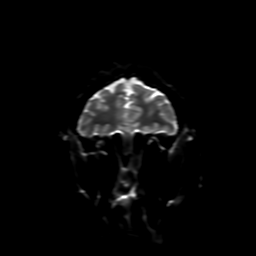
[im 73/73]
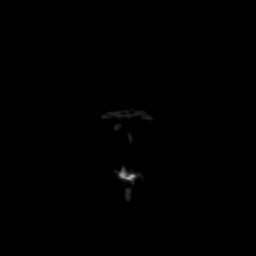

[Series 4: FLAIR · sagittal · 5.0mm · 0.23mm/px · 2 of 23 slices shown (1 of 2)]
[im 1/23]
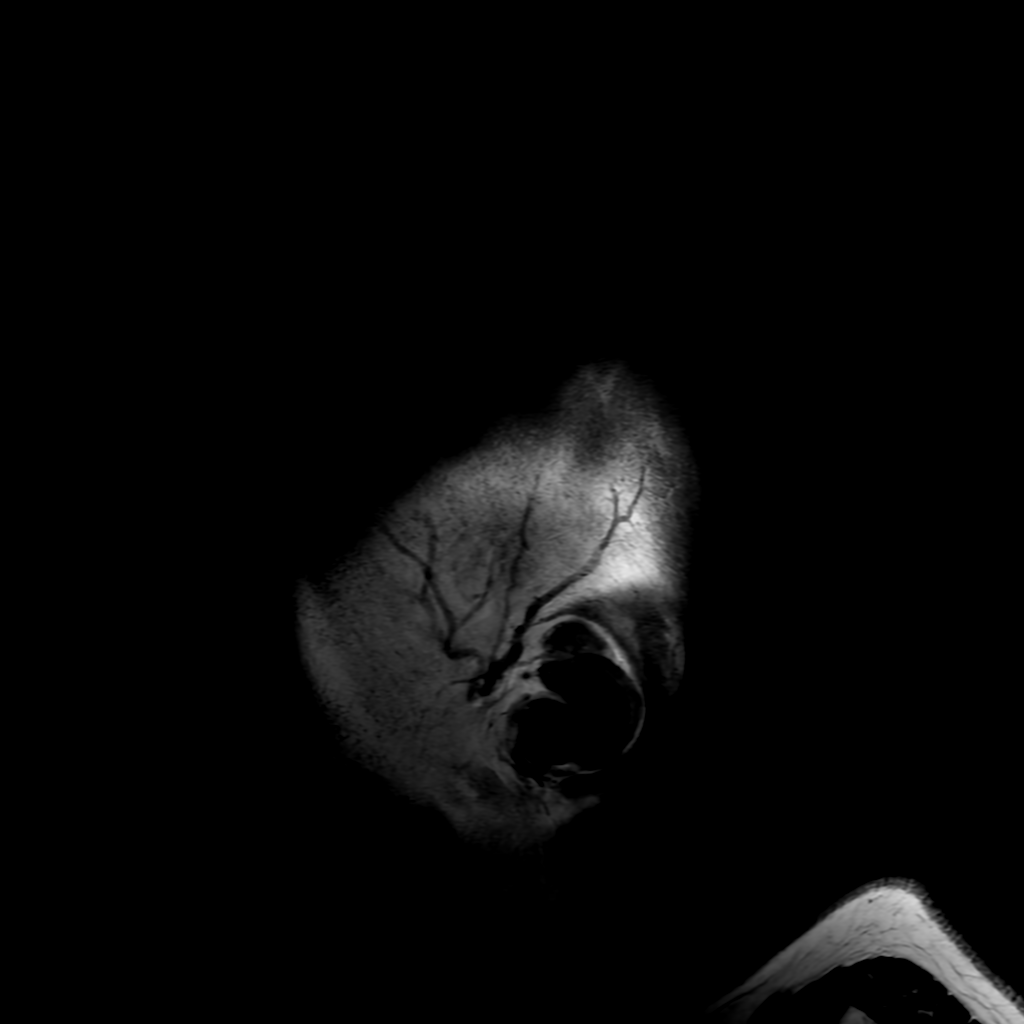
[im 23/23]
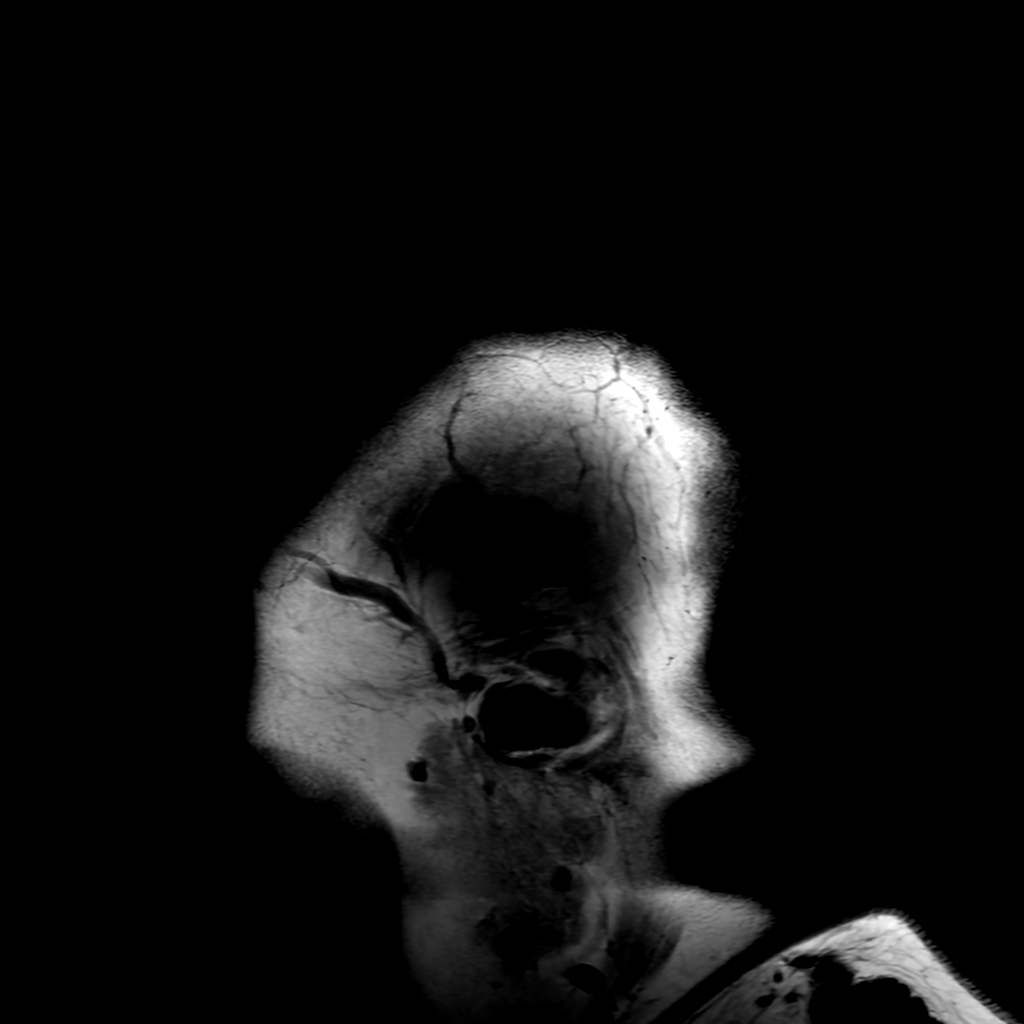

[Series 6: FLAIR · axial · 4.0mm · 0.45mm/px · z∈[-81,+77]mm · 3 of 37 slices shown (2 of 2)]
[im 1/37]
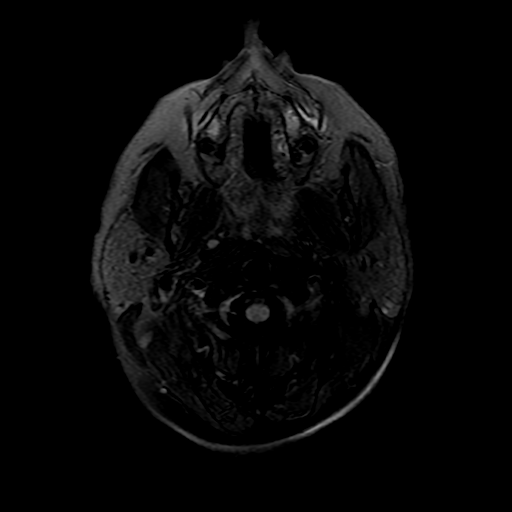
[im 19/37]
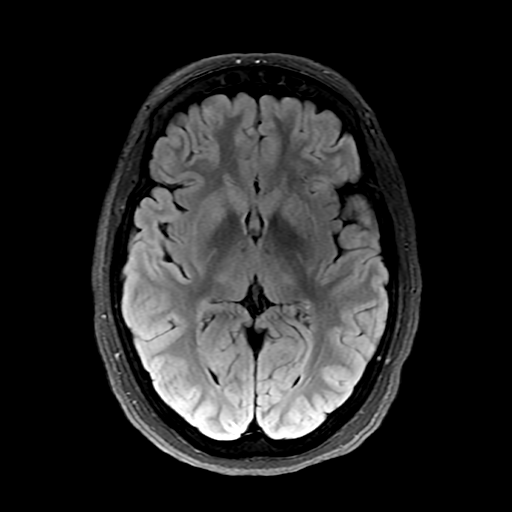
[im 37/37]
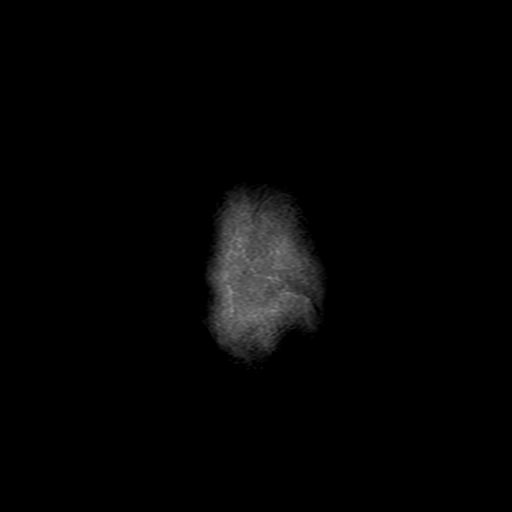

[Series 250: ADC · axial · 3.0mm · 0.94mm/px · z∈[-81,+77]mm · 5 of 54 slices shown (1 of 2)]
[im 1/54]
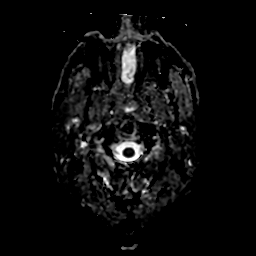
[im 14/54]
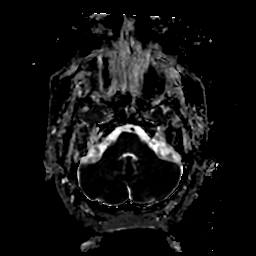
[im 27/54]
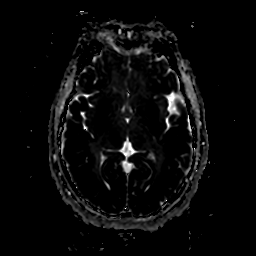
[im 40/54]
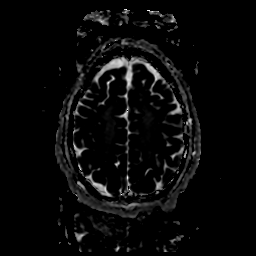
[im 54/54]
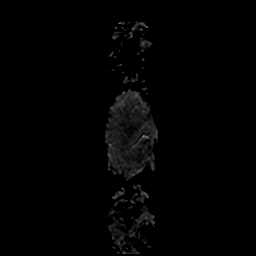

[Series 350: ADC · coronal · 4.0mm · 0.94mm/px · 3 of 37 slices shown (2 of 2)]
[im 1/37]
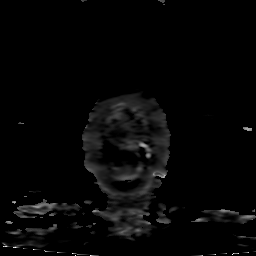
[im 19/37]
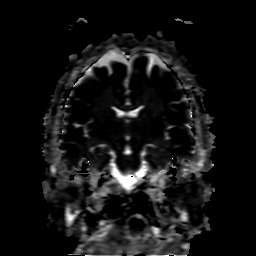
[im 37/37]
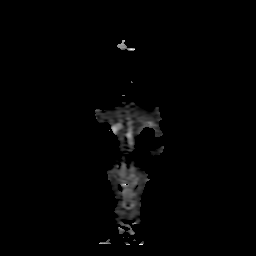

[27 of 48 positions shown; findings below may reference images not displayed]

FINDINGS: Brain: There is abnormal cortical reduced diffusion throughout the
cerebrum including the hippocampi. There is also abnormal diffusion
signal involving the central gray nuclei. There is also likely
involvement of the cerebellum.

Ventricles and sulci are normal in size and configuration though
less prominent than in [DATE], which may reflect presence of
cerebral edema. No evidence of recent hemorrhage.

Vascular: Major vessel flow voids at the skull base are preserved.

Skull and upper cervical spine: Normal marrow signal is preserved.

Sinuses/Orbits: Nonspecific paranasal sinus mucosal thickening.
Orbits are unremarkable.

Other: Sella is unremarkable.  Nonspecific mastoid effusions.
IMPRESSION: Extensive hypoxic/ischemic injury.

## 2021-03-04 MED ORDER — MORPHINE SULFATE (PF) 2 MG/ML IV SOLN
2.0000 mg | INTRAVENOUS | Status: DC | PRN
  Administered 2021-03-04: 2 mg via INTRAVENOUS

## 2021-03-04 MED ORDER — DEXTROSE 5 % IV SOLN: INTRAVENOUS | Status: DC

## 2021-03-04 MED ORDER — MORPHINE BOLUS VIA INFUSION
5.0000 mg | INTRAVENOUS | Status: DC | PRN
  Administered 2021-03-04 – 2021-03-05 (×3): 5 mg via INTRAVENOUS
  Filled 2021-03-04: qty 5

## 2021-03-04 MED ORDER — ACETAMINOPHEN 325 MG PO TABS: 650.0000 mg | ORAL_TABLET | Freq: Four times a day (QID) | ORAL | Status: DC | PRN

## 2021-03-04 MED ORDER — GLYCOPYRROLATE 0.2 MG/ML IJ SOLN: 0.2000 mg | INTRAMUSCULAR | Status: DC | PRN

## 2021-03-04 MED ORDER — POLYVINYL ALCOHOL 1.4 % OP SOLN
1.0000 [drp] | Freq: Four times a day (QID) | OPHTHALMIC | Status: DC | PRN
Start: 2021-03-04 — End: 2021-03-05
  Filled 2021-03-04: qty 15

## 2021-03-04 MED ORDER — ACETAMINOPHEN 650 MG RE SUPP: 650.0000 mg | Freq: Four times a day (QID) | RECTAL | Status: DC | PRN

## 2021-03-04 MED ORDER — GLYCOPYRROLATE 1 MG PO TABS: 1.0000 mg | ORAL_TABLET | ORAL | Status: DC | PRN

## 2021-03-04 MED ORDER — MORPHINE 100MG IN NS 100ML (1MG/ML) PREMIX INFUSION
0.0000 mg/h | INTRAVENOUS | Status: DC
  Administered 2021-03-04: 5 mg/h via INTRAVENOUS
  Administered 2021-03-05 (×2): 20 mg/h via INTRAVENOUS
  Administered 2021-03-05: 15 mg/h via INTRAVENOUS
  Filled 2021-03-04 (×4): qty 100

## 2021-03-04 MED ORDER — DIPHENHYDRAMINE HCL 50 MG/ML IJ SOLN: 25.0000 mg | INTRAMUSCULAR | Status: DC | PRN

## 2021-03-04 NOTE — Progress Notes (Signed)
RT transported patient from Jamie Mercado to CT and back without any complications.

## 2021-03-04 NOTE — Progress Notes (Signed)
Spoke with Jamie Mercado at [facility] [employer] about family wanting to go comfort at this time.  Stated that he would send someone to speak with family.

## 2021-03-04 NOTE — Progress Notes (Signed)
Chaplain Melvenia Beam responded to page for spiritual and emotional care for patient's family. Patient has transitioned to comfort measures this evening. Upon arrival met patient's daughter, mother, step-father, and sister at patient's bed side. Family requested reading of Scripture and prayer for comfort during their time of grief. Mother shared that the family is [place] and had strong relationship with God. Mother stated that her daughter had been "saved." Per Mother's request read Psalm 16 for family and offered prayers for comfort during their time of grief.

## 2021-03-04 NOTE — Progress Notes (Signed)
LTM discontinued at bedside. No skin breakdown noted. Results pending.  °

## 2021-03-04 NOTE — Procedures (Signed)
EEG Procedure CPT/Type of Study: 00762; 24hr EEG with video Referring Provider: Dewald Primary Neurological Diagnosis: cardiac arrest  History: This is a 61 yr old patient, undergoing an EEG to evaluate for myoclonus post cardiac arrest. Clinical State: obtunded  Technical Description:  The EEG was performed using standard setting per the guidelines of American Clinical Neurophysiology Society (ACNS).  A minimum of 21 electrodes were placed on scalp according to the International 10-20 or/and 10-10 Systems. Supplemental electrodes were placed as needed. Single EKG electrode was also used to detect cardiac arrhythmia. Patient's behavior was continuously recorded on video simultaneously with EEG. A minimum of 16 channels were used for data display. Each epoch of study was reviewed manually daily and as needed using standard referential and bipolar montages. Computerized quantitative EEG analysis (such as compressed spectral array analysis, trending, automated spike & seizure detection) were used as indicated.   Day 2: from 0730 03/03/21 to 0730 03/04/21  EEG Description: Overall Amplitude: low/suppressed Predominant Frequency: The background activity showed background suppression initially with burst-suppression patterns with bursts of high voltage spiky 4hz  activity. Overnight there was more background continuity with irregular 1-4Hz  activity. The background was symmetric  Background Abnormalities: suppression as above, resolving Rhythmic or periodic pattern: No Epileptiform activity: Yes; spiky morphology to bursts, improved Electrographic seizures: no Events: Yes; multiple events of facial and neck myoclonus reported but without EEG changes. There was irregular background slowing only with superimposed EMG artifact.   Breach rhythm: no  Reactivity: absent  Stimulation procedures:  Hyperventilation: not done Photic stimulation: not done  Sleep Background: Stage I  EKG:no  significant arrhythmia  Impression: This was an abnormal continuous video EEG due to diffuse slowing, resolving burst-suppression, and absent reactivity. Events of neck/facial myoclonus did not appear to have EEG correlate. This study was indicative of a severe encephalopathy pattern, however background activity has shown mild improvement over the past 24 hours.

## 2021-03-04 NOTE — Progress Notes (Signed)
[facility] Progress Note Patient Name: Dore Oquin DOB: 1960-04-01 MRN: [id number]   Date of Service  03/04/2021  HPI/Events of Note  Cervical spine CT Scan reveals degenerative changes without acute abnormality.  eICU Interventions  Continue present management.      Intervention Category Major Interventions: Other:  Lysle Dingwall 03/04/2021, 5:13 AM

## 2021-03-04 NOTE — Progress Notes (Signed)
Subjective: No jerking per RN, but there is subtle intermittent twitching along the left side of the patient's neck anterolaterally.   Objective: Current vital signs: BP 122/75    Pulse 84    Temp 99 F (37.2 C)    Resp (!) 32    Ht 5' 6" (1.676 m)    Wt 77 kg    SpO2 100%    BMI 27.40 kg/m  Vital signs in last 24 hours: Temp:  [94.8 F (34.9 C)-99.1 F (37.3 C)] 99 F (37.2 C) (01/15 0615) Pulse Rate:  [60-85] 84 (01/15 0615) Resp:  [31-32] 32 (01/15 0615) BP: (92-130)/(62-86) 122/75 (01/15 0615) SpO2:  [99 %-100 %] 100 % (01/15 0615) FiO2 (%):  [65 %-80 %] 65 % (01/15 0336) Weight:  [77 kg] 77 kg (01/15 0500)  Intake/Output from previous day: 01/14 0701 - 01/15 0700 In: 1179.9 [I.V.:629.6; NG/GT:150; IV Piggyback:400.3] Out: 1970 [ONGEX:5284; Emesis/NG output:900] Intake/Output this shift: No intake/output data recorded. Nutritional status:  Diet Order             Diet NPO time specified  Diet effective now                  HEENT: [place]/AT. EEG leads in place.  Lungs: Intubated Ext: No edema   Neurological Exam: Examined while on low-dose propofol gtt.  Ment: Intubated and sedated. Does not react to any external stimuli. Not responding to name or commands. No spontaneous movements other than stereotyped nonrhythmic eye opening consistent with myoclonic activity occurring a few times during the exam; the frequency of the eye movements has significantly decreased since yesterday' exam.  CN: Pupils are unequal with left 1-2 mm  and right 2-3 mm with minimal to no reaction to light. No blink to threat. Eyes are supraducted at the midline. No oculocephalic reflex. No corneal reflex on left, minimal/subtle corneal reflex on the right. No cough and gag reflexes are present today. Face is flaccidly symmetric.  Motor/Sensory: No spontaneous limb jerking seen. Tone is decreased x 4. No posturing or other movement to noxious.  Reflexes: Hypoactive x 4 Cerebellar/Gait: Unable to  assess  Lab Results: Results for orders placed or performed during the hospital encounter of 03/20/2021 (from the past 48 hour(s))  CBC with Differential     Status: Abnormal   Collection Time: 02/22/2021  8:20 PM  Result Value Ref Range   WBC 20.9 (H) 4.0 - 10.5 K/uL   RBC 5.17 (H) 3.87 - 5.11 MIL/uL   Hemoglobin 14.9 12.0 - 15.0 g/dL   HCT 46.9 (H) 36.0 - 46.0 %   MCV 90.7 80.0 - 100.0 fL   MCH 28.8 26.0 - 34.0 pg   MCHC 31.8 30.0 - 36.0 g/dL   RDW 13.4 11.5 - 15.5 %   Platelets 235 150 - 400 K/uL   nRBC 0.4 (H) 0.0 - 0.2 %   Neutrophils Relative % 46 %   Neutro Abs 9.6 (H) 1.7 - 7.7 K/uL   Lymphocytes Relative 46 %   Lymphs Abs 9.6 (H) 0.7 - 4.0 K/uL   Monocytes Relative 4 %   Monocytes Absolute 0.8 0.1 - 1.0 K/uL   Eosinophils Relative 4 %   Eosinophils Absolute 0.8 (H) 0.0 - 0.5 K/uL   Basophils Relative 0 %   Basophils Absolute 0.0 0.0 - 0.1 K/uL   WBC Morphology See Note     Comment: Moderate Left Shift. >5% Metas and Myelos, Occ Pro Noted.   nRBC 0 0 /100  WBC   Abs Immature Granulocytes 0.00 0.00 - 0.07 K/uL    Comment: Performed at [facility], [place] [address] [place], [place] [address]  Comprehensive metabolic panel     Status: Abnormal   Collection Time: 03/10/2021  8:20 PM  Result Value Ref Range   Sodium 137 135 - 145 mmol/L   Potassium 3.5 3.5 - 5.1 mmol/L   Chloride 103 98 - 111 mmol/L   CO2 17 (L) 22 - 32 mmol/L   Glucose, Bld 404 (H) 70 - 99 mg/dL    Comment: Glucose reference range applies only to samples taken after fasting for at least 8 hours.   BUN 10 6 - 20 mg/dL   Creatinine, Ser 1.03 (H) 0.44 - 1.00 mg/dL   Calcium 8.6 (L) 8.9 - 10.3 mg/dL   Total Protein 6.4 (L) 6.5 - 8.1 g/dL   Albumin 3.7 3.5 - 5.0 g/dL   AST 295 (H) 15 - 41 U/L   ALT 309 (H) 0 - 44 U/L   Alkaline Phosphatase 99 38 - 126 U/L   Total Bilirubin 0.6 0.3 - 1.2 mg/dL   GFR, Estimated >60 >60 mL/min    Comment: (NOTE) Calculated using the CKD-EPI Creatinine Equation (2021)     Anion gap 17 (H) 5 - 15    Comment: Performed at [facility], [place] [address] [place], [place] [address]  Lactic acid, plasma     Status: Abnormal   Collection Time: 02/28/2021  8:20 PM  Result Value Ref Range   Lactic Acid, Venous >9.0 (HH) 0.5 - 1.9 mmol/L    Comment: CRITICAL RESULT CALLED TO, READ BACK BY AND VERIFIED WITH: JASPER [facility] 02/27/2021 2116 WAYK Performed at [facility], [place] [address] [place], [place] [address]   Troponin I (High Sensitivity)     Status: Abnormal   Collection Time: 02/18/2021  8:20 PM  Result Value Ref Range   Troponin I (High Sensitivity) 182 (HH) <18 ng/L    Comment: CRITICAL RESULT CALLED TO, READ BACK BY AND VERIFIED WITH: JASPER A,RN 02/20/2021 2203 WAYK (NOTE) Elevated high sensitivity troponin I (hsTnI) values and significant  changes across serial measurements may suggest ACS but many other  chronic and acute conditions are known to elevate hsTnI results.  Refer to the Links section for chest pain algorithms and additional  guidance. Performed at [facility], [place] [address] [place], [place] [address]   Ethanol     Status: Abnormal   Collection Time: 03/20/2021  8:20 PM  Result Value Ref Range   Alcohol, Ethyl (B) 82 (H) <10 mg/dL    Comment: (NOTE) Lowest detectable limit for serum alcohol is 10 mg/dL.  For medical purposes only. Performed at [facility], [place] [address] [place], [place] [address]   Salicylate level     Status: Abnormal   Collection Time: 02/22/2021  8:20 PM  Result Value Ref Range   Salicylate Lvl <4.4 (L) 7.0 - 30.0 mg/dL    Comment: Performed at [facility] [address], [place], [place] [address]  Magnesium     Status: Abnormal   Collection Time: 03/04/2021  8:20 PM  Result Value Ref Range   Magnesium 2.9 (H) 1.7 - 2.4 mg/dL    Comment: Performed at [facility] [address] [place], [place] [address]  TSH     Status: Abnormal   Collection Time: 02/26/2021  8:20 PM   Result Value  Ref Range   TSH 33.236 (H) 0.350 - 4.500 uIU/mL    Comment: Performed by a 3rd Generation assay with a functional sensitivity of <=0.01 uIU/mL. Performed at [facility], [place] [address], [place], [place] [address]   Resp Panel by RT-PCR (Flu A&B, Covid) Nasopharyngeal Swab     Status: None   Collection Time: 02/20/2021  8:31 PM   Specimen: Nasopharyngeal Swab; Nasopharyngeal(NP) swabs in vial transport medium  Result Value Ref Range   SARS Coronavirus 2 by RT PCR NEGATIVE NEGATIVE    Comment: (NOTE) SARS-CoV-2 target nucleic acids are NOT DETECTED.  The SARS-CoV-2 RNA is generally detectable in upper respiratory specimens during the acute phase of infection. The lowest concentration of SARS-CoV-2 viral copies this assay can detect is 138 copies/mL. A negative result does not preclude SARS-Cov-2 infection and should not be used as the sole basis for treatment or other patient management decisions. A negative result may occur with  improper specimen collection/handling, submission of specimen other than nasopharyngeal swab, presence of viral mutation(s) within the areas targeted by this assay, and inadequate number of viral copies(<138 copies/mL). A negative result must be combined with clinical observations, patient history, and epidemiological information. The expected result is Negative.  Fact Sheet for Patients:  EntrepreneurPulse.com.au  Fact Sheet for Healthcare Providers:  IncredibleEmployment.be  This test is no t yet approved or cleared by the [place] FDA and  has been authorized for detection and/or diagnosis of SARS-CoV-2 by FDA under an Emergency Use Authorization (EUA). This EUA will remain  in effect (meaning this test can be used) for the duration of the COVID-19 declaration under Section 564(b)(1) of the Act, 21 U.S.C.section 360bbb-3(b)(1), unless the authorization is terminated  or revoked sooner.        Influenza A by PCR NEGATIVE NEGATIVE   Influenza B by PCR NEGATIVE NEGATIVE    Comment: (NOTE) The Xpert Xpress SARS-CoV-2/FLU/RSV plus assay is intended as an aid in the diagnosis of influenza from Nasopharyngeal swab specimens and should not be used as a sole basis for treatment. Nasal washings and aspirates are unacceptable for Xpert Xpress SARS-CoV-2/FLU/RSV testing.  Fact Sheet for Patients: EntrepreneurPulse.com.au  Fact Sheet for Healthcare Providers: IncredibleEmployment.be  This test is not yet approved or cleared by the [place] FDA and has been authorized for detection and/or diagnosis of SARS-CoV-2 by FDA under an Emergency Use Authorization (EUA). This EUA will remain in effect (meaning this test can be used) for the duration of the COVID-19 declaration under Section 564(b)(1) of the Act, 21 U.S.C. section 360bbb-3(b)(1), unless the authorization is terminated or revoked.  Performed at [facility], [place] [address], [place], [place] [address]   I-Stat arterial blood gas, ED     Status: Abnormal   Collection Time: 03/03/2021  8:42 PM  Result Value Ref Range   pH, Arterial 7.068 (LL) 7.350 - 7.450   pCO2 arterial 50.5 (H) 32.0 - 48.0 mmHg   pO2, Arterial 308 (H) 83.0 - 108.0 mmHg   Bicarbonate 14.6 (L) 20.0 - 28.0 mmol/L   TCO2 16 (L) 22 - 32 mmol/L   O2 Saturation 100.0 %   Acid-base deficit 16.0 (H) 0.0 - 2.0 mmol/L   Sodium 136 135 - 145 mmol/L   Potassium 3.3 (L) 3.5 - 5.1 mmol/L   Calcium, Ion 1.11 (L) 1.15 - 1.40 mmol/L   HCT 42.0 36.0 - 46.0 %   Hemoglobin 14.3 12.0 - 15.0 g/dL   Patient temperature 98.6  F    Collection site RADIAL, ALLEN'S TEST ACCEPTABLE    Drawn by Operator    Sample type ARTERIAL    Comment NOTIFIED PHYSICIAN   Culture, Respiratory w Gram Stain (tracheal aspirate)     Status: None (Preliminary result)   Collection Time: 03/06/2021 10:09 PM   Specimen: Tracheal Aspirate; Respiratory   Result Value Ref Range   Specimen Description TRACHEAL ASPIRATE    Special Requests NONE    Gram Stain      NO SQUAMOUS EPITHELIAL CELLS SEEN FEW WBC SEEN NO ORGANISMS SEEN Performed at [facility], [address], [place], [place] [address]    Culture PENDING    Report Status PENDING   Hemoglobin A1c     Status: Abnormal   Collection Time: 03/01/2021 10:12 PM  Result Value Ref Range   Hgb A1c MFr Bld 6.0 (H) 4.8 - 5.6 %    Comment: (NOTE) Pre diabetes:          5.7%-6.4%  Diabetes:              >6.4%  Glycemic control for   <7.0% adults with diabetes    Mean Plasma Glucose 125.5 mg/dL    Comment: Performed at [facility] [address], [place], [place] [address]  Culture, blood (routine x 2)     Status: None (Preliminary result)   Collection Time: 02/27/2021 10:14 PM   Specimen: BLOOD RIGHT HAND  Result Value Ref Range   Specimen Description BLOOD RIGHT HAND    Special Requests      BOTTLES DRAWN AEROBIC AND ANAEROBIC Blood Culture adequate volume   Culture      NO GROWTH < 24 HOURS Performed at [facility], [place] [address] [place], [place] [address]    Report Status PENDING   Ammonia     Status: Abnormal   Collection Time: 03/13/2021 10:22 PM  Result Value Ref Range   Ammonia 48 (H) 9 - 35 umol/L    Comment: Performed at [facility], [place] [address], [place], [place] [address]  Lactic acid, plasma     Status: Abnormal   Collection Time: 03/13/2021 10:22 PM  Result Value Ref Range   Lactic Acid, Venous 7.6 (HH) 0.5 - 1.9 mmol/L    Comment: CRITICAL VALUE NOTED.  VALUE IS CONSISTENT WITH PREVIOUSLY REPORTED AND CALLED VALUE. Performed at [facility], [place] [address], [place], [place] [address]   HIV Antibody (routine testing w rflx)     Status: None   Collection Time: 03/17/2021 10:22 PM  Result Value Ref Range   HIV Screen 4th Generation wRfx Non Reactive Non Reactive    Comment: Performed at [facility], [place] [address],  [place], [place] [address]  I-Stat arterial blood gas, ED     Status: Abnormal   Collection Time: 03/08/2021 11:03 PM  Result Value Ref Range   pH, Arterial 7.152 (LL) 7.350 - 7.450   pCO2 arterial 51.7 (H) 32.0 - 48.0 mmHg   pO2, Arterial 78 (L) 83.0 - 108.0 mmHg   Bicarbonate 18.2 (L) 20.0 - 28.0 mmol/L   TCO2 20 (L) 22 - 32 mmol/L   O2 Saturation 91.0 %   Acid-base deficit 11.0 (H) 0.0 - 2.0 mmol/L   Sodium 141 135 - 145 mmol/L   Potassium 3.2 (L) 3.5 - 5.1 mmol/L   Calcium, Ion 1.11 (L) 1.15 - 1.40 mmol/L   HCT 39.0 36.0 - 46.0 %  Hemoglobin 13.3 12.0 - 15.0 g/dL   Patient temperature 97.6 F    Collection site RADIAL, ALLEN'S TEST ACCEPTABLE    Drawn by Operator    Sample type ARTERIAL    Comment NOTIFIED PHYSICIAN   MRSA Next Gen by PCR, Nasal     Status: Abnormal   Collection Time: 03/10/2021 11:33 PM   Specimen: Nasal Mucosa; Nasal Swab  Result Value Ref Range   MRSA by PCR Next Gen DETECTED (A) NOT DETECTED    Comment: RESULT CALLED TO, READ BACK BY AND VERIFIED WITH: RN MICHELLE T. 03/03/21_0 :28 BY TW (NOTE) The GeneXpert MRSA Assay (FDA approved for NASAL specimens only), is one component of a comprehensive MRSA colonization surveillance program. It is not intended to diagnose MRSA infection nor to guide or monitor treatment for MRSA infections. Test performance is not FDA approved in patients less than 63 years old. Performed at [facility], [place] [address] [place], [place] [address]   Rapid urine drug screen (hospital performed)     Status: Abnormal   Collection Time: 02/24/2021 11:39 PM  Result Value Ref Range   Opiates NONE DETECTED NONE DETECTED   Cocaine POSITIVE (A) NONE DETECTED   Benzodiazepines POSITIVE (A) NONE DETECTED   Amphetamines NONE DETECTED NONE DETECTED   Tetrahydrocannabinol NONE DETECTED NONE DETECTED   Barbiturates NONE DETECTED NONE DETECTED    Comment: (NOTE) DRUG SCREEN FOR MEDICAL PURPOSES ONLY.  IF CONFIRMATION IS NEEDED FOR ANY  PURPOSE, NOTIFY LAB WITHIN 5 DAYS.  LOWEST DETECTABLE LIMITS FOR URINE DRUG SCREEN Drug Class                     Cutoff (ng/mL) Amphetamine and metabolites    1000 Barbiturate and metabolites    200 Benzodiazepine                 485 Tricyclics and metabolites     300 Opiates and metabolites        300 Cocaine and metabolites        300 THC                            50 Performed at [facility], [place] [address], [place], [place] [address]   Urinalysis, Routine w reflex microscopic Urine, Catheterized     Status: Abnormal   Collection Time: 03/11/2021 11:39 PM  Result Value Ref Range   Color, Urine YELLOW YELLOW   APPearance CLEAR CLEAR   Specific Gravity, Urine 1.015 1.005 - 1.030   pH 6.0 5.0 - 8.0   Glucose, UA >=500 (A) NEGATIVE mg/dL   Hgb urine dipstick MODERATE (A) NEGATIVE   Bilirubin Urine NEGATIVE NEGATIVE   Ketones, ur NEGATIVE NEGATIVE mg/dL   Protein, ur 30 (A) NEGATIVE mg/dL   Nitrite NEGATIVE NEGATIVE   Leukocytes,Ua NEGATIVE NEGATIVE    Comment: Performed at [facility] [address], [place], [place] [address]  Urinalysis, Microscopic (reflex)     Status: Abnormal   Collection Time: 02/27/2021 11:39 PM  Result Value Ref Range   RBC / HPF 0-5 0 - 5 RBC/hpf   WBC, UA 0-5 0 - 5 WBC/hpf   Bacteria, UA RARE (A) NONE SEEN   Squamous Epithelial / LPF 0-5 0 - 5   Mucus PRESENT     Comment: Performed at [facility], [place] [address] [place], [place] [address]  Troponin I (High Sensitivity)  Status: Abnormal   Collection Time: 02/22/2021 11:41 PM  Result Value Ref Range   Troponin I (High Sensitivity) 2,402 (HH) <18 ng/L    Comment: CRITICAL VALUE NOTED.  VALUE IS CONSISTENT WITH PREVIOUSLY REPORTED AND CALLED VALUE. (NOTE) Elevated high sensitivity troponin I (hsTnI) values and significant  changes across serial measurements may suggest ACS but many other  chronic and acute conditions are known to elevate hsTnI results.  Refer to the Links  section for chest pain algorithms and additional  guidance. Performed at [facility], [place] [address], [place], [place] [address]   CBC     Status: Abnormal   Collection Time: 03/11/2021 11:41 PM  Result Value Ref Range   WBC 22.2 (H) 4.0 - 10.5 K/uL   RBC 4.81 3.87 - 5.11 MIL/uL   Hemoglobin 13.6 12.0 - 15.0 g/dL   HCT 42.7 36.0 - 46.0 %   MCV 88.8 80.0 - 100.0 fL   MCH 28.3 26.0 - 34.0 pg   MCHC 31.9 30.0 - 36.0 g/dL   RDW 13.5 11.5 - 15.5 %   Platelets 307 150 - 400 K/uL   nRBC 0.0 0.0 - 0.2 %    Comment: Performed at [facility], [place] [address] [place], [place] [address]  Creatinine, serum     Status: Abnormal   Collection Time: 03/20/2021 11:41 PM  Result Value Ref Range   Creatinine, Ser 1.06 (H) 0.44 - 1.00 mg/dL   GFR, Estimated >60 >60 mL/min    Comment: (NOTE) Calculated using the CKD-EPI Creatinine Equation (2021) Performed at [facility] [address], [place],  [address]   Procalcitonin     Status: None   Collection Time: 02/28/2021 11:41 PM  Result Value Ref Range   Procalcitonin 0.56 ng/mL    Comment:        Interpretation: PCT > 0.5 ng/mL and <= 2 ng/mL: Systemic infection (sepsis) is possible, but other conditions are known to elevate PCT as well. (NOTE)       Sepsis PCT Algorithm           Lower Respiratory Tract                                      Infection PCT Algorithm    ----------------------------     ----------------------------         PCT < 0.25 ng/mL                PCT < 0.10 ng/mL          Strongly encourage             Strongly discourage   discontinuation of antibiotics    initiation of antibiotics    ----------------------------     -----------------------------       PCT 0.25 - 0.50 ng/mL            PCT 0.10 - 0.25 ng/mL               OR       >80% decrease in PCT            Discourage initiation of  antibiotics      Encourage discontinuation           of  antibiotics    ----------------------------     -----------------------------         PCT >= 0.50 ng/mL              PCT 0.26 - 0.50 ng/mL                AND       <80% decrease in PCT             Encourage initiation of                                             antibiotics       Encourage continuation           of antibiotics    ----------------------------     -----------------------------        PCT >= 0.50 ng/mL                  PCT > 0.50 ng/mL               AND         increase in PCT                  Strongly encourage                                      initiation of antibiotics    Strongly encourage escalation           of antibiotics                                     -----------------------------                                           PCT <= 0.25 ng/mL                                                 OR                                        > 80% decrease in PCT                                      Discontinue / Do not initiate                                             antibiotics  Performed at [facility] [address] [place], [place] [address]   CBC     Status: Abnormal   Collection Time:  03/03/21 12:51 AM  Result Value Ref Range   WBC 24.0 (H) 4.0 - 10.5 K/uL   RBC 4.67 3.87 - 5.11 MIL/uL   Hemoglobin 13.2 12.0 - 15.0 g/dL   HCT 41.7 36.0 - 46.0 %   MCV 89.3 80.0 - 100.0 fL   MCH 28.3 26.0 - 34.0 pg   MCHC 31.7 30.0 - 36.0 g/dL   RDW 13.7 11.5 - 15.5 %   Platelets 302 150 - 400 K/uL   nRBC 0.0 0.0 - 0.2 %    Comment: Performed at [facility], [place] [address] [place], [place] [address]  Comprehensive metabolic panel     Status: Abnormal   Collection Time: 03/03/21 12:51 AM  Result Value Ref Range   Sodium 143 135 - 145 mmol/L   Potassium 3.6 3.5 - 5.1 mmol/L   Chloride 110 98 - 111 mmol/L   CO2 17 (L) 22 - 32 mmol/L   Glucose, Bld 259 (H) 70 - 99 mg/dL    Comment: Glucose reference range applies only to samples taken  after fasting for at least 8 hours.   BUN 11 6 - 20 mg/dL   Creatinine, Ser 1.01 (H) 0.44 - 1.00 mg/dL   Calcium 7.6 (L) 8.9 - 10.3 mg/dL   Total Protein 5.4 (L) 6.5 - 8.1 g/dL   Albumin 3.0 (L) 3.5 - 5.0 g/dL   AST QUANTITY NOT SUFFICIENT, UNABLE TO PERFORM TEST 15 - 41 U/L   ALT QUANTITY NOT SUFFICIENT, UNABLE TO PERFORM TEST 0 - 44 U/L   Alkaline Phosphatase 74 38 - 126 U/L   Total Bilirubin QUANTITY NOT SUFFICIENT, UNABLE TO PERFORM TEST 0.3 - 1.2 mg/dL   GFR, Estimated >60 >60 mL/min    Comment: (NOTE) Calculated using the CKD-EPI Creatinine Equation (2021)    Anion gap 16 (H) 5 - 15    Comment: Performed at [facility], [address] [place], [place] [address]  Glucose, capillary     Status: Abnormal   Collection Time: 03/03/21 12:51 AM  Result Value Ref Range   Glucose-Capillary 231 (H) 70 - 99 mg/dL    Comment: Glucose reference range applies only to samples taken after fasting for at least 8 hours.  Lactic acid, plasma     Status: Abnormal   Collection Time: 03/03/21  1:02 AM  Result Value Ref Range   Lactic Acid, Venous 5.2 (HH) 0.5 - 1.9 mmol/L    Comment: CRITICAL VALUE NOTED.  VALUE IS CONSISTENT WITH PREVIOUSLY REPORTED AND CALLED VALUE. Performed at [facility], [place] [address], [place], [place] [address]   T4, free     Status: None   Collection Time: 03/03/21  1:02 AM  Result Value Ref Range   Free T4 0.73 0.61 - 1.12 ng/dL    Comment: (NOTE) Biotin ingestion may interfere with free T4 tests. If the results are inconsistent with the TSH level, previous test results, or the clinical presentation, then consider biotin interference. If needed, order repeat testing after stopping biotin. Performed at [facility], [place] [address], [place], [place] [address]   Glucose, capillary     Status: Abnormal   Collection Time: 03/03/21  3:11 AM  Result Value Ref Range   Glucose-Capillary 220 (H) 70 - 99 mg/dL    Comment: Glucose reference range  applies only to samples taken after fasting for at least 8 hours.  Triglycerides     Status: Abnormal   Collection Time: 03/03/21  3:15 AM  Result Value Ref Range   Triglycerides 170 (H) <150 mg/dL    Comment: Performed at [facility] [address] [place], [place] [address]  I-STAT 7, (LYTES, BLD GAS, ICA, H+H)     Status: Abnormal   Collection Time: 03/03/21  4:04 AM  Result Value Ref Range   pH, Arterial 7.221 (L) 7.350 - 7.450   pCO2 arterial 43.1 32.0 - 48.0 mmHg   pO2, Arterial 146 (H) 83.0 - 108.0 mmHg   Bicarbonate 18.3 (L) 20.0 - 28.0 mmol/L   TCO2 20 (L) 22 - 32 mmol/L   O2 Saturation 99.0 %   Acid-base deficit 10.0 (H) 0.0 - 2.0 mmol/L   Sodium 141 135 - 145 mmol/L   Potassium 3.3 (L) 3.5 - 5.1 mmol/L   Calcium, Ion 1.13 (L) 1.15 - 1.40 mmol/L   HCT 39.0 36.0 - 46.0 %   Hemoglobin 13.3 12.0 - 15.0 g/dL   Patient temperature 34.4 C    Collection site art line    Drawn by RT    Sample type ARTERIAL    Comment NOTIFIED PHYSICIAN   ALT     Status: Abnormal   Collection Time: 03/03/21  5:29 AM  Result Value Ref Range   ALT 370 (H) 0 - 44 U/L    Comment: Performed at [facility], [place] [address], [place], [place] [address]  AST     Status: Abnormal   Collection Time: 03/03/21  5:29 AM  Result Value Ref Range   AST 494 (H) 15 - 41 U/L    Comment: Performed at [facility], [place] [address], [place], [place] [address]  Bilirubin, total     Status: None   Collection Time: 03/03/21  5:29 AM  Result Value Ref Range   Total Bilirubin 0.5 0.3 - 1.2 mg/dL    Comment: Performed at [facility] [address] [place], [place] [address]  Glucose, capillary     Status: Abnormal   Collection Time: 03/03/21  7:27 AM  Result Value Ref Range   Glucose-Capillary 118 (H) 70 - 99 mg/dL    Comment: Glucose reference range applies only to samples taken after fasting for at least 8 hours.  Glucose, capillary     Status: Abnormal   Collection Time: 03/03/21  11:20 AM  Result Value Ref Range   Glucose-Capillary 101 (H) 70 - 99 mg/dL    Comment: Glucose reference range applies only to samples taken after fasting for at least 8 hours.  Glucose, capillary     Status: None   Collection Time: 03/03/21  3:18 PM  Result Value Ref Range   Glucose-Capillary 80 70 - 99 mg/dL    Comment: Glucose reference range applies only to samples taken after fasting for at least 8 hours.  Glucose, capillary     Status: None   Collection Time: 03/03/21  7:18 PM  Result Value Ref Range   Glucose-Capillary 84 70 - 99 mg/dL    Comment: Glucose reference range applies only to samples taken after fasting for at least 8 hours.  Glucose, capillary     Status: Abnormal   Collection Time: 03/03/21 11:24 PM  Result Value Ref Range   Glucose-Capillary 104 (H) 70 - 99 mg/dL    Comment: Glucose reference range applies only to samples taken after fasting for at least 8 hours.  Glucose, capillary     Status: None   Collection Time: 03/04/21  3:41  AM  Result Value Ref Range   Glucose-Capillary 93 70 - 99 mg/dL    Comment: Glucose reference range applies only to samples taken after fasting for at least 8 hours.  Comprehensive metabolic panel     Status: Abnormal   Collection Time: 03/04/21  5:05 AM  Result Value Ref Range   Sodium 141 135 - 145 mmol/L   Potassium 4.9 3.5 - 5.1 mmol/L    Comment: SLIGHT HEMOLYSIS   Chloride 108 98 - 111 mmol/L   CO2 18 (L) 22 - 32 mmol/L   Glucose, Bld 100 (H) 70 - 99 mg/dL    Comment: Glucose reference range applies only to samples taken after fasting for at least 8 hours.   BUN 15 6 - 20 mg/dL   Creatinine, Ser 0.86 0.44 - 1.00 mg/dL   Calcium 8.6 (L) 8.9 - 10.3 mg/dL   Total Protein 5.8 (L) 6.5 - 8.1 g/dL   Albumin 3.0 (L) 3.5 - 5.0 g/dL   AST 219 (H) 15 - 41 U/L   ALT 261 (H) 0 - 44 U/L   Alkaline Phosphatase 56 38 - 126 U/L   Total Bilirubin 0.9 0.3 - 1.2 mg/dL   GFR, Estimated >60 >60 mL/min    Comment: (NOTE) Calculated using  the CKD-EPI Creatinine Equation (2021)    Anion gap 15 5 - 15    Comment: Performed at [facility] [address] [place], [place] [address]  Ammonia     Status: Abnormal   Collection Time: 03/04/21  5:05 AM  Result Value Ref Range   Ammonia 41 (H) 9 - 35 umol/L    Comment: Performed at [facility], [place] [address] [place], [place] [address]  Triglycerides     Status: Abnormal   Collection Time: 03/04/21  5:05 AM  Result Value Ref Range   Triglycerides 333 (H) <150 mg/dL    Comment: Performed at [facility] [address] [place], [place] [address]  Glucose, capillary     Status: None   Collection Time: 03/04/21  7:30 AM  Result Value Ref Range   Glucose-Capillary 95 70 - 99 mg/dL    Comment: Glucose reference range applies only to samples taken after fasting for at least 8 hours.    Recent Results (from the past 240 hour(s))  Resp Panel by RT-PCR (Flu A&B, Covid) Nasopharyngeal Swab     Status: None   Collection Time: 03/08/2021  8:31 PM   Specimen: Nasopharyngeal Swab; Nasopharyngeal(NP) swabs in vial transport medium  Result Value Ref Range Status   SARS Coronavirus 2 by RT PCR NEGATIVE NEGATIVE Final    Comment: (NOTE) SARS-CoV-2 target nucleic acids are NOT DETECTED.  The SARS-CoV-2 RNA is generally detectable in upper respiratory specimens during the acute phase of infection. The lowest concentration of SARS-CoV-2 viral copies this assay can detect is 138 copies/mL. A negative result does not preclude SARS-Cov-2 infection and should not be used as the sole basis for treatment or other patient management decisions. A negative result may occur with  improper specimen collection/handling, submission of specimen other than nasopharyngeal swab, presence of viral mutation(s) within the areas targeted by this assay, and inadequate number of viral copies(<138 copies/mL). A negative result must be combined with clinical observations, patient history, and  epidemiological information. The expected result is Negative.  Fact Sheet for Patients:  EntrepreneurPulse.com.au  Fact Sheet for Healthcare Providers:  IncredibleEmployment.be  This test is no t yet approved or  cleared by the [place] and  has been authorized for detection and/or diagnosis of SARS-CoV-2 by FDA under an Emergency Use Authorization (EUA). This EUA will remain  in effect (meaning this test can be used) for the duration of the COVID-19 declaration under Section 564(b)(1) of the Act, 21 U.S.C.section 360bbb-3(b)(1), unless the authorization is terminated  or revoked sooner.       Influenza A by PCR NEGATIVE NEGATIVE Final   Influenza B by PCR NEGATIVE NEGATIVE Final    Comment: (NOTE) The Xpert Xpress SARS-CoV-2/FLU/RSV plus assay is intended as an aid in the diagnosis of influenza from Nasopharyngeal swab specimens and should not be used as a sole basis for treatment. Nasal washings and aspirates are unacceptable for Xpert Xpress SARS-CoV-2/FLU/RSV testing.  Fact Sheet for Patients: EntrepreneurPulse.com.au  Fact Sheet for Healthcare Providers: IncredibleEmployment.be  This test is not yet approved or cleared by the [place] FDA and has been authorized for detection and/or diagnosis of SARS-CoV-2 by FDA under an Emergency Use Authorization (EUA). This EUA will remain in effect (meaning this test can be used) for the duration of the COVID-19 declaration under Section 564(b)(1) of the Act, 21 U.S.C. section 360bbb-3(b)(1), unless the authorization is terminated or revoked.  Performed at [facility], [place] [address], [place], [place] [address]   Culture, Respiratory w Gram Stain (tracheal aspirate)     Status: None (Preliminary result)   Collection Time: 02/19/2021 10:09 PM   Specimen: Tracheal Aspirate; Respiratory  Result Value Ref Range Status   Specimen Description  TRACHEAL ASPIRATE  Final   Special Requests NONE  Final   Gram Stain   Final    NO SQUAMOUS EPITHELIAL CELLS SEEN FEW WBC SEEN NO ORGANISMS SEEN Performed at [facility], [address], [place], [place] [address]    Culture PENDING  Incomplete   Report Status PENDING  Incomplete  Culture, blood (routine x 2)     Status: None (Preliminary result)   Collection Time: 03/01/2021 10:14 PM   Specimen: BLOOD RIGHT HAND  Result Value Ref Range Status   Specimen Description BLOOD RIGHT HAND  Final   Special Requests   Final    BOTTLES DRAWN AEROBIC AND ANAEROBIC Blood Culture adequate volume   Culture   Final    NO GROWTH < 24 HOURS Performed at [facility], [place] [address], [place], [place] [address]    Report Status PENDING  Incomplete  MRSA Next Gen by PCR, Nasal     Status: Abnormal   Collection Time: 03/12/2021 11:33 PM   Specimen: Nasal Mucosa; Nasal Swab  Result Value Ref Range Status   MRSA by PCR Next Gen DETECTED (A) NOT DETECTED Final    Comment: RESULT CALLED TO, READ BACK BY AND VERIFIED WITH: RN MICHELLE T. 03/03/21_0 :28 BY TW (NOTE) The GeneXpert MRSA Assay (FDA approved for NASAL specimens only), is one component of a comprehensive MRSA colonization surveillance program. It is not intended to diagnose MRSA infection nor to guide or monitor treatment for MRSA infections. Test performance is not FDA approved in patients less than 67 years old. Performed at [facility], [place] [address], [place], [place] [address]     Lipid Panel Recent Labs    03/04/21 0505  TRIG 333*    Studies/Results: DG Abd 1 View  Result Date: 03/04/2021 CLINICAL DATA:  OG tube placement. EXAM: ABDOMEN - 1 VIEW COMPARISON:  None. FINDINGS: Multiple overlying radiopaque lead wires are  noted. An orogastric tube is seen with its distal tip noted within the body of the stomach. The bowel gas pattern is normal. No radio-opaque calculi or other significant radiographic abnormality  are seen. IMPRESSION: Orogastric tube positioning, as described above. Electronically Signed   By: Virgina Norfolk M.D.   On: 03/04/2021 20:57   CT Head Wo Contrast  Result Date: 03/01/2021 CLINICAL DATA:  Recent arrest with CPR EXAM: CT HEAD WITHOUT CONTRAST TECHNIQUE: Contiguous axial images were obtained from the base of the skull through the vertex without intravenous contrast. RADIATION DOSE REDUCTION: This exam was performed according to the departmental dose-optimization program which includes automated exposure control, adjustment of the mA and/or kV according to patient size and/or use of iterative reconstruction technique. COMPARISON:  MRI from 11/23/2020 FINDINGS: Brain: No evidence of acute infarction, hemorrhage, hydrocephalus, extra-axial collection or mass lesion/mass effect. Vascular: No hyperdense vessel or unexpected calcification. Skull: Normal. Negative for fracture or focal lesion. Sinuses/Orbits: Mucosal thickening in the nasal passages is noted. Paranasal sinuses appear within normal limits. Orbits and their contents are unremarkable. Other: None IMPRESSION: No acute intracranial abnormality noted. Electronically Signed   By: Inez Catalina M.D.   On: 03/08/2021 21:34   CT CERVICAL SPINE WO CONTRAST  Result Date: 03/04/2021 CLINICAL DATA:  History of recent cardiac arrest with fall and neck pain, initial encounter EXAM: CT CERVICAL SPINE WITHOUT CONTRAST TECHNIQUE: Multidetector CT imaging of the cervical spine was performed without intravenous contrast. Multiplanar CT image reconstructions were also generated. RADIATION DOSE REDUCTION: This exam was performed according to the departmental dose-optimization program which includes automated exposure control, adjustment of the mA and/or kV according to patient size and/or use of iterative reconstruction technique. COMPARISON:  None. FINDINGS: Alignment: Within normal limits. Skull base and vertebrae: 7 cervical segments are well  visualized. Vertebral body height is well maintained. Multilevel facet hypertrophic changes are noted. Disc space narrowing is noted at C5-6 and C6-7 with associated osteophytic changes. No acute fracture or acute facet abnormality is noted. The odontoid is within normal limits. Soft tissues and spinal canal: Surrounding soft tissue structures are within normal limits. No focal hematoma is noted. Endotracheal tube and nasogastric catheter are seen. Upper chest: Visualized lung apices are within normal limits. Other: None IMPRESSION: Degenerative changes as described without acute abnormality. Electronically Signed   By: Inez Catalina M.D.   On: 03/04/2021 00:16   DG Chest Port 1 View  Result Date: 03/10/2021 CLINICAL DATA:  Status post CPR. EXAM: PORTABLE CHEST 1 VIEW COMPARISON:  January 25, 2021 FINDINGS: Overlying radiopaque tags and cardiac lead wires are present. An endotracheal tube is seen with its distal tip approximately 4.2 cm from the carina. A nasogastric tube is noted with its distal end extending into the body of the stomach. Its side hole sits approximately 5.6 cm distal to the expected region of the gastroesophageal junction. Mild, diffusely increased lung markings are noted, bilaterally. There is no evidence of focal consolidation, pleural effusion or pneumothorax. The heart size and mediastinal contours are within normal limits. Lateral seventh and ninth left rib fractures are seen. IMPRESSION: 1. Endotracheal tube and nasogastric tube in place as described above. 2. Mild, diffusely increased lung markings, bilaterally, without evidence of focal consolidation or pneumothorax. 3. Lateral seventh and ninth left rib fractures. Electronically Signed   By: Virgina Norfolk M.D.   On: 03/11/2021 20:56   EEG adult  Result Date: 03/03/2021 Lora Havens, MD     03/03/2021  2:59 AM  Patient Name: Brittish Bolinger MRN: [id number] Epilepsy Attending: Lora Havens Referring Physician/Provider:  Amie Portland, MD Date: 03/03/2021 Duration: 24.28 mins Patient history: 61 year old with above past medical history brought in after being found down in a hotel room of unknown duration.  CPR in the field and in the ER for total about 50 minutes duration prior to ROSC.  Right after ROSC, started having what appears to be myoclonic jerking. EEG to evaluate for seizure Level of alertness:  comatose AEDs during EEG study: LEV Technical aspects: This EEG study was done with scalp electrodes positioned according to the 10-20 International system of electrode placement. Electrical activity was acquired at a sampling rate of 500Hz and reviewed with a high frequency filter of 70Hz and a low frequency filter of 1Hz. EEG data were recorded continuously and digitally stored. Description: Patient was noted to have episodes of spontaneous brief eye opening every 2 to 40 seconds . Concomitant eeg showed generalized polyspikes consistent with myoclonic seizure. In between myoclonic seizure, EEG showed continuous generalized background suppression. Hyperventilation and photic stimulation were not performed.   ABNORMALITY - Myoclonic seizure, generalized - EEG suppression, generalized IMPRESSION: This study showed episodes of brief spontaneous eye opening consistent with myoclonic seizure every 2 to 40 seconds. There is also profound diffuse encephalopathy. 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 Dr Rory Percy was notified. Priyanka [name]   Overnight EEG with video  Result Date: 03/03/2021 Lora Havens, MD     03/03/2021  9:43 AM Patient Name: Rashell Shambaugh MRN: [id number] Epilepsy Attending: Lora Havens Referring Physician/Provider: Amie Portland, MD Duration: 03/03/2021 0239 to 0730  Patient history: 61 year old with above past medical history brought in after being found down in a hotel room of unknown duration.  CPR in the field and in the ER for total about 50  minutes duration prior to ROSC.  Right after ROSC, started having what appears to be myoclonic jerking. EEG to evaluate for seizure  Level of alertness: comatose  AEDs during EEG study: LEV, propofol  Technical aspects: This EEG study was done with scalp electrodes positioned according to the 10-20 International system of electrode placement. Electrical activity was acquired at a sampling rate of 500Hz and reviewed with a high frequency filter of 70Hz and a low frequency filter of 1Hz. EEG data were recorded continuously and digitally stored.  Description: At the beginning of study, patient was noted to have episodes of spontaneous brief eye opening every 2 to 40 seconds . Concomitant eeg showed generalized polyspikes consistent with myoclonic seizure. Gradually, myoclonic seizures resolved and eeg showed burst suppression pattern with generalized highly epileptiform bursts lasting 5-15 seconds alternating with about 1 minute of generalized eeg suppression.  Hyperventilation and photic stimulation were not performed.    ABNORMALITY - Myoclonic seizure, generalized - Burst suppression with highly epileptiform discharges, generalized   IMPRESSION: This study initially showed episodes of brief spontaneous eye opening consistent with myoclonic seizure every 2 to 40 seconds. Gradually as medications were adjusted, EEG showed evidence of epileptogenicity with generalized onset and high potential for seizure recurrence. Additionally, EEG was suggestive of  profound diffuse encephalopathy. 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 Priyanka [name]    Medications: Scheduled:  acetaminophen (TYLENOL) oral liquid 160 mg/5 mL  650 mg Per Tube Q4H   Or   acetaminophen  650 mg Rectal Q4H   chlorhexidine gluconate (MEDLINE KIT)  15  mL Mouth Rinse BID   Chlorhexidine Gluconate Cloth  6 each Topical Daily   docusate  100 mg Per Tube BID   folic acid  1 mg Per Tube Daily    heparin  5,000 Units Subcutaneous Q8H   insulin aspart  0-15 Units Subcutaneous Q4H   levETIRAcetam  1,500 mg Per Tube BID   levothyroxine  100 mcg Per Tube Daily   mouth rinse  15 mL Mouth Rinse 10 times per day   multivitamin with minerals  1 tablet Per Tube Daily   mupirocin ointment  1 application Nasal BID   pantoprazole sodium  40 mg Per Tube Daily   polyethylene glycol  17 g Per Tube Daily   thiamine  100 mg Per Tube Daily   valproic acid  500 mg Per Tube Q12H   Continuous:  sodium chloride Stopped (03/03/21 0103)   cefTRIAXone (ROCEPHIN)  IV Stopped (03/03/21 2224)   norepinephrine (LEVOPHED) Adult infusion 4 mcg/min (03/04/21 0600)   propofol (DIPRIVAN) infusion 20 mcg/kg/min (03/04/21 4010)   vancomycin Stopped (03/04/21 0438)    CT-head-formal reading unremarkable.  On review by Neurology team, there is symmetric effacement of sulci with some loss of gray-white differentiation in the parietotemporal regions bilaterally.  The frontal areas bilaterally look more normal in appearance.   Assessment: 61 year old with above past medical history brought in after being found down in a hotel room of unknown duration.  CPR in the field and in the ER for total about 50 minutes duration prior to ROSC. Right after ROSC, she started having what appeared to be myoclonic jerking. In the ED she was loaded with 1500 mg IV Keppra and also administered Versed. A second loaing dose of Keppra 1500 mg IV was administered after initial Neurology evaluation and she was started on 500 mg BID scheduled dosing. LTM EEG overnight revealed myoclonic seizures every few seconds. She was then administered a third loading dose of 1000 mg Keppra as well as an IV load of VPA 20 mg/kg and started on scheduled VPA at 500 mg BID. Keppra was then increased to 1000 mg IV BID. Propofol drip was also started at that time (3:12 AM)   - Exam on Saturday morning revealed continued myoclonic-like eye-opening throughout the exam  followed by eyelid relaxation. Eyes tonically supraducted with low-amplitude overlapping slow vertical nystagmus. Full-body and limb-myoclonus was not seen on exam Saturday. Several of her brainstem reflexes were absent. Did not exhibit any awareness of external stimuli.  - Exam today: Shows continued unresponsiveness to all external stimuli. Eyes remain supraducted with decreased spontaneous blinking movements relative to yesterday.  - CT head with subtle abnormalities suggestive of anoxic brain injury - Severe anoxic brain injury also suspected given myoclonic jerking present on presentation which also portends a poor prognosis. - EEGs: - AM LTM EEG report from Saturday: Generalized electrographic myoclonic seizures in addition to a burst suppression pattern with generalized highly epileptiform discharges. She has continued to exhibit repetitive nonrhythmic eye opening which showed EEG correlate of myoclonic seizure activity occurring every 2-40 seconds.  - LTM EEG report for this AM: This was an abnormal continuous video EEG due to diffuse slowing, resolving burst-suppression, and absent reactivity. Events of neck/facial myoclonus did not appear to have EEG correlate. This study was indicative of a severe encephalopathy pattern, however background activity has shown mild improvement over the past 24 hours.     Recommendations: - Continue Keppra 1000 mg BID - Attempt to wean off propofol drip -  Blood pressures continue to be soft and are requiring pressors to maintain her blood pressures. - Discontinuing LTM EEG - Exam continues to appear very poor and suggestive of anoxic brain injury. In addition, myoclonus at presentation after cardiac arrest usually portends extremely poor prognosis in terms of a neurologically meaningful recovery. She is on low-dose propofol at the time of exam, but her clinical exam findings cannot be accounted for by this low of a level of sedation.  - MRI brain when able.  -  Neurology will sign off for now. Please call us if a repeat prognostic exam is needed after she has been off all sedation for 72 hours.   35 minutes spent in the neurological evaluation and management of this critically ill patient.    LOS: 2 days   _0  signed: Dr. Kerney Elbe 03/04/2021  7:37 AM

## 2021-03-04 NOTE — Progress Notes (Signed)
PCCM Update:  I updated patient's daughter and family regarding the MRI brain scan which shows extensive anoxic brain injury. I expressed that she would not have meaningful neurologic recovery and the daughter expressed understanding. She knows her mother would not want to be sustained on prolonged life support so decision has been made to transition to comfort measures.   Orders have been placed for transition of care.   Freda [facility], MD [facility] Office: [phone]   See Amion for personal pager PCCM on call pager [phone] until 7pm. Please call Elink 7p-7a. [phone]

## 2021-03-04 NOTE — Progress Notes (Signed)
Spoke with [name] at [employer] and update given at this time.

## 2021-03-04 NOTE — Progress Notes (Signed)
NAME:  Jamie Mercado, MRN:  [id number], DOB:  November 20, 1960, LOS: 2 ADMISSION DATE:  02/28/2021, CONSULTATION DATE:  1/13 REFERRING MD:  Dr. Sherry Ruffing, CHIEF COMPLAINT:  post PEA arrest   History of Present Illness:  Patient is a 61 yo F w/ pertinent PMH of HLD, HTN, GAD, hypothyroidism presents to [facility] ED on 1/13 PEA arrest.  On 1/13, patient was found down for unknown amount of time in hotel room. EMS called and found patient unresponsive. About 45 minutes of CPR was performed for PEA arrest; 4 doses of epi given. Upon arrival to [facility] ED, pulse was obtained. Patient breathing tube exchanged for ETT. Started on epi drip for hypotension. Patient remains unresponsive. EKG sinus tachy; no STEMI appreciated. CT head pending. Labs pending.  PCCM consulted for icu admission.  Pertinent  Medical History   Past Medical History:  Diagnosis Date   Acute gastritis without bleeding Fall 2019   H pylori NEG on EGD   Cervical cancer ([place])    around age 17   Colon polyps    COVID-19 virus infection 07/2020   paxlovid   Essential hypertension    GAD (generalized anxiety disorder)    GERD (gastroesophageal reflux disease)    well controlled with ranitidine bid   Hay fever    Hypercholesterolemia 12/2020   LDL 156-->frmhm risk 7%-->TLC   Hypothyroidism    IBS (irritable bowel syndrome)    Insomnia    Kidney stones    Moderate persistent asthma    Not taking controller meds b/c she said she improved significantly with cutting back on tobacco use.   Psoriasis    Tobacco dependence    ongoing as of 02/2017     Significant Hospital Events: Including procedures, antibiotic start and stop dates in addition to other pertinent events   1/13: admitted to [facility]; post cardiac arrest/intubated  Interim History / Subjective:   Remains intubated.   Propofol stopped this AM per neuro.  cEEG without seizure activity and has been discontinued.   Objective   Blood pressure 130/80, pulse 86, temperature  99 F (37.2 C), temperature source Bladder, resp. rate (!) 32, height 5\' 6"  (1.676 m), weight 77 kg, SpO2 100 %.    Vent Mode: PRVC FiO2 (%):  [65 %-80 %] 65 % Set Rate:  [32 bmp] 32 bmp Vt Set:  [470 mL] 470 mL PEEP:  [8 cmH20] 8 cmH20 Plateau Pressure:  [18 cmH20-20 cmH20] 18 cmH20   Intake/Output Summary (Last 24 hours) at 03/04/2021 0758 Last data filed at 03/04/2021 1287 Gross per 24 hour  Intake 1154.24 ml  Output 1970 ml  Net -815.76 ml   Filed Weights   03/13/2021 2203 03/03/21 0447 03/04/21 0500  Weight: 88.1 kg 75.6 kg 77 kg    Examination: General:  intubated, no acute distress HEENT: MM pink/moist; ETT and OG in place Neuro: Unresponsive; cough/gag reflex present; occasional twitching of eyes CV: s1s2, no m/r/g PULM:  course breath sounds. No wheezing. 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 P: -neuro consulted, poor prognosis -UDS positive for benzos  and cocaine - EtOH level 82 on admission -ammonia mildly elevated -Keppra and depakote per neuro -prn versed for seizure -frequent neuro checks - MRI brain ordered  Elevated LFTs: likely shock liver P: -trend CMP  Hyperglycemia P: -check a1c -SSI and cbg monitoring  Asthma Hx of tobacco abuse P: -prn duoneb for wheezing  HTN HLD P: -hold home antihypertensives -no home statin on file  GAD P: -hold home meds  GERD P: -PPI  Hypothyroidism P: -check TSH -start home synthroid  Polysubstance abuse - utox positive for  etoh, cocaine and benzos on admission  Best Practice (right click and "Reselect all SmartList Selections" daily)   Diet/type: NPO w/ meds via tube DVT prophylaxis: SCD GI prophylaxis: PPI Lines: N/A Foley:  N/A Code Status:  full code Last date of multidisciplinary goals of care discussion [updated daughter, mother and sister at the bedside 1/15]  Labs   CBC: Recent Labs  Lab 02/24/2021 2020 03/08/2021 2042 03/10/2021 2303 03/19/2021 2341 03/03/21 0051 03/03/21 0404  WBC 20.9*  --   --  22.2* 24.0*  --   NEUTROABS 9.6*  --   --   --   --   --   HGB 14.9 14.3 13.3 13.6 13.2 13.3  HCT 46.9* 42.0 39.0 42.7 41.7 39.0  MCV 90.7  --   --  88.8 89.3  --   PLT 235  --   --  307 302  --     Basic Metabolic Panel: Recent Labs  Lab 03/14/2021 2020 03/03/2021 2042 02/23/2021 2303 03/10/2021 2341 03/03/21 0051 03/03/21 0404 03/04/21 0505  NA 137 136 141  --  143 141 141  K 3.5 3.3* 3.2*  --  3.6 3.3* 4.9  CL 103  --   --   --  110  --  108  CO2 17*  --   --   --  17*  --  18*  GLUCOSE 404*  --   --   --  259*  --  100*  BUN 10  --   --   --  11  --  15  CREATININE 1.03*  --   --  1.06* 1.01*  --  0.86  CALCIUM 8.6*  --   --   --  7.6*  --  8.6*  MG 2.9*  --   --   --   --   --   --    GFR: Estimated Creatinine Clearance: 73.8 mL/min (by C-G formula based on SCr of 0.86 mg/dL). Recent Labs  Lab 02/20/2021 2020 03/13/2021 2222 03/06/2021 2341 03/03/21 0051 03/03/21 0102  PROCALCITON  --   --  0.56  --   --   WBC 20.9*  --  22.2* 24.0*  --   LATICACIDVEN >9.0* 7.6*  --   --  5.2*    Liver Function Tests: Recent Labs  Lab 03/13/2021 2020 03/03/21 0051 03/03/21 0529 03/04/21 0505  AST 295* QUANTITY NOT SUFFICIENT, UNABLE TO PERFORM TEST 494* 219*  ALT 309* QUANTITY NOT SUFFICIENT, UNABLE TO PERFORM TEST 370* 261*  ALKPHOS 99 74  --  56  BILITOT 0.6 QUANTITY NOT SUFFICIENT, UNABLE TO PERFORM TEST 0.5 0.9  PROT 6.4* 5.4*  --  5.8*  ALBUMIN 3.7 3.0*  --  3.0*   No results for  input(s): LIPASE, AMYLASE in the last 168 hours. Recent Labs  Lab 02/28/2021 2222 03/04/21 0505  AMMONIA 48* 41*    ABG    Component Value Date/Time  PHART 7.221 (L) 03/03/2021 0404   PCO2ART 43.1 03/03/2021 0404   PO2ART 146 (H) 03/03/2021 0404   HCO3 18.3 (L) 03/03/2021 0404   TCO2 20 (L) 03/03/2021 0404   ACIDBASEDEF 10.0 (H) 03/03/2021 0404   O2SAT 99.0 03/03/2021 0404     Coagulation Profile: No results for input(s): INR, PROTIME in the last 168 hours.  Cardiac Enzymes: No results for input(s): CKTOTAL, CKMB, CKMBINDEX, TROPONINI in the last 168 hours.  HbA1C: Hgb A1c MFr Bld  Date/Time Value Ref Range Status  02/28/2021 10:12 PM 6.0 (H) 4.8 - 5.6 % Final    Comment:    (NOTE) Pre diabetes:          5.7%-6.4%  Diabetes:              >6.4%  Glycemic control for   <7.0% adults with diabetes   01/04/2016 11:40 AM 5.6 <5.7 % Final    Comment:      For the purpose of screening for the presence of diabetes:   <5.7%       Consistent with the absence of diabetes 5.7-6.4 %   Consistent with increased risk for diabetes (prediabetes) >=6.5 %     Consistent with diabetes   This assay result is consistent with a decreased risk of diabetes.   Currently, no consensus exists regarding use of hemoglobin A1c for diagnosis of diabetes in children.   According to American Diabetes Association (ADA) guidelines, hemoglobin A1c <7.0% represents optimal control in non-pregnant diabetic patients. Different metrics may apply to specific patient populations. Standards of Medical Care in Diabetes (ADA).       CBG: Recent Labs  Lab 03/03/21 1518 03/03/21 1918 03/03/21 2324 03/04/21 0341 03/04/21 0730  GLUCAP 80 84 104* 93 95       Critical care time: 35 minutes    Freda Jackson, MD [facility] Office: [phone]   See Amion for personal pager PCCM on call pager [phone] until 7pm. Please call Elink 7p-7a. [phone]

## 2021-03-05 DIAGNOSIS — G931 Anoxic brain damage, not elsewhere classified: Secondary | ICD-10-CM

## 2021-03-05 DIAGNOSIS — Z66 Do not resuscitate: Secondary | ICD-10-CM

## 2021-03-05 DIAGNOSIS — Z515 Encounter for palliative care: Secondary | ICD-10-CM

## 2021-03-05 LAB — CULTURE, RESPIRATORY W GRAM STAIN
Culture: NORMAL
Gram Stain: NONE SEEN

## 2021-03-05 LAB — GLUCOSE, CAPILLARY: Glucose-Capillary: 239 mg/dL — ABNORMAL HIGH (ref 70–99)

## 2021-03-05 MED ORDER — MIDAZOLAM HCL 2 MG/2ML IJ SOLN
2.0000 mg | INTRAMUSCULAR | Status: DC | PRN
  Administered 2021-03-05: 2 mg via INTRAVENOUS
  Filled 2021-03-05: qty 2

## 2021-03-06 LAB — PATHOLOGIST SMEAR REVIEW

## 2021-03-07 LAB — CULTURE, BLOOD (ROUTINE X 2)
Culture: NO GROWTH
Special Requests: ADEQUATE

## 2021-03-08 LAB — CULTURE, BLOOD (ROUTINE X 2): Culture: NO GROWTH

## 2021-03-21 NOTE — Progress Notes (Addendum)
NAME:  Jamie Mercado, MRN:  [id number], DOB:  03/22/1960, LOS: 3 ADMISSION DATE:  03/14/2021, CONSULTATION DATE:  1/13 REFERRING MD:  Dr. Sherry Ruffing, CHIEF COMPLAINT:  post PEA arrest   History of Present Illness:  Patient is a 61 yo F w/ pertinent PMH of HLD, HTN, GAD, hypothyroidism presents to [facility] ED on 1/13 PEA arrest.  On 1/13, patient was found down for unknown amount of time in hotel room. EMS called and found patient unresponsive. About 45 minutes of CPR was performed for PEA arrest; 4 doses of epi given. Upon arrival to [facility] ED, pulse was obtained. Patient breathing tube exchanged for ETT. Started on epi drip for hypotension. Patient remains unresponsive. EKG sinus tachy; no STEMI appreciated. In ED with suspected myoclonus with neck and facial twitching. Neurology Consulted. EEG with burst suppression pattern with generalized highly epileptiform discharges correlating with myoclonic seizures.   UDS +Benzo and Cocaine. ETOH 82. Throughout stay no improvement in neuro examination. MRI 1/15 with extensive hypoxic/ischemic injury. Neurology Following.   Pertinent  Medical History   Past Medical History:  Diagnosis Date   Acute gastritis without bleeding Fall 2019   H pylori NEG on EGD   Cervical cancer ([place])    around age 41   Colon polyps    COVID-19 virus infection 07/2020   paxlovid   Essential hypertension    GAD (generalized anxiety disorder)    GERD (gastroesophageal reflux disease)    well controlled with ranitidine bid   Hay fever    Hypercholesterolemia 12/2020   LDL 156-->frmhm risk 7%-->TLC   Hypothyroidism    IBS (irritable bowel syndrome)    Insomnia    Kidney stones    Moderate persistent asthma    Not taking controller meds b/c she said she improved significantly with cutting back on tobacco use.   Psoriasis    Tobacco dependence    ongoing as of 02/2017    Significant Hospital Events: Including procedures, antibiotic start and stop dates in addition to  other pertinent events   1/13: admitted to [facility]; post cardiac arrest/intubated 1/16: Extubated for comfort measures  Interim History / Subjective:  Extubated earlier this AM for comfort only measures.   Objective   Blood pressure 129/77, pulse 100, temperature 100.2 F (37.9 C), resp. rate (!) 6, height 5\' 6"  (1.676 m), weight 77 kg, SpO2 (!) 65 %.    Vent Mode: PSV;CPAP FiO2 (%):  [40 %-70 %] 40 % Set Rate:  [32 bmp] 32 bmp Vt Set:  [470 mL] 470 mL PEEP:  [5 cmH20-8 cmH20] 5 cmH20 Pressure Support:  [5 cmH20] 5 cmH20 Plateau Pressure:  [21 cmH20-22 cmH20] 21 cmH20   Intake/Output Summary (Last 24 hours) at 03-06-21 0845 Last data filed at 06-Mar-2021 0600 Gross per 24 hour  Intake 367.2 ml  Output 540 ml  Net -172.8 ml   Filed Weights   03/19/2021 2203 03/03/21 0447 03/04/21 0500  Weight: 88.1 kg 75.6 kg 77 kg    Examination: General:  adult female, no distress  HEENT: Dry MM  Neuro: Unresponsive CV: HR 91  PULM:  no use of accessory muscles  GI: non-distended  Extremities: warm/dry Skin: intact    Resolved Hospital Problem list     Assessment & Plan:   Post cardiac arrest Shock Acute respiratory failure with hypercarbia Acute metabolic encephalopathy Myoclonic Seizures Anoxic Brain Injury  Elevated LFTs: likely shock liver Hyperglycemia Asthma Hx of tobacco abuse HTN HLD Generalized Anxiety Disorder  GERD Hypothyroidism Polysubstance  abuse - utox positive for etoh, cocaine and benzos on admission Plan -Comfort Only Measures -Continue Morphine gtt with PRN bolus  -PRN Versed  -Robinul PRN  Dispo: Can transfer to floor if remains stable    Labs   CBC: Recent Labs  Lab 03/12/2021 2020 03/01/2021 2042 03/07/2021 2303 03/14/2021 2341 03/03/21 0051 03/03/21 0404  WBC 20.9*  --   --  22.2* 24.0*  --   NEUTROABS 9.6*  --   --   --   --   --   HGB 14.9 14.3 13.3 13.6 13.2 13.3  HCT 46.9* 42.0 39.0 42.7 41.7 39.0  MCV 90.7  --   --  88.8 89.3  --    PLT 235  --   --  307 302  --     Basic Metabolic Panel: Recent Labs  Lab 03/06/2021 2020 02/18/2021 2042 03/12/2021 2303 02/22/2021 2341 03/03/21 0051 03/03/21 0404 03/04/21 0505  NA 137 136 141  --  143 141 141  K 3.5 3.3* 3.2*  --  3.6 3.3* 4.9  CL 103  --   --   --  110  --  108  CO2 17*  --   --   --  17*  --  18*  GLUCOSE 404*  --   --   --  259*  --  100*  BUN 10  --   --   --  11  --  15  CREATININE 1.03*  --   --  1.06* 1.01*  --  0.86  CALCIUM 8.6*  --   --   --  7.6*  --  8.6*  MG 2.9*  --   --   --   --   --   --    GFR: Estimated Creatinine Clearance: 73.8 mL/min (by C-G formula based on SCr of 0.86 mg/dL). Recent Labs  Lab 02/24/2021 2020 02/18/2021 2222 02/24/2021 2341 03/03/21 0051 03/03/21 0102 03/04/21 1440 03/04/21 1719  PROCALCITON  --   --  0.56  --   --   --   --   WBC 20.9*  --  22.2* 24.0*  --   --   --   LATICACIDVEN >9.0* 7.6*  --   --  5.2* 3.2* 1.5    Liver Function Tests: Recent Labs  Lab 03/10/2021 2020 03/03/21 0051 03/03/21 0529 03/04/21 0505  AST 295* QUANTITY NOT SUFFICIENT, UNABLE TO PERFORM TEST 494* 219*  ALT 309* QUANTITY NOT SUFFICIENT, UNABLE TO PERFORM TEST 370* 261*  ALKPHOS 99 74  --  56  BILITOT 0.6 QUANTITY NOT SUFFICIENT, UNABLE TO PERFORM TEST 0.5 0.9  PROT 6.4* 5.4*  --  5.8*  ALBUMIN 3.7 3.0*  --  3.0*   No results for input(s): LIPASE, AMYLASE in the last 168 hours. Recent Labs  Lab 03/19/2021 2222 03/04/21 0505  AMMONIA 48* 41*    ABG    Component Value Date/Time   PHART 7.221 (L) 03/03/2021 0404   PCO2ART 43.1 03/03/2021 0404   PO2ART 146 (H) 03/03/2021 0404   HCO3 18.3 (L) 03/03/2021 0404   TCO2 20 (L) 03/03/2021 0404   ACIDBASEDEF 10.0 (H) 03/03/2021 0404   O2SAT 99.0 03/03/2021 0404     Coagulation Profile: No results for input(s): INR, PROTIME in the last 168 hours.  Cardiac Enzymes: No results for input(s): CKTOTAL, CKMB, CKMBINDEX, TROPONINI in the last 168 hours.  HbA1C: Hgb A1c MFr Bld   Date/Time Value Ref Range Status  03/01/2021 10:12 PM 6.0 (  H) 4.8 - 5.6 % Final    Comment:    (NOTE) Pre diabetes:          5.7%-6.4%  Diabetes:              >6.4%  Glycemic control for   <7.0% adults with diabetes   01/04/2016 11:40 AM 5.6 <5.7 % Final    Comment:      For the purpose of screening for the presence of diabetes:   <5.7%       Consistent with the absence of diabetes 5.7-6.4 %   Consistent with increased risk for diabetes (prediabetes) >=6.5 %     Consistent with diabetes   This assay result is consistent with a decreased risk of diabetes.   Currently, no consensus exists regarding use of hemoglobin A1c for diagnosis of diabetes in children.   According to American Diabetes Association (ADA) guidelines, hemoglobin A1c <7.0% represents optimal control in non-pregnant diabetic patients. Different metrics may apply to specific patient populations. Standards of Medical Care in Diabetes (ADA).       CBG: Recent Labs  Lab 03/03/21 2324 03/04/21 0341 03/04/21 0730 03/04/21 1139 03/04/21 1530  GLUCAP 104* 93 95 92 110*    Hayden Pedro, AGACNP-BC [facility]  PCCM Pgr: [phone]

## 2021-03-21 NOTE — Significant Event (Signed)
Patient asystole. Absent heart sounds. Time of death 05-22-16.

## 2021-03-21 NOTE — Progress Notes (Signed)
Per Izora Ribas M.E. ok to extubate patient.

## 2021-03-21 NOTE — Procedures (Signed)
Extubation Procedure Note  Patient Details:   Name: Jamie Mercado DOB: Oct 05, 1960 MRN: [id number]   Airway Documentation:    Vent end date: March 31, 2021 Vent end time: 0306   Patient terminally extubated to RA.   Evaluation  O2 sats: currently acceptable Complications: No apparent complications Patient did not tolerate procedure well. Bilateral Breath Sounds: Diminished   No  Kelle Darting Mar 31, 2021, [phone]

## 2021-03-21 NOTE — Death Summary Note (Signed)
DEATH SUMMARY   Patient Details  Name: Jamie Mercado MRN: [id number] DOB: 1960/12/09  Admission/Discharge Information   Admit Date:  03/23/2021  Date of Death:  March 26, 2021  Time of Death:  1216/05/24  Length of Stay: 3  Referring Physician: Tammi Sou, MD   Reason(s) for Hospitalization  Cardiac Arrest Acute Overdose  Anoxic Brain Injury   Diagnoses  Preliminary cause of death:  Secondary Diagnoses (including complications and co-morbidities):  Principal Problem:   Cardiac arrest [facility])   Brief Hospital Course (including significant findings, care, treatment, and services provided and events leading to death)  Patient is a 61 yo F w/ pertinent PMH of HLD, HTN, GAD, hypothyroidism presents to [facility] ED on Mar 23, 2022 PEA arrest.   On 03-23-2022, patient was found down for unknown amount of time in hotel room. EMS called and found patient unresponsive. About 45 minutes of CPR was performed for PEA arrest; 4 doses of epi given. Upon arrival to [facility] ED, pulse was obtained. Patient breathing tube exchanged for ETT. Started on epi drip for hypotension. Patient remains unresponsive. EKG sinus tachy; no STEMI appreciated. In ED with suspected myoclonus with neck and facial twitching. Neurology Consulted. EEG with burst suppression pattern with generalized highly epileptiform discharges correlating with myoclonic seizures.    UDS +Benzo and Cocaine. ETOH 82. Throughout stay no improvement in neuro examination. MRI 1/15 with extensive hypoxic/ischemic injury. Decision made 03/26/22 with family at bedside to transition to comfort care. Patient expired 2022-03-26 05-24-16   Pertinent Labs and Studies  Significant Diagnostic Studies DG Abd 1 View  Result Date: 03/23/2021 CLINICAL DATA:  OG tube placement. EXAM: ABDOMEN - 1 VIEW COMPARISON:  None. FINDINGS: Multiple overlying radiopaque lead wires are noted. An orogastric tube is seen with its distal tip noted within the body of the stomach. The bowel gas pattern is  normal. No radio-opaque calculi or other significant radiographic abnormality are seen. IMPRESSION: Orogastric tube positioning, as described above. Electronically Signed   By: Virgina Norfolk M.D.   On: 03/23/2021 20:57   CT Head Wo Contrast  Result Date: 03/23/2021 CLINICAL DATA:  Recent arrest with CPR EXAM: CT HEAD WITHOUT CONTRAST TECHNIQUE: Contiguous axial images were obtained from the base of the skull through the vertex without intravenous contrast. RADIATION DOSE REDUCTION: This exam was performed according to the departmental dose-optimization program which includes automated exposure control, adjustment of the mA and/or kV according to patient size and/or use of iterative reconstruction technique. COMPARISON:  MRI from 11/23/2020 FINDINGS: Brain: No evidence of acute infarction, hemorrhage, hydrocephalus, extra-axial collection or mass lesion/mass effect. Vascular: No hyperdense vessel or unexpected calcification. Skull: Normal. Negative for fracture or focal lesion. Sinuses/Orbits: Mucosal thickening in the nasal passages is noted. Paranasal sinuses appear within normal limits. Orbits and their contents are unremarkable. Other: None IMPRESSION: No acute intracranial abnormality noted. Electronically Signed   By: Inez Catalina M.D.   On: 2021-03-23 21:34   CT CERVICAL SPINE WO CONTRAST  Result Date: 03/04/2021 CLINICAL DATA:  History of recent cardiac arrest with fall and neck pain, initial encounter EXAM: CT CERVICAL SPINE WITHOUT CONTRAST TECHNIQUE: Multidetector CT imaging of the cervical spine was performed without intravenous contrast. Multiplanar CT image reconstructions were also generated. RADIATION DOSE REDUCTION: This exam was performed according to the departmental dose-optimization program which includes automated exposure control, adjustment of the mA and/or kV according to patient size and/or use of iterative reconstruction technique. COMPARISON:  None. FINDINGS: Alignment: Within  normal limits. Skull base and vertebrae:  7 cervical segments are well visualized. Vertebral body height is well maintained. Multilevel facet hypertrophic changes are noted. Disc space narrowing is noted at C5-6 and C6-7 with associated osteophytic changes. No acute fracture or acute facet abnormality is noted. The odontoid is within normal limits. Soft tissues and spinal canal: Surrounding soft tissue structures are within normal limits. No focal hematoma is noted. Endotracheal tube and nasogastric catheter are seen. Upper chest: Visualized lung apices are within normal limits. Other: None IMPRESSION: Degenerative changes as described without acute abnormality. Electronically Signed   By: Inez Catalina M.D.   On: 03/04/2021 00:16   MR BRAIN WO CONTRAST  Result Date: 03/04/2021 CLINICAL DATA:  Mental status change, unknown cause; PEA arrest EXAM: MRI HEAD WITHOUT CONTRAST TECHNIQUE: Multiplanar, multiecho pulse sequences of the brain and surrounding structures were obtained without intravenous contrast. COMPARISON:  11/23/2020 FINDINGS: Brain: There is abnormal cortical reduced diffusion throughout the cerebrum including the hippocampi. There is also abnormal diffusion signal involving the central gray nuclei. There is also likely involvement of the cerebellum. Ventricles and sulci are normal in size and configuration though less prominent than in October 2022, which may reflect presence of cerebral edema. No evidence of recent hemorrhage. Vascular: Major vessel flow voids at the skull base are preserved. Skull and upper cervical spine: Normal marrow signal is preserved. Sinuses/Orbits: Nonspecific paranasal sinus mucosal thickening. Orbits are unremarkable. Other: Sella is unremarkable.  Nonspecific mastoid effusions. IMPRESSION: Extensive hypoxic/ischemic injury. Electronically Signed   By: Macy Mis M.D.   On: 03/04/2021 17:25   DG Chest Port 1 View  Result Date: 02/27/2021 CLINICAL DATA:  Status post  CPR. EXAM: PORTABLE CHEST 1 VIEW COMPARISON:  January 25, 2021 FINDINGS: Overlying radiopaque tags and cardiac lead wires are present. An endotracheal tube is seen with its distal tip approximately 4.2 cm from the carina. A nasogastric tube is noted with its distal end extending into the body of the stomach. Its side hole sits approximately 5.6 cm distal to the expected region of the gastroesophageal junction. Mild, diffusely increased lung markings are noted, bilaterally. There is no evidence of focal consolidation, pleural effusion or pneumothorax. The heart size and mediastinal contours are within normal limits. Lateral seventh and ninth left rib fractures are seen. IMPRESSION: 1. Endotracheal tube and nasogastric tube in place as described above. 2. Mild, diffusely increased lung markings, bilaterally, without evidence of focal consolidation or pneumothorax. 3. Lateral seventh and ninth left rib fractures. Electronically Signed   By: Virgina Norfolk M.D.   On: 02/26/2021 20:56   EEG adult  Result Date: 03/03/2021 Lora Havens, MD     03/03/2021  2:59 AM Patient Name: Quincey Nored MRN: [id number] Epilepsy Attending: Lora Havens Referring Physician/Provider: Amie Portland, MD Date: 03/03/2021 Duration: 24.28 mins Patient history: 61 year old with above past medical history brought in after being found down in a hotel room of unknown duration.  CPR in the field and in the ER for total about 50 minutes duration prior to ROSC.  Right after ROSC, started having what appears to be myoclonic jerking. EEG to evaluate for seizure Level of alertness:  comatose AEDs during EEG study: LEV Technical aspects: This EEG study was done with scalp electrodes positioned according to the 10-20 International system of electrode placement. Electrical activity was acquired at a sampling rate of 500Hz  and reviewed with a high frequency filter of 70Hz  and a low frequency filter of 1Hz . EEG data were recorded continuously  and digitally stored. Description: Patient  was noted to have episodes of spontaneous brief eye opening every 2 to 40 seconds . Concomitant eeg showed generalized polyspikes consistent with myoclonic seizure. In between myoclonic seizure, EEG showed continuous generalized background suppression. Hyperventilation and photic stimulation were not performed.   ABNORMALITY - Myoclonic seizure, generalized - EEG suppression, generalized IMPRESSION: This study showed episodes of brief spontaneous eye opening consistent with myoclonic seizure every 2 to 40 seconds. There is also profound diffuse encephalopathy. 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 Dr Rory Percy was notified. Priyanka [name]   Overnight EEG with video  Result Date: 03/03/2021 Lora Havens, MD     03/03/2021  9:43 AM Patient Name: Avenell Sellers MRN: [id number] Epilepsy Attending: Lora Havens Referring Physician/Provider: Amie Portland, MD Duration: 03/03/2021 0239 to 0730  Patient history: 61 year old with above past medical history brought in after being found down in a hotel room of unknown duration.  CPR in the field and in the ER for total about 50 minutes duration prior to ROSC.  Right after ROSC, started having what appears to be myoclonic jerking. EEG to evaluate for seizure  Level of alertness: comatose  AEDs during EEG study: LEV, propofol  Technical aspects: This EEG study was done with scalp electrodes positioned according to the 10-20 International system of electrode placement. Electrical activity was acquired at a sampling rate of 500Hz  and reviewed with a high frequency filter of 70Hz  and a low frequency filter of 1Hz . EEG data were recorded continuously and digitally stored.  Description: At the beginning of study, patient was noted to have episodes of spontaneous brief eye opening every 2 to 40 seconds . Concomitant eeg showed generalized polyspikes consistent with myoclonic  seizure. Gradually, myoclonic seizures resolved and eeg showed burst suppression pattern with generalized highly epileptiform bursts lasting 5-15 seconds alternating with about 1 minute of generalized eeg suppression.  Hyperventilation and photic stimulation were not performed.    ABNORMALITY - Myoclonic seizure, generalized - Burst suppression with highly epileptiform discharges, generalized   IMPRESSION: This study initially showed episodes of brief spontaneous eye opening consistent with myoclonic seizure every 2 to 40 seconds. Gradually as medications were adjusted, EEG showed evidence of epileptogenicity with generalized onset and high potential for seizure recurrence. Additionally, EEG was suggestive of  profound diffuse encephalopathy. 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 Lora Havens    Microbiology Recent Results (from the past 240 hour(s))  Resp Panel by RT-PCR (Flu A&B, Covid) Nasopharyngeal Swab     Status: None   Collection Time: 03/12/2021  8:31 PM   Specimen: Nasopharyngeal Swab; Nasopharyngeal(NP) swabs in vial transport medium  Result Value Ref Range Status   SARS Coronavirus 2 by RT PCR NEGATIVE NEGATIVE Final    Comment: (NOTE) SARS-CoV-2 target nucleic acids are NOT DETECTED.  The SARS-CoV-2 RNA is generally detectable in upper respiratory specimens during the acute phase of infection. The lowest concentration of SARS-CoV-2 viral copies this assay can detect is 138 copies/mL. A negative result does not preclude SARS-Cov-2 infection and should not be used as the sole basis for treatment or other patient management decisions. A negative result may occur with  improper specimen collection/handling, submission of specimen other than nasopharyngeal swab, presence of viral mutation(s) within the areas targeted by this assay, and inadequate number of viral copies(<138 copies/mL). A negative result must be combined  with clinical observations, patient history, and epidemiological information. The expected  result is Negative.  Fact Sheet for Patients:  EntrepreneurPulse.com.au  Fact Sheet for Healthcare Providers:  IncredibleEmployment.be  This test is no t yet approved or cleared by the [place] FDA and  has been authorized for detection and/or diagnosis of SARS-CoV-2 by FDA under an Emergency Use Authorization (EUA). This EUA will remain  in effect (meaning this test can be used) for the duration of the COVID-19 declaration under Section 564(b)(1) of the Act, 21 U.S.C.section 360bbb-3(b)(1), unless the authorization is terminated  or revoked sooner.       Influenza A by PCR NEGATIVE NEGATIVE Final   Influenza B by PCR NEGATIVE NEGATIVE Final    Comment: (NOTE) The Xpert Xpress SARS-CoV-2/FLU/RSV plus assay is intended as an aid in the diagnosis of influenza from Nasopharyngeal swab specimens and should not be used as a sole basis for treatment. Nasal washings and aspirates are unacceptable for Xpert Xpress SARS-CoV-2/FLU/RSV testing.  Fact Sheet for Patients: EntrepreneurPulse.com.au  Fact Sheet for Healthcare Providers: IncredibleEmployment.be  This test is not yet approved or cleared by the [place] FDA and has been authorized for detection and/or diagnosis of SARS-CoV-2 by FDA under an Emergency Use Authorization (EUA). This EUA will remain in effect (meaning this test can be used) for the duration of the COVID-19 declaration under Section 564(b)(1) of the Act, 21 U.S.C. section 360bbb-3(b)(1), unless the authorization is terminated or revoked.  Performed at [facility], [place] [address] [place], [place] [address]   Culture, Respiratory w Gram Stain (tracheal aspirate)     Status: None   Collection Time: 03/07/2021 10:09 PM   Specimen: Tracheal Aspirate; Respiratory  Result Value Ref Range  Status   Specimen Description TRACHEAL ASPIRATE  Final   Special Requests NONE  Final   Gram Stain   Final    NO SQUAMOUS EPITHELIAL CELLS SEEN FEW WBC SEEN NO ORGANISMS SEEN    Culture   Final    RARE Normal respiratory flora-no Staph aureus or Pseudomonas seen Performed at [facility], [address], [place], [place] [address]    Report Status 03/09/2021 FINAL  Final  Culture, blood (routine x 2)     Status: None (Preliminary result)   Collection Time: 03/18/2021 10:14 PM   Specimen: BLOOD RIGHT HAND  Result Value Ref Range Status   Specimen Description BLOOD RIGHT HAND  Final   Special Requests   Final    BOTTLES DRAWN AEROBIC AND ANAEROBIC Blood Culture adequate volume   Culture   Final    NO GROWTH 3 DAYS Performed at [facility], [place] [address] [place], [place] [address]    Report Status PENDING  Incomplete  MRSA Next Gen by PCR, Nasal     Status: Abnormal   Collection Time: 03/19/2021 11:33 PM   Specimen: Nasal Mucosa; Nasal Swab  Result Value Ref Range Status   MRSA by PCR Next Gen DETECTED (A) NOT DETECTED Final    Comment: RESULT CALLED TO, READ BACK BY AND VERIFIED WITH: RN MICHELLE T. 03/03/21@1 :28 BY TW (NOTE) The GeneXpert MRSA Assay (FDA approved for NASAL specimens only), is one component of a comprehensive MRSA colonization surveillance program. It is not intended to diagnose MRSA infection nor to guide or monitor treatment for MRSA infections. Test performance is not FDA approved in patients less than 52 years old. Performed at [facility], [place] [address] [place],  [address]   Culture, blood (routine x 2)  Status: None (Preliminary result)   Collection Time: 03/03/21 12:08 AM   Specimen: BLOOD RIGHT HAND  Result Value Ref Range Status   Specimen Description BLOOD RIGHT HAND  Final   Special Requests   Final    BOTTLES DRAWN AEROBIC AND ANAEROBIC Blood Culture results may not be optimal due to an inadequate volume of blood  received in culture bottles   Culture   Final    NO GROWTH 2 DAYS Performed at [facility], [place] [address] [place], [place] [address]    Report Status PENDING  Incomplete    Lab Basic Metabolic Panel: Recent Labs  Lab 02/20/2021 2020 02/19/2021 2042 03/04/2021 2303 02/25/2021 2341 03/03/21 0051 03/03/21 0404 03/04/21 0505  NA 137 136 141  --  143 141 141  K 3.5 3.3* 3.2*  --  3.6 3.3* 4.9  CL 103  --   --   --  110  --  108  CO2 17*  --   --   --  17*  --  18*  GLUCOSE 404*  --   --   --  259*  --  100*  BUN 10  --   --   --  11  --  15  CREATININE 1.03*  --   --  1.06* 1.01*  --  0.86  CALCIUM 8.6*  --   --   --  7.6*  --  8.6*  MG 2.9*  --   --   --   --   --   --    Liver Function Tests: Recent Labs  Lab 03/01/2021 2020 03/03/21 0051 03/03/21 0529 03/04/21 0505  AST 295* QUANTITY NOT SUFFICIENT, UNABLE TO PERFORM TEST 494* 219*  ALT 309* QUANTITY NOT SUFFICIENT, UNABLE TO PERFORM TEST 370* 261*  ALKPHOS 99 74  --  56  BILITOT 0.6 QUANTITY NOT SUFFICIENT, UNABLE TO PERFORM TEST 0.5 0.9  PROT 6.4* 5.4*  --  5.8*  ALBUMIN 3.7 3.0*  --  3.0*   No results for input(s): LIPASE, AMYLASE in the last 168 hours. Recent Labs  Lab 03/09/2021 2222 03/04/21 0505  AMMONIA 48* 41*   CBC: Recent Labs  Lab 03/16/2021 2020 03/01/2021 2042 03/20/2021 2303 03/13/2021 2341 03/03/21 0051 03/03/21 0404  WBC 20.9*  --   --  22.2* 24.0*  --   NEUTROABS 9.6*  --   --   --   --   --   HGB 14.9 14.3 13.3 13.6 13.2 13.3  HCT 46.9* 42.0 39.0 42.7 41.7 39.0  MCV 90.7  --   --  88.8 89.3  --   PLT 235  --   --  307 302  --    Cardiac Enzymes: No results for input(s): CKTOTAL, CKMB, CKMBINDEX, TROPONINI in the last 168 hours. Sepsis Labs: Recent Labs  Lab 03/15/2021 2020 02/19/2021 2222 03/20/2021 2341 03/03/21 0051 03/03/21 0102 03/04/21 1440 03/04/21 1719  PROCALCITON  --   --  0.56  --   --   --   --   WBC 20.9*  --  22.2* 24.0*  --   --   --   LATICACIDVEN >9.0* 7.6*  --   --  5.2*  3.2* 1.5

## 2021-03-21 DEATH — deceased
